# Patient Record
Sex: Male | Born: 1950 | Race: Black or African American | Hispanic: No | Smoking: Current some day smoker
Health system: Southern US, Community
[De-identification: ages and names within clinical notes are randomized; demographics above are authoritative.]

## PROBLEM LIST (undated history)

## (undated) DIAGNOSIS — I2510 Atherosclerotic heart disease of native coronary artery without angina pectoris: Secondary | ICD-10-CM

## (undated) DIAGNOSIS — I10 Essential (primary) hypertension: Secondary | ICD-10-CM

## (undated) DIAGNOSIS — F1010 Alcohol abuse, uncomplicated: Secondary | ICD-10-CM

## (undated) DIAGNOSIS — I5020 Unspecified systolic (congestive) heart failure: Secondary | ICD-10-CM

## (undated) DIAGNOSIS — I639 Cerebral infarction, unspecified: Secondary | ICD-10-CM

## (undated) DIAGNOSIS — C61 Malignant neoplasm of prostate: Secondary | ICD-10-CM

## (undated) DIAGNOSIS — E119 Type 2 diabetes mellitus without complications: Secondary | ICD-10-CM

## (undated) DIAGNOSIS — E785 Hyperlipidemia, unspecified: Secondary | ICD-10-CM

## (undated) DIAGNOSIS — F1410 Cocaine abuse, uncomplicated: Secondary | ICD-10-CM

## (undated) DIAGNOSIS — Z720 Tobacco use: Secondary | ICD-10-CM

## (undated) DIAGNOSIS — C801 Malignant (primary) neoplasm, unspecified: Secondary | ICD-10-CM

## (undated) HISTORY — DX: Type 2 diabetes mellitus without complications: E11.9

## (undated) HISTORY — DX: Hyperlipidemia, unspecified: E78.5

## (undated) HISTORY — PX: NO PAST SURGERIES: SHX2092

## (undated) HISTORY — DX: Essential (primary) hypertension: I10

---

## 2007-04-22 ENCOUNTER — Emergency Department: Payer: Self-pay | Admitting: Emergency Medicine

## 2009-03-20 ENCOUNTER — Emergency Department: Payer: Self-pay | Admitting: Emergency Medicine

## 2010-08-21 ENCOUNTER — Emergency Department (HOSPITAL_COMMUNITY)
Admission: EM | Admit: 2010-08-21 | Discharge: 2010-08-22 | Payer: Self-pay | Source: Home / Self Care | Admitting: Emergency Medicine

## 2011-01-31 ENCOUNTER — Emergency Department: Payer: Self-pay | Admitting: Internal Medicine

## 2012-07-16 ENCOUNTER — Emergency Department: Payer: Self-pay | Admitting: Emergency Medicine

## 2012-09-29 ENCOUNTER — Emergency Department: Payer: Self-pay | Admitting: Emergency Medicine

## 2012-09-29 LAB — BASIC METABOLIC PANEL
Anion Gap: 8 (ref 7–16)
BUN: 16 mg/dL (ref 7–18)
Calcium, Total: 9.1 mg/dL (ref 8.5–10.1)
Chloride: 107 mmol/L (ref 98–107)
Co2: 25 mmol/L (ref 21–32)
Osmolality: 283 (ref 275–301)
Potassium: 4.4 mmol/L (ref 3.5–5.1)

## 2012-09-29 LAB — CK TOTAL AND CKMB (NOT AT ARMC): CK, Total: 301 U/L — ABNORMAL HIGH (ref 35–232)

## 2012-09-29 LAB — CBC
HCT: 47.3 % (ref 40.0–52.0)
MCHC: 33.9 g/dL (ref 32.0–36.0)
RDW: 12.6 % (ref 11.5–14.5)

## 2012-12-26 ENCOUNTER — Emergency Department: Payer: Self-pay | Admitting: Emergency Medicine

## 2012-12-26 LAB — BASIC METABOLIC PANEL
BUN: 16 mg/dL (ref 7–18)
Calcium, Total: 8.7 mg/dL (ref 8.5–10.1)
Chloride: 100 mmol/L (ref 98–107)
Co2: 31 mmol/L (ref 21–32)
EGFR (African American): >60
EGFR (Non-African Amer.): >60
Glucose: 316 mg/dL — ABNORMAL HIGH (ref 65–99)
Potassium: 4.3 mmol/L (ref 3.5–5.1)

## 2012-12-26 LAB — CBC
HCT: 42.1 % (ref 40.0–52.0)
HGB: 14.3 g/dL (ref 13.0–18.0)
MCHC: 33.9 g/dL (ref 32.0–36.0)
MCV: 91 fL (ref 80–100)
Platelet: 220 10*3/uL (ref 150–440)
RBC: 4.65 10*6/uL (ref 4.40–5.90)
RDW: 12.6 % (ref 11.5–14.5)

## 2012-12-26 LAB — TROPONIN I: Troponin-I: <0.02 ng/mL

## 2012-12-26 LAB — CK TOTAL AND CKMB (NOT AT ARMC)
CK, Total: 215 U/L (ref 35–232)
CK-MB: 2 ng/mL (ref 0.5–3.6)

## 2013-01-24 ENCOUNTER — Emergency Department: Payer: Self-pay | Admitting: Emergency Medicine

## 2014-02-04 ENCOUNTER — Emergency Department: Payer: Self-pay | Admitting: Internal Medicine

## 2014-05-10 ENCOUNTER — Inpatient Hospital Stay: Payer: Self-pay | Admitting: Internal Medicine

## 2014-05-10 DIAGNOSIS — I6789 Other cerebrovascular disease: Secondary | ICD-10-CM

## 2014-05-10 LAB — COMPREHENSIVE METABOLIC PANEL
ALBUMIN: 3.2 g/dL — AB (ref 3.4–5.0)
ALK PHOS: 61 U/L
ALT: 19 U/L
Anion Gap: 6 — ABNORMAL LOW (ref 7–16)
BILIRUBIN TOTAL: 1.1 mg/dL — AB (ref 0.2–1.0)
BUN: 9 mg/dL (ref 7–18)
CALCIUM: 8.6 mg/dL (ref 8.5–10.1)
CO2: 24 mmol/L (ref 21–32)
Chloride: 110 mmol/L — ABNORMAL HIGH (ref 98–107)
Creatinine: 1.08 mg/dL (ref 0.60–1.30)
EGFR (African American): >60
EGFR (Non-African Amer.): >60
GLUCOSE: 157 mg/dL — AB (ref 65–99)
Osmolality: 281 (ref 275–301)
Potassium: 3.9 mmol/L (ref 3.5–5.1)
SGOT(AST): 26 U/L (ref 15–37)
SODIUM: 140 mmol/L (ref 136–145)
TOTAL PROTEIN: 7.1 g/dL (ref 6.4–8.2)

## 2014-05-10 LAB — CBC
HCT: 53.3 % — ABNORMAL HIGH (ref 40.0–52.0)
HGB: 17.6 g/dL (ref 13.0–18.0)
MCH: 31.9 pg (ref 26.0–34.0)
MCHC: 32.9 g/dL (ref 32.0–36.0)
MCV: 97 fL (ref 80–100)
Platelet: 209 10*3/uL (ref 150–440)
RBC: 5.51 10*6/uL (ref 4.40–5.90)
RDW: 13.6 % (ref 11.5–14.5)
WBC: 6.8 10*3/uL (ref 3.8–10.6)

## 2014-05-10 LAB — TROPONIN I: Troponin-I: <0.02 ng/mL

## 2014-05-11 LAB — CBC WITH DIFFERENTIAL/PLATELET
BASOS ABS: 0 10*3/uL (ref 0.0–0.1)
Basophil %: 0.6 %
Eosinophil #: 0.1 10*3/uL (ref 0.0–0.7)
Eosinophil %: 2.2 %
HCT: 52.9 % — ABNORMAL HIGH (ref 40.0–52.0)
HGB: 17.7 g/dL (ref 13.0–18.0)
LYMPHS ABS: 2.1 10*3/uL (ref 1.0–3.6)
Lymphocyte %: 31.2 %
MCH: 32.2 pg (ref 26.0–34.0)
MCHC: 33.5 g/dL (ref 32.0–36.0)
MCV: 96 fL (ref 80–100)
MONOS PCT: 7.4 %
Monocyte #: 0.5 x10 3/mm (ref 0.2–1.0)
NEUTROS ABS: 4 10*3/uL (ref 1.4–6.5)
Neutrophil %: 58.6 %
Platelet: 225 10*3/uL (ref 150–440)
RBC: 5.5 10*6/uL (ref 4.40–5.90)
RDW: 13.8 % (ref 11.5–14.5)
WBC: 6.8 10*3/uL (ref 3.8–10.6)

## 2014-05-11 LAB — HEMOGLOBIN A1C: Hemoglobin A1C: 8.1 % — ABNORMAL HIGH (ref 4.2–6.3)

## 2014-05-11 LAB — BASIC METABOLIC PANEL
Anion Gap: 7 (ref 7–16)
BUN: 10 mg/dL (ref 7–18)
CALCIUM: 8.6 mg/dL (ref 8.5–10.1)
CHLORIDE: 106 mmol/L (ref 98–107)
Co2: 26 mmol/L (ref 21–32)
Creatinine: 1.07 mg/dL (ref 0.60–1.30)
EGFR (African American): >60
Glucose: 137 mg/dL — ABNORMAL HIGH (ref 65–99)
OSMOLALITY: 279 (ref 275–301)
POTASSIUM: 3.5 mmol/L (ref 3.5–5.1)
SODIUM: 139 mmol/L (ref 136–145)

## 2014-05-11 LAB — URINALYSIS, COMPLETE
BILIRUBIN, UR: NEGATIVE
Bacteria: NONE SEEN
Glucose,UR: NEGATIVE mg/dL (ref 0–75)
Ketone: NEGATIVE
Leukocyte Esterase: NEGATIVE
Nitrite: NEGATIVE
PH: 6 (ref 4.5–8.0)
PROTEIN: NEGATIVE
RBC,UR: <1 /HPF (ref 0–5)
Specific Gravity: 1.013 (ref 1.003–1.030)
Squamous Epithelial: NONE SEEN

## 2014-05-11 LAB — LIPID PANEL
Cholesterol: 162 mg/dL (ref 0–200)
HDL Cholesterol: 37 mg/dL — ABNORMAL LOW (ref 40–60)
LDL CHOLESTEROL, CALC: 105 mg/dL — AB (ref 0–100)
Triglycerides: 98 mg/dL (ref 0–200)
VLDL CHOLESTEROL, CALC: 20 mg/dL (ref 5–40)

## 2014-05-11 IMAGING — CT CT ANGIOGRAPHY NECK
2 of 7 series · 9 of 33 positions shown · IV contrast (APPLIED)
Comparison: MRI brain [DATE].  Carotid ultrasound [DATE].

CLINICAL DATA: Left MCA infarct.  Left carotid artery occlusion.

EXAM:
CT ANGIOGRAPHY NECK
TECHNIQUE: Multidetector CT imaging of the neck was performed using the
standard protocol during bolus administration of intravenous
contrast. Multiplanar CT image reconstructions and MIPs were
obtained to evaluate the vascular anatomy. Carotid stenosis
measurements (when applicable) are obtained utilizing NASCET
criteria, using the distal internal carotid diameter as the
denominator.
CONTRAST:  80 mL Isovue 370

[Series 4: cta neck · axial · 0.43mm/px · z∈[+260,+423]mm · 5 of 245 slices shown]
[im 41/245  soft-tissue]
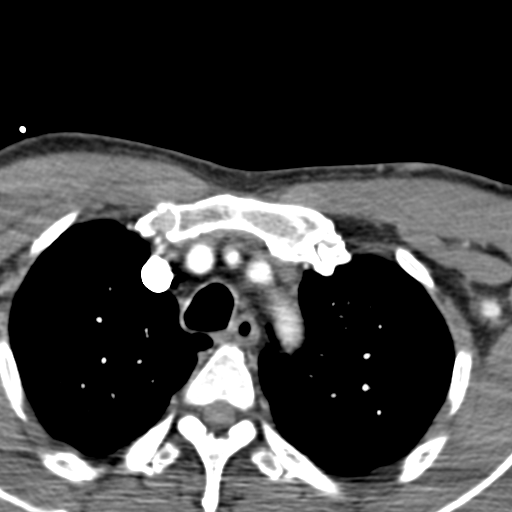
[im 82/245  bone]
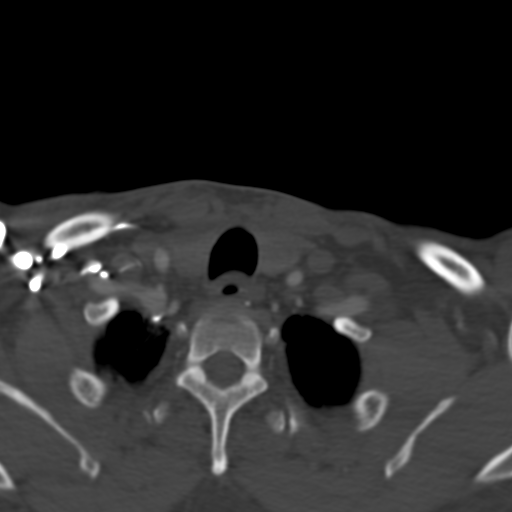
[im 123/245  soft-tissue]
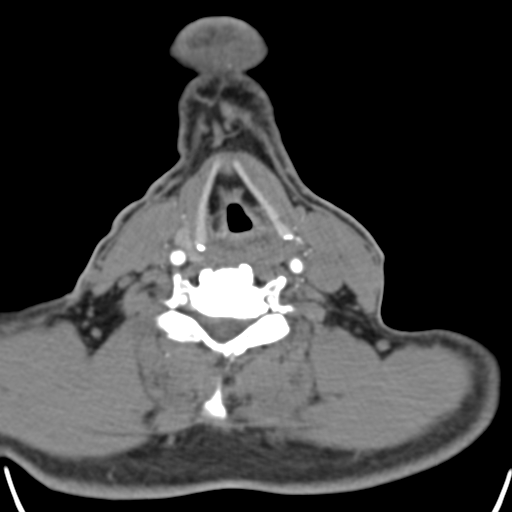
[im 163/245  bone]
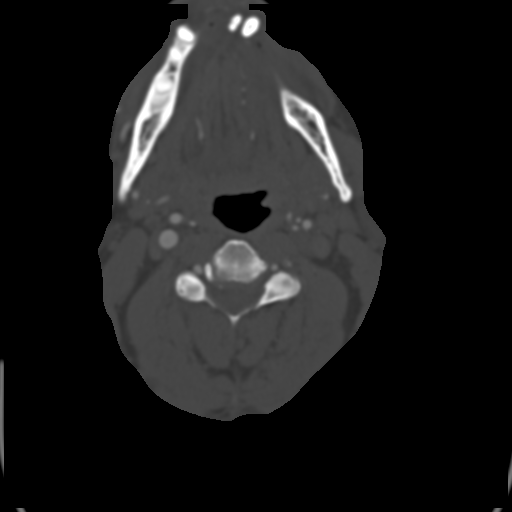
[im 204/245  soft-tissue]
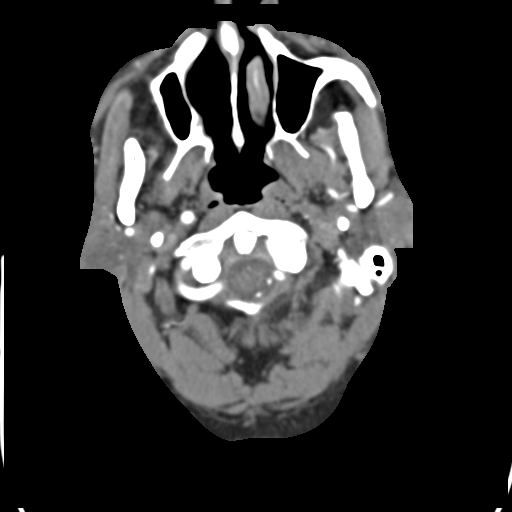

[Series 6: ax thin · axial · 0.40mm/px · z∈[+269,+414]mm · 4 of 243 slices shown]
[im 49/243  soft-tissue]
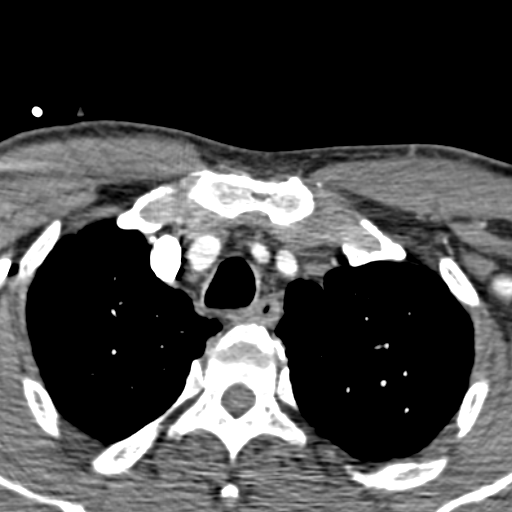
[im 97/243  soft-tissue]
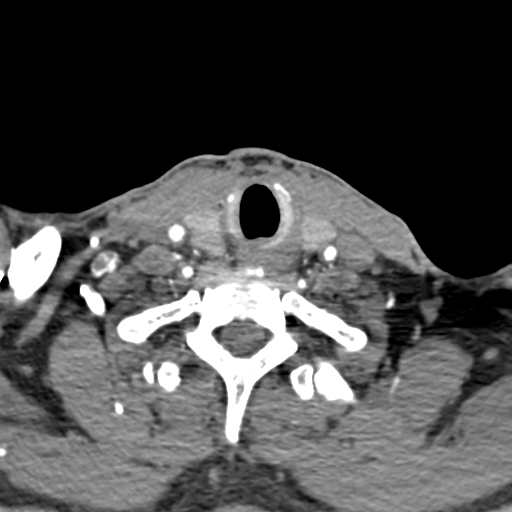
[im 146/243  soft-tissue]
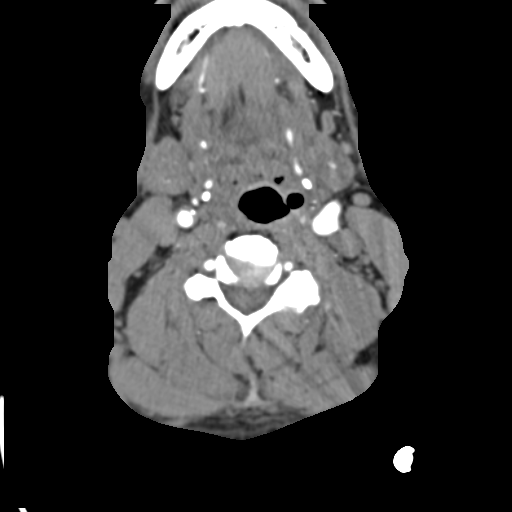
[im 194/243  soft-tissue]
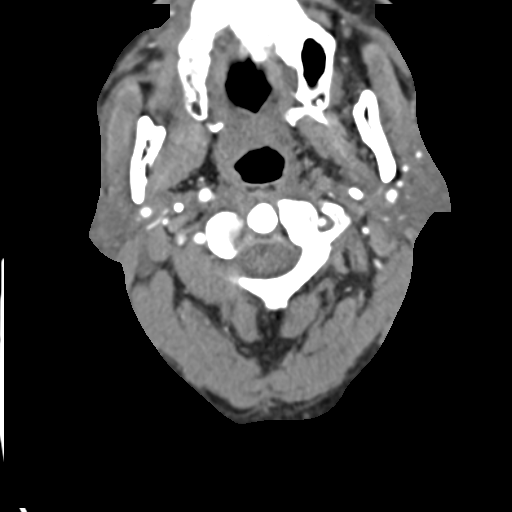

[9 of 33 positions shown; findings below may reference images not displayed]

FINDINGS: A 3 vessel arch configuration is present. There is no significant
stenosis of the great vessel origins.

The vertebral arteries originate from the subclavian arteries
bilaterally. The right vertebral artery is dominant. There is a
focal moderate stenosis at the origin of the non dominant left
vertebral artery. There is also moderate stenosis at the dural
margin of the left vertebral artery. The vertebral arteries of more
normal caliber through the neck with mild narrowing at the C6-7
level. The vertebrobasilar junction is normal.

The right common carotid artery is within normal limits.
Atherosclerotic changes are noted in the proximal right internal
carotid artery without a significant stenosis. There is mild
tortuosity of the cervical right ICA without significant stenosis.
Atherosclerotic changes are present within the cavernous carotid
arteries with moderate luminal stenosis.

The left common carotid artery is within normal limits. The left
internal carotid artery is occluded 8 mm above the bifurcation.
There is no reconstitution of the vessel within the neck or proximal
skullbase.

The soft tissues of the neck are otherwise unremarkable. No focal
mucosal or submucosal lesions are present. The visualized
intracranial contents are normal.

The lung apices demonstrate mild centrilobular emphysema.

The bone windows demonstrate mild multilevel uncovertebral spurring.
Osseous foraminal narrowing is greatest on the left at C6-7 and on
the right at C7-T1. No focal lytic or blastic lesions are present.

Review of the MIP images confirms the above findings.
IMPRESSION: 1. From the left internal carotid artery is occluded without
evidence for reconstitution in the neck or proximal skullbase.
2. Moderate stenosis of the distal right internal carotid artery
secondary to calcified atherosclerotic disease within the cavernous
segments.
3. Mild atherosclerotic changes in the proximal right internal
carotid artery without significant stenosis.
4. Moderate stenoses at the origin of the left vertebral artery in
at the dural margin of the left vertebral artery.
5. The right vertebral artery is the dominant vessel. The
vertebrobasilar junction is unremarkable.
6. Mild centrilobular emphysema.
7. Spondylosis of the cervical spine as described.

## 2015-01-26 NOTE — Consult Note (Signed)
Referring Physician:  Epifanio Lesches :   Primary Care Physician:  Epifanio Lesches : [facility] [place], [facility], [address], [place], [place] [address]  Reason for Consult: Admit Date: 10-May-2014  Chief Complaint: R sided weakness  Reason for Consult: CVA   History of Present Illness: History of Present Illness:   64 yo RHD M presents to [facility] secondary to onset of R sided weakness.  Pt denies numbness or speech changes.  Pt denies vision changes.  Per pt, this has happened several times before but gets better.  This time was a little different because it persisted longer and pt is not used to this.  Pt has not seen a doctor in over 10 years.  ROS:  General denies complaints   HEENT no complaints   Lungs no complaints   Cardiac no complaints   GI no complaints   GU no complaints   Musculoskeletal no complaints   Extremities no complaints   Skin no complaints   Neuro no complaints   Endocrine no complaints   Psych no complaints   Past Medical/Surgical Hx:  Denies medical history:   Diabetes:   Past Medical/ Surgical Hx:  Past Medical History DM, tob   Past Surgical History none   Home Medications: Medication Instructions Last Modified Date/Time  metFORMIN 500 mg oral tablet 1 tab(s) orally 2 times a day 07-Aug-15 14:05  aspirin 81 mg oral delayed release tablet 1 tab(s) orally once a day 07-Aug-15 14:05  clopidogrel 75 mg oral tablet 1 tab(s) orally once a day 07-Aug-15 14:05  atorvastatin 20 mg oral tablet 1 tab(s) orally once a day 07-Aug-15 14:05   Allergies:  No Known Allergies:   Allergies:  Allergies NKDA   Social/Family History: Employment Status: currently employed  Lives With: children  Living Arrangements: apartment  Social History: + tob, no EtOH, no illicits  Family History: no stroke, no CAD   Vital Signs: **Vital Signs.:   07-Aug-15 11:30  Vital Signs Type Routine  Temperature  Temperature (F) 97.5  Celsius 36.3  Temperature Source oral  Pulse Pulse 62  Respirations Respirations 18  Systolic BP Systolic BP 132  Diastolic BP (mmHg) Diastolic BP (mmHg) 78  Mean BP 92  Pulse Ox % Pulse Ox % 98  Pulse Ox Activity Level  At rest  Oxygen Delivery Room Air/ 21 %   Physical Exam: General: thin, NAD  HEENT: normocephalic, sclera nonicteric, oropharynx clear  Neck: supple, no JVD, no bruits  Chest: CTA B, no wheezing, good movement  Cardiac: RRR, no murmurs, no edema, 2+ pulses  Extremities: no C/C/E, FROM   Neurologic Exam: Mental Status: alert and oriented x 3, normal language, follows complex commands, trace dysarthria  Cranial Nerves: PERRLA, EOMI, nl VF, face symmetric, tongue midline, shoulder shrug equal  Motor Exam: mild drift in R UE otherwise 5 /5, nl tone  Deep Tendon Reflexes: 2+/4 B, plantars downgoing B, no Hoffman  Sensory Exam: pinprick, temperature, and vibration intact B  Coordination: nl FTN and HTS WNL, nl RAM, nl gait   Lab Results: LabObservation:  06-Aug-15 14:41   OBSERVATION Reason for Test  Hepatic:  06-Aug-15 09:37   Bilirubin, Total  1.1  Alkaline Phosphatase 61 (46-116 NOTE: New Reference Range 04/24/14)  SGPT (ALT) 19 (14-63 NOTE: New Reference Range 04/24/14)  SGOT (AST) 26  Total Protein, Serum 7.1  Albumin, Serum  3.2  Routine Chem:  07-Aug-15 05:07   Glucose, Serum  137  BUN  10  Creatinine (comp) 1.07  Sodium, Serum 139  Potassium, Serum 3.5  Chloride, Serum 106  CO2, Serum 26  Calcium (Total), Serum 8.6  Anion Gap 7  Osmolality (calc) 279  eGFR (African American) >60  eGFR (Non-African American) >60 (eGFR values <87m/min/1.73 m2 may be an indication of chronic kidney disease (CKD). Calculated eGFR is useful in patients with stable renal function. The eGFR calculation will not be reliable in acutely ill patients when serum creatinine is changing rapidly. It is not useful in  patients on dialysis. The  eGFR calculation may not be applicable to patients at the low and high extremes of body sizes, pregnant women, and vegetarians.)  Hemoglobin A1c ([facility])  8.1 (The American Diabetes Association recommends that a primary goal of therapy should be <7% and that physicians should reevaluate the treatment regimen in patients with HbA1c values consistently >8%.)  Cholesterol, Serum 162  Triglycerides, Serum 98  HDL (INHOUSE)  37  VLDL Cholesterol Calculated 20  LDL Cholesterol Calculated  105 (Result(s) reported on 11 May 2014 at 05:47AM.)  Cardiac:  06-Aug-15 09:37   Troponin I < 0.02 (0.00-0.05 0.05 ng/mL or less: NEGATIVE  Repeat testing in 3-6 hrs  if clinically indicated. >0.05 ng/mL: POTENTIAL  MYOCARDIAL INJURY. Repeat  testing in 3-6 hrs if  clinically indicated. NOTE: An increase or decrease  of 30% or more on serial  testing suggests a  clinically important change)  Routine UA:  06-Aug-15 23:47   Color (UA) Yellow  Clarity (UA) Clear  Glucose (UA) Negative  Bilirubin (UA) Negative  Ketones (UA) Negative  Specific Gravity (UA) 1.013  Blood (UA) 1+  pH (UA) 6.0  Protein (UA) Negative  Nitrite (UA) Negative  Leukocyte Esterase (UA) Negative (Result(s) reported on 11 May 2014 at 12:21AM.)  RBC (UA) <1 /HPF  WBC (UA) 1 /HPF  Bacteria (UA) NONE SEEN  Epithelial Cells (UA) NONE SEEN  Mucous (UA) PRESENT (Result(s) reported on 11 May 2014 at 12:21AM.)  Routine Hem:  07-Aug-15 05:07   WBC (CBC) 6.8  RBC (CBC) 5.50  Hemoglobin (CBC) 17.7  Hematocrit (CBC)  52.9  Platelet Count (CBC) 225  MCV 96  MCH 32.2  MCHC 33.5  RDW 13.8  Neutrophil % 58.6  Lymphocyte % 31.2  Monocyte % 7.4  Eosinophil % 2.2  Basophil % 0.6  Neutrophil # 4.0  Lymphocyte # 2.1  Monocyte # 0.5  Eosinophil # 0.1  Basophil # 0.0 (Result(s) reported on 11 May 2014 at 05:40AM.)   Radiology Results: [place]    06-Aug-15 14:31, [place] Doppler Bilateral  [place] Doppler Bilateral   REASON  FOR EXAM:    acute stroke  COMMENTS:       PROCEDURE: [place] - [place]OTID DOPPLER BILATERAL  - May 10 2014  2:31PM     CLINICAL DATA:  Acute stroke    EXAM:  BILATERAL CAROTID DUPLEX ULTRASOUND    TECHNIQUE:  GPearline Cablesscale imaging, color Doppler andduplex ultrasound were  performed of bilateral carotid and vertebral arteries in the neck.    COMPARISON:  Head CT 05/10/2014  FINDINGS:  Criteria: Quantification of carotid stenosis is based on velocity  parameters that correlate the residual internal carotid diameter  with NASCET-based stenosis levels, using the diameter of the distal  internal carotid lumen as the denominator for stenosis measurement.    The following velocity measurements were obtained:    RIGHT    ICA:  54/25 cm/sec    CCA:62/18 cm/sec  SYSTOLIC ICA/CCA RATIO:   0.9  DIASTOLIC ICA/CCA RATIO:  1.4    ECA:  170 cm/sec    LEFT    ICA:  Occluded cm/sec    CCA:  91/7.9 cm/sec    SYSTOLIC ICA/CCA RATIO:  Not applicable    DIASTOLIC ICA/CCA RATIO:  Not applicable    ECA:  53 cm/sec  RIGHT CAROTID ARTERY: Mild heterogeneous but predominantly  hypoechoic atherosclerotic plaque in the carotid bulb and proximal  internal carotid artery. By peak systolic velocity criteria, the  estimated stenosis remains less than 50%.    RIGHT VERTEBRAL ARTERY:  Patent with normal antegrade flow.    LEFT CAROTID ARTERY: Diffuse intimal medial thickening throughout  the common carotid artery. Abnormal waveform with externalization of  the common carotid signal. Diastolic flow is a decreased. There is a  flush occlusion at the origin of the internal carotid artery. No  flow or string sign appreciated.    LEFT VERTEBRAL ARTERY:  Patent with normal antegrade flow.   IMPRESSION:  1. 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 No evidence of internal flow or string sign.  2. Mild heterogeneous but predominantly hypoechoic plaque on  the  right results in less than 50% diameter right ICA stenosis.  3. Vertebral arteries are patent with normal antegrade flow.  Signed,    Criselda Peaches, MD    [facility]    [facility]    Electronically Signed    By: Jacqulynn Cadet M.D.    On: 05/10/2014 14:41         Verified By: Criselda Peaches, M.D.,  MRI:    06-Aug-15 16:22, MRI Brain Without Contrast  MRI Brain Without Contrast   REASON FOR EXAM:    acute stroke  COMMENTS:       PROCEDURE: MR  - MR BRAIN WO CONTRAST  - May 10 2014  4:22PM     CLINICAL DATA:  Right-sided weakness.  Blackout spells.    EXAM:  MRI HEAD WITHOUT CONTRAST    TECHNIQUE:  Multiplanar, multiecho pulsesequences of the brain and surrounding  structures were obtained without intravenous contrast.    COMPARISON:  CT head and carotid sonography, performed earlier  today.    FINDINGS:  There is a moderately large area of watershed infarction affecting  the LEFT anterior/superior, frontal and posterior frontal lobe  involving the cortex and subcortical white matter between the  anterior and middle cerebral artery territories. A small focus of  acute infarction also involves the LEFT caudate, slightly less  typical but still consistent with additional border zone type  infarct.    No RIGHT hemisphere or posterior circulation restricted diffusion.  No hemorrhage, mass lesion, hydrocephalus, or extra-axial fluid.  Normal for age cerebral volume. Nodefinite white matter disease.  Partial empty sella.  There is absent flow related enhancement in the petrous, cavernous,  and supraclinoid LEFT ICA. This was predicted from carotid  ultrasound. LEFT MCA appears patent. The RIGHT ICA, basilar, both  vertebrals appear patent. Negative orbits and mastoids. Complete  fluid accumulation in the RIGHT frontal sinus does not appear  grossly infected based on diffusion signal.      IMPRESSION:  Acute LEFT hemisphere watershed infarct affecting the border zones  in the frontal lobe between the anterior middle cerebral arteries.  The LEFT caudate is also affected. There is no hemorrhage.    There is no flow related enhancement in the skull  base and  supraclinoid LEFT ICA consistent with occlusion.  Electronically Signed    By: Rolla Flatten M.D.    On: 05/10/2014 16:49         Verified By: Staci Righter, M.D.,  CT:    06-Aug-15 09:50, CT Head Without Contrast  CT Head Without Contrast   REASON FOR EXAM:    CVA  COMMENTS:   May transport without cardiac monitor    PROCEDURE: CT  - CT HEAD WITHOUT CONTRAST  - May 10 2014  9:50AM     CLINICAL DATA:  Blackout spells. Patient reports that his in right  side is not working well.    EXAM:  CT HEAD WITHOUT CONTRAST    TECHNIQUE:  Contiguous axial images were obtained from the base of the skull  through the vertex without intravenous contrast.  COMPARISON:  None.    FINDINGS:  There is relatively well-defined hypoattenuation that extendsfrom  the deep white matter of the left antral lateral frontal lobe to the  cortical medullary margin of the anterior superior left frontal  lobe. It measures approximately 2.9 cm x 2.8 cm in greatest  transverse dimension. There is loss the corticalmedullary junction.  This is most suggestive of a late subacute infarct. An area of near  fluid attenuation lies within this consistent with a smaller area of  chronic infarction. Other areas of chronic infarction are noted  along the posterior superior left frontal lobe, which extends to the  precentral gyrus.    No other evidence of recent infarction.  Ventricles are normal in size and configuration. There are no  parenchymal masses or mass effect.    No extra-axial masses or abnormal fluid collections.    There is no intracranial hemorrhage.    Opacified right frontal sinus. Remaining visualized sinuses are  clear  as are the mastoid air cells.     IMPRESSION:  1. Probable late subacute infarct involving the anterolateral left  frontal lobe. There are superimposed and adjacent areas of chronic  infarction as detailed above.  2. No other evidence of a recent abnormality. No intracranial  hemorrhage.      Electronically Signed    By: Lajean Manes M.D.    On: 05/10/2014 09:58         Verified By: Lasandra Beech, M.D.,   Radiology Impression: Radiology Impression: MRI of brain personally reviewed by me and shows acute L MCA watershed and old infarcts too   Impression/Recommendations: Recommendations:   prior notes reviewed by me reviewed by me   L MCA acute and chronic watershed infarcts-  mildly symptomatic, etiology is likely low blood flow from L carotid occlusion L carotid occlusion-  symptomatic and chronic R carotid stenosis-  may be symptomatic due to the fact that pt gets entire anterior circulation from this vessel plus collaterals agree with ASA 77m and plavix agree with low dose statin pt needs to stop smoking immediately would allow BP to ride on the higher end SBP 130-150 ideally needs to follow up with vascular surgery for the R carotid stenosis can be discharge, should f/u with Neurology in 6-8 weeks  Electronic Signatures: SJamison Neighbor(MD)  (Signed 07-Aug-15 14:24)  Authored: REFERRING PHYSICIAN, Primary Care Physician, Consult, History of Present Illness, Review of Systems, PAST MEDICAL/SURGICAL HISTORY, HOME MEDICATIONS, ALLERGIES, Social/Family History, NURSING VITAL SIGNS, Physical Exam-, LAB RESULTS, RADIOLOGY RESULTS, Recommendations   Last Updated: 07-Aug-15 14:24 by SJamison Neighbor(MD)

## 2015-01-26 NOTE — H&P (Signed)
PATIENT NAME:  Allen Massey, VI MR#:  [id number] DATE OF BIRTH:  11-30-1950  DATE OF ADMISSION:  05/10/2014  PRIMARY CARE PHYSICIAN: None.   EMERGENCY ROOM PHYSICIAN: Dr. Corky Downs  CHIEF COMPLAINT: Right-sided weakness.  HISTORY OF PRESENT ILLNESS: The patient is a 64 year old male with no past medical problems, comes in because of right-sided weakness. The patient started to have right-sided weakness yesterday afternoon going to right arm and right leg and dragging of right leg. The patient also noticed some slurred speech. Denies any dizziness. No headache. No loss of consciousness. No numbness. The patient's weakness in the right side still persists. Because of that he came. The patient's CT of head showed acute left-sided infarct so we are admitting him for acute stroke. The patient is able to talk and denies any trouble swallowing.   PAST MEDICAL HISTORY: No hypertension. No diabetes.  ALLERGIES: No known drug allergies.   SOCIAL HISTORY: Smokes half pack per day. No drugs. Occasional alcohol. The patient lives with a friend. Works for [employer].   PAST SURGICAL HISTORY: None.   FAMILY HISTORY: No hypertension or diabetes.   MEDICATIONS: None.   REVIEW OF SYSTEMS: CONSTITUTIONAL: No fever. No fatigue.  EYES: Denies any blurred vision or glaucoma.  ENT: Denies any trouble swallowing or hearing. Denies any epistaxis. Denies any trouble swallowing.  NECK: Denies any neck pain. No thyroid enlargement.  CARDIOVASCULAR: Denies any chest pain, palpitations, or syncope.  PULMONARY: Denies any trouble breathing. No history of COPD. GASTROINTESTINAL: Denies any nausea or vomiting.  GENITOURINARY: Denies any dysuria.  ENDOCRINE: No polyuria or polydipsia.  NEUROLOGIC: No history of stroke.  PSYCHIATRIC: No anxiety or insomnia.   PHYSICAL EXAMINATION: VITAL SIGNS: Temperature 98.5, heart rate 100, blood pressure 156/81 and sats 97% on room air. GENERAL: The patient is alert, awake and  oriented, a 64 year old male not in distress, answering questions appropriately. HEAD: Atraumatic, normocephalic.  EYES: Pupils equally reacting to light. Extraocular movements are intact.  ENT: No tympanic membrane congestion. No turbinate hypertrophy. No oropharyngeal erythema. NECK: Supple. No JVD. No carotid bruit.  HEART: S1, S2 regular. No murmurs.  LUNGS: Clear to auscultation. No wheeze. No rales.  VASCULAR: Good pedal pulses present.  ABDOMEN: Soft, nontender, nondistended. Bowel sounds present.  EXTREMITIES: No cyanosis. Able to move all extremities.  SKIN: Warm and dry.  LYMPHATIC: No lymphadenopathy in cervical or axillary region.  NEUROLOGIC: Alert, awake, and oriented. Cranial nerves II through XII are intact. Power slightly decreased on the right upper extremity but 5/5 in the rest of the extremities, left upper extremity and both lower legs. Sensation is intact. DTRs 2+ bilaterally. Finger nose test is intact. PSYCHIATRIC: Mood and affect are within normal limits.   DIAGNOSTIC DATA: Chest x-ray shows minimal bilateral infrahilar atelectasis without acute cardiopulmonary disease.   CT head shows late subacute infarct of anterolateral left frontal lobe. The patient has no evidence of hemorrhage.  WBC 6.8,   hematocrit 53.3, platelets 209,000.  Electrolytes: Sodium 140, potassium 3.9, chloride 110, bicarbonate 25, BUN 9, creatinine 1.08, glucose 157. LFTs within normal limits. Troponin less than 0.02.   EKG: Normal sinus rhythm with no ST-T changes.   ASSESSMENT AND PLAN:  1.  The patient is a 64 year old male patient with acute stroke with right-sided weakness. Admit him to hospitalist service, on telemetry. Continue him on aspirin, get physical therapy evaluation, obtain carotid ultrasound, echocardiogram, MRI of the brain and fasting lipids to finish the stroke work-up. Most likely he can be discharged home  because he does not have much neurological deficit.  2.  Smoking  abuse, tobacco abuse. I counseled him against smoking for 3 minutes. The patient says he wants to quit.  3.  Elevated sugar, rule out diabetes. Check hemoglobin A1c.  CODE STATUS: FULL.  We will need to set up a primary doctor for him for followups.  TIME SPENT ON HISTORY AND PHYSICAL: 60 minutes.   ____________________________ Epifanio Lesches, MD sk:sb D: 05/10/2014 11:30:20 ET T: 05/10/2014 12:10:25 ET JOB#: [id number]  cc: Epifanio Lesches, MD, <Dictator> Epifanio Lesches MD ELECTRONICALLY SIGNED 05/28/2014 14:46

## 2015-01-26 NOTE — Discharge Summary (Signed)
PATIENT NAME:  Allen Massey, Allen Massey MR#:  [id number] DATE OF BIRTH:  10-Feb-1951  DATE OF ADMISSION:  05/10/2014 DATE OF DISCHARGE:  05/11/2014  ADMITTING PHYSICIAN:  Epifanio Lesches, MD   DISCHARGING PHYSICIAN:  Gladstone Lighter, MD   PRIMARY CARE PHYSICIAN:  None, an appointment with the PCP will be set up prior to discharge:   [place]:  Neurology consultation by Jamison Neighbor, MD   DISCHARGE DIAGNOSES: 1.  Acute left cerebrovascular accident.  2.  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  3.  Tobacco use disorder.  4.  Diabetes mellitus.   DISCHARGE HOME MEDICATIONS:  1.  Aspirin 81 mg p.o. daily.  2.  Plavix 75 mg p.o. daily.  3.  Atorvastatin 20 mg p.o. daily.   DISCHARGE DIET:  Low sodium.    DISCHARGE ACTIVITY:  As tolerated.   FOLLOWUP INSTRUCTIONS: 1.  Neurology follow-up in 2 to 3 weeks.  2.  PCP follow-up in 2 to 3 weeks.  3.  Outpatient physical and occupational therapy for right hand weakness.   LABORATORY AND IMAGING STUDIES PRIOR TO DISCHARGE:  1.  WBC is 6.8, hemoglobin 17.7, hematocrit 52.9, platelet count 225,000.  2.  Sodium 139, potassium 3.5, chloride 106, bicarbonate 26, BUN 10, creatinine 1.07, glucose 137, and calcium of 8.6, HbA1c is 8.1.  3.  LDL cholesterol 105, HDL 37, total cholesterol 162, triglycerides of 98; urinalysis negative for any infection.  4.  MRI of the brain showing acute left hemisphere watershed infarct affecting the border zones in the left frontal lobe between anterior, middle cerebral arteries; left caudate also affected; no hemorrhage, there is left ICA flow enhancement and consistent with occlusion in the brain.  5.  Echocardiogram Doppler showing LV ejection fraction to be 50% to 55%, low normal, global LV systolic function, impaired relaxation, concentric LVH noted.  6.  Ultrasound Dopplers of the carotid showing complete, likely chronic, total occlusion of left  internal carotid artery, no evidence of internal flow, mild heterogeneous but predominantly hypoechoic plaque on the right side less than 50% occlusion, vertebral arteries are patent.  7.  ALT 19, AST 26, alkaline phosphatase 61, total bilirubin 1.1, albumin of 3.2.  8.  CT angiography of the carotids showing left internal carotid artery occlusion without evidence of reconstitution in the neck or proximal skull base; moderate stenosis of distal right internal carotid artery secondary to calcified atherosclerotic disease within the cavernous segments; moderate stenosis at the origin of left vertebral artery at the dural margin of the left vertebral artery; right vertebral artery is the dominant vessel; spondylosis of the spine is also noted.   BRIEF HOSPITAL COURSE:  Allen Massey is a 64 year old African American male with no significant past medical history other than smoking, presents to the hospital secondary to right arm weakness and also right leg weakness of 1 day duration.  His CT showed acute left-sided infarct.  1.  Acute cerebrovascular accident. MRI also confirming acute left infarct in the left frontal, in the anterior cerebral and middle cerebral artery territory and watershed infarct. The patient has significant atherosclerotic stenosis, a complete occlusion of the left internal carotid confirmed by CTA, which could have been cause for the acute stroke. He was started on aspirin and Plavix and statin and strongly recommended to stop smoking. He was seen by neurologist in the hospital.  2.  Left carotid artery occlusion which could have been the cause for his stroke. He also has  chronic infarct seen on the MRI; however, his right side also shows almost a significant calcified  atherosclerotic plaque disease that needs to be intervened; vascular was consulted; Dr. Lucky Cowboy will  follow up with the patient in 2 weeks. The right side needs to be intervened sooner so he could avoid strokes on the right side  as well.  3.  Diabetes mellitus. The patient will be started on metformin to be started after 48 hours with his CT angiogram being done; his HbA1c is elevated at 8.3.  4.  The patient does not have any hypertension. He actually needs systolic to be in the range of 150s to maintain perfusion to the brain with his left carotid occlusion.  5.  His course has been uneventful in the hospital. He worked well with physical therapy and they recommended outpatient services.   DISCHARGE CONDITION:  Stable.   DISCHARGE DISPOSITION:  Home with outpatient physical therapy services.   TIME SPENT ON DISCHARGE:  Was 45 minutes.    ____________________________ Gladstone Lighter, MD rk:nt D: 05/11/2014 13:55:04 ET T: 05/11/2014 20:15:16 ET JOB#: [id number]  cc: Gladstone Lighter, MD, <Dictator> Gladstone Lighter MD ELECTRONICALLY SIGNED 05/22/2014 14:14

## 2015-03-24 ENCOUNTER — Encounter: Payer: Self-pay | Admitting: Emergency Medicine

## 2015-03-24 ENCOUNTER — Emergency Department
Admission: EM | Admit: 2015-03-24 | Discharge: 2015-03-24 | Disposition: A | Discharge status: Home / Self Care | Payer: Self-pay | Attending: Emergency Medicine | Admitting: Emergency Medicine

## 2015-03-24 DIAGNOSIS — S39012A Strain of muscle, fascia and tendon of lower back, initial encounter: Secondary | ICD-10-CM | POA: Insufficient documentation

## 2015-03-24 DIAGNOSIS — Y998 Other external cause status: Secondary | ICD-10-CM | POA: Insufficient documentation

## 2015-03-24 DIAGNOSIS — X58XXXA Exposure to other specified factors, initial encounter: Secondary | ICD-10-CM | POA: Insufficient documentation

## 2015-03-24 DIAGNOSIS — Z720 Tobacco use: Secondary | ICD-10-CM | POA: Insufficient documentation

## 2015-03-24 DIAGNOSIS — Y9389 Activity, other specified: Secondary | ICD-10-CM | POA: Insufficient documentation

## 2015-03-24 DIAGNOSIS — Y9289 Other specified places as the place of occurrence of the external cause: Secondary | ICD-10-CM | POA: Insufficient documentation

## 2015-03-24 LAB — URINALYSIS COMPLETE WITH MICROSCOPIC (ARMC ONLY)
BACTERIA UA: NONE SEEN
BILIRUBIN URINE: NEGATIVE
Glucose, UA: 50 mg/dL — AB
KETONES UR: NEGATIVE mg/dL
LEUKOCYTES UA: NEGATIVE
Nitrite: NEGATIVE
Protein, ur: NEGATIVE mg/dL
SQUAMOUS EPITHELIAL / LPF: NONE SEEN
Specific Gravity, Urine: 1.019 (ref 1.005–1.030)
pH: 5 (ref 5.0–8.0)

## 2015-03-24 MED ORDER — NAPROXEN 500 MG PO TABS
500.0000 mg | ORAL_TABLET | Freq: Two times a day (BID) | ORAL | Status: DC
  Administered 2015-03-24: 500 mg via ORAL

## 2015-03-24 MED ORDER — NAPROXEN 500 MG PO TABS
ORAL_TABLET | ORAL | Status: AC
  Filled 2015-03-24: qty 1

## 2015-03-24 MED ORDER — NAPROXEN 500 MG PO TBEC: 500.0000 mg | DELAYED_RELEASE_TABLET | Freq: Two times a day (BID) | ORAL | Status: DC

## 2015-03-24 NOTE — ED Provider Notes (Signed)
[facility] Emergency Department Provider Note ____________________________________________  Time seen: 1520  I have reviewed the triage vital signs and the nursing notes.  HISTORY  Chief Complaint  Hip Pain  HPI Allen Massey is a 64 y.o. male visits to the ED with right hip pain for the last 3 weeks. 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 He describes the pain is worsened with movement. The pain does not radiate from the right lower back, and he denies incontinence, leg weakness, or foot drop.  He has not dosed any OTC pain medicines in the interim for pain relief. He describes the pain as constant and dull, with an occasional sharp flare. He rates the pain at an 8/10 on exam.  History reviewed. No pertinent past medical history.  There are no active problems to display for this patient.  History reviewed. No pertinent past surgical history.  Current Outpatient Rx  Name  Route  Sig  Dispense  Refill  . naproxen (EC NAPROSYN) 500 MG EC tablet   Oral   Take 1 tablet (500 mg total) by mouth 2 (two) times daily with a meal.   30 tablet   0     Allergies Review of patient's allergies indicates no known allergies.  No family history on file.  Social History History  Substance Use Topics  . Smoking status: Current Every Day Smoker  . Smokeless tobacco: Never Used  . Alcohol Use: Yes   Review of Systems  Constitutional: Negative for fever. Eyes: Negative for visual changes. ENT: Negative for sore throat. Cardiovascular: Negative for chest pain. Respiratory: Negative for shortness of breath. Gastrointestinal: Negative for abdominal pain, vomiting and diarrhea. Genitourinary: Negative for dysuria. Musculoskeletal: Positive for back pain. Skin: Negative for rash. Neurological: Negative for headaches, focal weakness or numbness. ____________________________________________  PHYSICAL  EXAM:  VITAL SIGNS: ED Triage Vitals  Enc Vitals Group     BP 03/24/15 1426 127/79 mmHg     Pulse Rate 03/24/15 1426 97     Resp 03/24/15 1426 18     Temp 03/24/15 1426 98.4 F (36.9 C)     Temp Source 03/24/15 1426 Oral     SpO2 03/24/15 1426 98 %     Weight 03/24/15 1426 180 lb (81.647 kg)     Height 03/24/15 1426 5\' 3"  (1.6 m)     Head Cir --      Peak Flow --      Pain Score --      Pain Loc --      Pain Edu? --      Excl. in [place]? --    Constitutional: Alert and oriented. Well appearing and in no distress. HEENT: Normocephalic and atraumatic. Conjunctivae are normal. PERRL. Normal extraocular movements. Cardiovascular: Normal rate, regular rhythm.  Respiratory: Normal respiratory effort. No wheezes/rales/rhonchi. Gastrointestinal: Soft and nontender. No distention. Musculoskeletal: Nontender with normal range of motion in all extremities.  Neurologic:  Normal gait without ataxia. Normal speech and language. No gross focal neurologic deficits are appreciated. Skin:  Skin is warm, dry and intact. No rash noted. Psychiatric: Mood and affect are normal. Patient exhibits appropriate insight and judgment. ____________________________________________    LABS (pertinent positives/negatives) Labs Reviewed  URINALYSIS COMPLETEWITH MICROSCOPIC ([facility] ONLY) - Abnormal; Notable for the following:    Color, Urine YELLOW (*)    APPearance CLEAR (*)    Glucose, UA 50 (*)  Hgb urine dipstick 1+ (*)    All other components within normal limits  ___________________________   RADIOLOGY deferred ____________________________________________  PROCEDURES  Naproxen 500 mg PO ____________________________________________  INITIAL IMPRESSION / ASSESSMENT AND PLAN / ED COURSE  Mechanical low back pain after strain related to work. Discussed the reproducible nature of his musculoskeletal pain without evidence of neuromuscular deficit or vascular etiology. Will defer imaging at this time  and treat with naproxen.  Follow-up with [facility] or return as needed.   ____________________________________________  FINAL CLINICAL IMPRESSION(S) / ED DIAGNOSES  Final diagnoses:  Lumbar strain, initial encounter     Melvenia Needles, PA-C 03/24/15 1730  Earleen Newport, MD 03/24/15 [phone]

## 2015-03-24 NOTE — ED Notes (Signed)
Pt in right hip that started 3 weeks ago. Pt states he was lifting heavy wood. Movement makes the pain worse.

## 2015-03-24 NOTE — Discharge Instructions (Signed)
Lumbosacral Strain Lumbosacral strain is a strain of any of the parts that make up your lumbosacral vertebrae. Your lumbosacral vertebrae are the bones that make up the lower third of your backbone. Your lumbosacral vertebrae are held together by muscles and tough, fibrous tissue (ligaments).  CAUSES  A sudden blow to your back can cause lumbosacral strain. Also, anything that causes an excessive stretch of the muscles in the low back can cause this strain. This is typically seen when people exert themselves strenuously, fall, lift heavy objects, bend, or crouch repeatedly. RISK FACTORS  Physically demanding work.  Participation in pushing or pulling sports or sports that require a sudden twist of the back (tennis, golf, baseball).  Weight lifting.  Excessive lower back curvature.  Forward-tilted pelvis.  Weak back or abdominal muscles or both.  Tight hamstrings. SIGNS AND SYMPTOMS  Lumbosacral strain may cause pain in the area of your injury or pain that moves (radiates) down your leg.  DIAGNOSIS Your health care provider can often diagnose lumbosacral strain through a physical exam. In some cases, you may need tests such as X-ray exams.  TREATMENT  Treatment for your lower back injury depends on many factors that your clinician will have to evaluate. However, most treatment will include the use of anti-inflammatory medicines. HOME CARE INSTRUCTIONS   Avoid hard physical activities (tennis, racquetball, waterskiing) if you are not in proper physical condition for it. This may aggravate or create problems.  If you have a back problem, avoid sports requiring sudden body movements. Swimming and walking are generally safer activities.  Maintain good posture.  Maintain a healthy weight.  For acute conditions, you may put ice on the injured area.  Put ice in a plastic bag.  Place a towel between your skin and the bag.  Leave the ice on for 20 minutes, 2-3 times a day.  When the  low back starts healing, stretching and strengthening exercises may be recommended. SEEK MEDICAL CARE IF:  Your back pain is getting worse.  You experience severe back pain not relieved with medicines. SEEK IMMEDIATE MEDICAL CARE IF:   You have numbness, tingling, weakness, or problems with the use of your arms or legs.  There is a change in bowel or bladder control.  You have increasing pain in any area of the body, including your belly (abdomen).  You notice shortness of breath, dizziness, or feel faint.  You feel sick to your stomach (nauseous), are throwing up (vomiting), or become sweaty.  You notice discoloration of your toes or legs, or your feet get very cold. MAKE SURE YOU:   Understand these instructions.  Will watch your condition.  Will get help right away if you are not doing well or get worse. Document Released: 07/01/2005 Document Revised: 09/26/2013 Document Reviewed: 05/10/2013 [facility] Patient Information 2015 [place], [place]. This information is not intended to replace advice given to you by your health care provider. Make sure you discuss any questions you have with your health care provider.  Take the pain medicine as directed for back pain. Follow-up with your provider or [facility] for continued symptoms. Modify work activities as needed.

## 2015-03-24 NOTE — ED Notes (Signed)
Pt with right hip pain x1week , no injury

## 2017-01-25 ENCOUNTER — Emergency Department
Admission: EM | Admit: 2017-01-25 | Discharge: 2017-01-25 | Disposition: A | Discharge status: Home / Self Care | Payer: Medicare Other | Attending: Emergency Medicine | Admitting: Emergency Medicine

## 2017-01-25 ENCOUNTER — Emergency Department: Payer: Medicare Other

## 2017-01-25 ENCOUNTER — Encounter: Payer: Self-pay | Admitting: Emergency Medicine

## 2017-01-25 DIAGNOSIS — F17200 Nicotine dependence, unspecified, uncomplicated: Secondary | ICD-10-CM | POA: Insufficient documentation

## 2017-01-25 DIAGNOSIS — K2970 Gastritis, unspecified, without bleeding: Secondary | ICD-10-CM | POA: Insufficient documentation

## 2017-01-25 DIAGNOSIS — R079 Chest pain, unspecified: Secondary | ICD-10-CM | POA: Diagnosis present

## 2017-01-25 LAB — CBC
HCT: 51.9 % (ref 40.0–52.0)
Hemoglobin: 17.4 g/dL (ref 13.0–18.0)
MCH: 31.1 pg (ref 26.0–34.0)
MCHC: 33.6 g/dL (ref 32.0–36.0)
MCV: 92.7 fL (ref 80.0–100.0)
Platelets: 197 10*3/uL (ref 150–440)
RBC: 5.61 MIL/uL (ref 4.40–5.90)
RDW: 13.1 % (ref 11.5–14.5)
WBC: 9.9 10*3/uL (ref 3.8–10.6)

## 2017-01-25 LAB — BASIC METABOLIC PANEL
Anion gap: 9 (ref 5–15)
BUN: 13 mg/dL (ref 6–20)
CALCIUM: 9.1 mg/dL (ref 8.9–10.3)
CO2: 25 mmol/L (ref 22–32)
Chloride: 100 mmol/L — ABNORMAL LOW (ref 101–111)
Creatinine, Ser: 1.26 mg/dL — ABNORMAL HIGH (ref 0.61–1.24)
GFR calc Af Amer: >60 mL/min (ref >60)
GFR, EST NON AFRICAN AMERICAN: 58 mL/min — AB (ref >60)
GLUCOSE: 336 mg/dL — AB (ref 65–99)
Potassium: 4.1 mmol/L (ref 3.5–5.1)
Sodium: 134 mmol/L — ABNORMAL LOW (ref 135–145)

## 2017-01-25 LAB — TROPONIN I

## 2017-01-25 MED ORDER — FAMOTIDINE 40 MG PO TABS: 40.0000 mg | ORAL_TABLET | Freq: Every evening | ORAL | 1 refills | Status: DC

## 2017-01-25 MED ORDER — GI COCKTAIL ~~LOC~~
30.0000 mL | Freq: Once | ORAL | Status: AC
  Administered 2017-01-25: 30 mL via ORAL
  Filled 2017-01-25: qty 30

## 2017-01-25 MED ORDER — SUCRALFATE 1 G PO TABS: 1.0000 g | ORAL_TABLET | Freq: Four times a day (QID) | ORAL | 0 refills | Status: DC

## 2017-01-25 NOTE — ED Triage Notes (Signed)
Pt reports centralized intermittent chest pain since Friday with burning sensation and a lot of belching. Pt reports increased pain after eating. Pt reports having all teeth removed about 2 months ago and hasn't been able to chew food very well.

## 2017-01-25 NOTE — Discharge Instructions (Signed)
Please seek medical attention for any high fevers, chest pain, shortness of breath, change in behavior, persistent vomiting, bloody stool or any other new or concerning symptoms.  

## 2017-01-25 NOTE — ED Provider Notes (Signed)
[facility] Emergency Department Provider Note   ____________________________________________   I have reviewed the triage vital signs and the nursing notes.   HISTORY  Chief Complaint Chest Pain   History limited by: Not Limited   HPI Allen Massey is a 66 y.o. male who presents to the emergency department today because of concerns for chest pain. He states it is located in the center chest. It does radiate from his epigastric region up into his chest. He describes it as burning. It has been present for the past 3 days. Somewhat worse after eating. Patient has not had any associated shortness of breath. No fevers. Patient denies similar symptoms in the past.   History reviewed. No pertinent past medical history.  There are no active problems to display for this patient.   No past surgical history on file.  Prior to Admission medications   Medication Sig Start Date End Date Taking? Authorizing Provider  naproxen (EC NAPROSYN) 500 MG EC tablet Take 1 tablet (500 mg total) by mouth 2 (two) times daily with a meal. 03/24/15   Jenise V Bacon Menshew, PA-C    Allergies Patient has no known allergies.  No family history on file.  Social History Social History  Substance Use Topics  . Smoking status: Current Every Day Smoker  . Smokeless tobacco: Never Used  . Alcohol use Yes    Review of Systems Constitutional: No fever/chills Eyes: No visual changes. ENT: No sore throat. Cardiovascular: Positive chest pain. Respiratory: Denies shortness of breath. Gastrointestinal: No abdominal pain.  No nausea, no vomiting.  No diarrhea.   Genitourinary: Negative for dysuria. Musculoskeletal: Negative for back pain. Skin: Negative for rash. Neurological: Negative for headaches, focal weakness or numbness.  ____________________________________________   PHYSICAL EXAM:  VITAL SIGNS: ED Triage Vitals [01/25/17 1030]  Enc Vitals Group     BP 140/65      Pulse Rate 99     Resp 20     Temp 98.5 F (36.9 C)     Temp Source Oral     SpO2 99 %     Weight 160 lb (72.6 kg)     Height 5\' 6"  (1.676 m)     Head Circumference      Peak Flow      Pain Score 8    Constitutional: Alert and oriented. Well appearing and in no distress. Eyes: Conjunctivae are normal. Normal extraocular movements. ENT   Head: Normocephalic and atraumatic.   Nose: No congestion/rhinnorhea.   Mouth/Throat: Mucous membranes are moist.   Neck: No stridor. Hematological/Lymphatic/Immunilogical: No cervical lymphadenopathy. Cardiovascular: Normal rate, regular rhythm.  No murmurs, rubs, or gallops. Respiratory: Normal respiratory effort without tachypnea nor retractions. Breath sounds are clear and equal bilaterally. No wheezes/rales/rhonchi. Gastrointestinal: Soft and non tender. No rebound. No guarding.  Genitourinary: Deferred Musculoskeletal: Normal range of motion in all extremities. No lower extremity edema. Neurologic:  Normal speech and language. No gross focal neurologic deficits are appreciated.  Skin:  Skin is warm, dry and intact. No rash noted. Psychiatric: Mood and affect are normal. Speech and behavior are normal. Patient exhibits appropriate insight and judgment.  ____________________________________________    LABS (pertinent positives/negatives)  Labs Reviewed  BASIC METABOLIC PANEL - Abnormal; Notable for the following:       Result Value   Sodium 134 (*)    Chloride 100 (*)    Glucose, Bld 336 (*)    Creatinine, Ser 1.26 (*)    GFR calc non Af  Amer 58 (*)    All other components within normal limits  CBC  TROPONIN I     ____________________________________________   EKG I, Nance Pear, attending physician, personally viewed and interpreted this EKG  EKG Time: 1028 Rate: 93 Rhythm: normal sinus rhythm Axis: normal Intervals: qtc 452 QRS: narrow, LVH ST changes: no st elevation Impression: abnormal  ekg  ____________________________________________    RADIOLOGY  CXR IMPRESSION:  No acute abnormality noted.       ___________________________________________   PROCEDURES  Procedures  ____________________________________________   INITIAL IMPRESSION / ASSESSMENT AND PLAN / ED COURSE  Pertinent labs & imaging results that were available during my care of the patient were reviewed by me and considered in my medical decision making (see chart for details).  Patient presented to the emergency department today because of concerns for central chest pain which she describes as burning. Patient's EKG without any acute findings. Troponin was negative. Given the length of symptoms I would expect some elevation if the patient was truly suffering from ACS. The patient did feel better after gi cocktail. At this point think likely gastritis related. Will plan on discharging with antacid and sucralfate. Will give primary care follow up information.  ____________________________________________   FINAL CLINICAL IMPRESSION(S) / ED DIAGNOSES  Final diagnoses:  Gastritis without bleeding, unspecified chronicity, unspecified gastritis type     Note: This dictation was prepared with Dragon dictation. Any transcriptional errors that result from this process are unintentional     Nance Pear, MD 01/25/17 1524

## 2018-07-25 ENCOUNTER — Telehealth: Payer: Self-pay | Admitting: *Deleted

## 2018-07-25 ENCOUNTER — Encounter: Payer: Self-pay | Admitting: *Deleted

## 2018-07-25 DIAGNOSIS — Z122 Encounter for screening for malignant neoplasm of respiratory organs: Secondary | ICD-10-CM

## 2018-07-25 NOTE — Telephone Encounter (Signed)
Received a referral for initial lung cancer screening scan.  Contacted the patient and obtained their smoking history,current smoker 1-2 cigarettes a day with 48 pkyr history  as well as answering questions related to screening process.  Patient denies signs of lung cancer such as weight loss or hemoptysis at this time.  Patient denies comorbidity that would prevent curative treatment if lung cancer were found.  Patient is scheduled for the Shared Decision Making Visit and CT scan on 08-18-18@1345 .

## 2018-08-15 ENCOUNTER — Other Ambulatory Visit: Payer: Self-pay | Admitting: Ophthalmology

## 2018-08-15 DIAGNOSIS — H3582 Retinal ischemia: Secondary | ICD-10-CM

## 2018-08-18 ENCOUNTER — Ambulatory Visit
Admission: RE | Admit: 2018-08-18 | Discharge: 2018-08-18 | Disposition: A | Discharge status: Home / Self Care | Payer: Medicare Other | Source: Ambulatory Visit | Attending: Nurse Practitioner | Admitting: Nurse Practitioner

## 2018-08-18 ENCOUNTER — Inpatient Hospital Stay: Payer: Medicare Other | Attending: Nurse Practitioner | Admitting: Oncology

## 2018-08-18 DIAGNOSIS — I700 Atherosclerosis of aorta: Secondary | ICD-10-CM | POA: Insufficient documentation

## 2018-08-18 DIAGNOSIS — J438 Other emphysema: Secondary | ICD-10-CM | POA: Diagnosis not present

## 2018-08-18 DIAGNOSIS — Z87891 Personal history of nicotine dependence: Secondary | ICD-10-CM | POA: Diagnosis not present

## 2018-08-18 DIAGNOSIS — I2510 Atherosclerotic heart disease of native coronary artery without angina pectoris: Secondary | ICD-10-CM | POA: Diagnosis not present

## 2018-08-18 DIAGNOSIS — Z122 Encounter for screening for malignant neoplasm of respiratory organs: Secondary | ICD-10-CM

## 2018-08-18 DIAGNOSIS — F17210 Nicotine dependence, cigarettes, uncomplicated: Secondary | ICD-10-CM | POA: Insufficient documentation

## 2018-08-18 DIAGNOSIS — J432 Centrilobular emphysema: Secondary | ICD-10-CM | POA: Diagnosis not present

## 2018-08-18 NOTE — Progress Notes (Signed)
In accordance with CMS guidelines, patient has met eligibility criteria including age, absence of signs or symptoms of lung cancer.  Social History   Tobacco Use  . Smoking status: Current Every Day Smoker    Packs/day: 1.00    Years: 48.00    Pack years: 48.00    Types: Cigarettes  . Smokeless tobacco: Never Used  Substance Use Topics  . Alcohol use: Yes  . Drug use: No     A shared decision-making session was conducted prior to the performance of CT scan. This includes one or more decision aids, includes benefits and harms of screening, follow-up diagnostic testing, over-diagnosis, false positive rate, and total radiation exposure.  Counseling on the importance of adherence to annual lung cancer LDCT screening, impact of co-morbidities, and ability or willingness to undergo diagnosis and treatment is imperative for compliance of the program.  Counseling on the importance of continued smoking cessation for former smokers; the importance of smoking cessation for current smokers, and information about tobacco cessation interventions have been given to patient including [employer] and [employer] quit [place] programs.  Written order for lung cancer screening with LDCT has been given to the patient and any and all questions have been answered to the best of my abilities.   Yearly follow up will be coordinated by Shawn Perkins, Thoracic Navigator.  Jennifer Burns, NP 08/18/2018 2:51 PM  

## 2018-08-19 ENCOUNTER — Encounter: Payer: Self-pay | Admitting: *Deleted

## 2018-08-19 ENCOUNTER — Ambulatory Visit: Payer: Medicare Other

## 2018-08-22 ENCOUNTER — Ambulatory Visit
Admission: RE | Admit: 2018-08-22 | Discharge: 2018-08-22 | Disposition: A | Discharge status: Home / Self Care | Payer: Medicare Other | Source: Ambulatory Visit | Attending: Ophthalmology | Admitting: Ophthalmology

## 2018-08-22 DIAGNOSIS — I6523 Occlusion and stenosis of bilateral carotid arteries: Secondary | ICD-10-CM | POA: Diagnosis not present

## 2018-08-22 DIAGNOSIS — H3582 Retinal ischemia: Secondary | ICD-10-CM

## 2019-05-15 ENCOUNTER — Ambulatory Visit (INDEPENDENT_AMBULATORY_CARE_PROVIDER_SITE_OTHER): Payer: Medicare Other | Admitting: Vascular Surgery

## 2019-05-15 ENCOUNTER — Other Ambulatory Visit: Payer: Self-pay

## 2019-05-15 ENCOUNTER — Encounter (INDEPENDENT_AMBULATORY_CARE_PROVIDER_SITE_OTHER): Payer: Self-pay | Admitting: Vascular Surgery

## 2019-05-15 ENCOUNTER — Encounter (INDEPENDENT_AMBULATORY_CARE_PROVIDER_SITE_OTHER): Payer: Self-pay

## 2019-05-15 VITALS — BP 136/83 | HR 84 | Resp 16 | Ht 66.0 in | Wt 169.8 lb

## 2019-05-15 DIAGNOSIS — E782 Mixed hyperlipidemia: Secondary | ICD-10-CM | POA: Diagnosis not present

## 2019-05-15 DIAGNOSIS — I6529 Occlusion and stenosis of unspecified carotid artery: Secondary | ICD-10-CM | POA: Insufficient documentation

## 2019-05-15 DIAGNOSIS — I70219 Atherosclerosis of native arteries of extremities with intermittent claudication, unspecified extremity: Secondary | ICD-10-CM | POA: Diagnosis not present

## 2019-05-15 DIAGNOSIS — E1151 Type 2 diabetes mellitus with diabetic peripheral angiopathy without gangrene: Secondary | ICD-10-CM

## 2019-05-15 DIAGNOSIS — I10 Essential (primary) hypertension: Secondary | ICD-10-CM | POA: Insufficient documentation

## 2019-05-15 DIAGNOSIS — E119 Type 2 diabetes mellitus without complications: Secondary | ICD-10-CM | POA: Insufficient documentation

## 2019-05-15 DIAGNOSIS — I6523 Occlusion and stenosis of bilateral carotid arteries: Secondary | ICD-10-CM

## 2019-05-15 NOTE — Progress Notes (Addendum)
MRN : [id number]  Allen Massey is a 68 y.o. (Oct 25, 1950) male who presents with chief complaint of No chief complaint on file. Marland Kitchen  History of Present Illness:   The patient is seen for evaluation of carotid stenosis. The carotid stenosis was identified after a duplex ultrasound dated 08/22/2018.  <50% RICA stenosis was noted with occlusion of the LICA (notation made this was a known finding)  The patient denies amaurosis fugax. There is no recent history of TIA symptoms or focal motor deficits. There is no prior documented CVA.  There is no history of migraine headaches. There is no history of seizures.   The patient is seen for evaluation of painful lower extremities and diminished pulses. Patient notes the pain is always associated with activity and is very consistent day today. Typically, the pain occurs at less than one block, progress is as activity continues to the point that the patient must stop walking. Resting including standing still for several minutes allowed resumption of the activity and the ability to walk a similar distance before stopping again. Uneven terrain and inclined shorten the distance. The patient states the difficulty walking is not having a profound negative impact on quality of life and daily activities.  The patient denies rest pain or dangling of an extremity off the side of the bed during the night for relief. No open wounds or sores at this time. No prior interventions or surgeries.  There is a history of back problems and DJD of the lumbar sacral spine.   The patient is taking enteric-coated aspirin 81 mg daily.  The patient has a history of coronary artery disease, no recent episodes of angina or shortness of breath.  There is a history of hyperlipidemia which is being treated with a statin.    No outpatient medications have been marked as taking for the 05/15/19 encounter (Appointment) with Delana Meyer, Dolores Lory, MD.    No past medical history on  file.  No past surgical history on file.  Social History Social History   Tobacco Use   Smoking status: Current Every Day Smoker    Packs/day: 1.00    Years: 48.00    Pack years: 48.00    Types: Cigarettes   Smokeless tobacco: Never Used  Substance Use Topics   Alcohol use: Yes   Drug use: No    Family History No family history on file. No family history of bleeding/clotting disorders, porphyria or autoimmune disease   No Known Allergies   REVIEW OF SYSTEMS (Negative unless checked)  Constitutional: [] Weight loss  [] Fever  [] Chills Cardiac: [] Chest pain   [] Chest pressure   [] Palpitations   [] Shortness of breath when laying flat   [] Shortness of breath with exertion. Vascular:  [x] Pain in legs with walking   [] Pain in legs at rest  [] History of DVT   [] Phlebitis   [] Swelling in legs   [] Varicose veins   [] Non-healing ulcers Pulmonary:   [] Uses home oxygen   [] Productive cough   [] Hemoptysis   [] Wheeze  [] COPD   [] Asthma Neurologic:  [] Dizziness   [] Seizures   [] History of stroke   [] History of TIA  [] Aphasia   [] Vissual changes   [] Weakness or numbness in arm   [] Weakness or numbness in leg Musculoskeletal:   [] Joint swelling   [x] Joint pain   [x] Low back pain Hematologic:  [] Easy bruising  [] Easy bleeding   [] Hypercoagulable state   [] Anemic Gastrointestinal:  [] Diarrhea   [] Vomiting  [] Gastroesophageal reflux/heartburn   [] Difficulty swallowing.  Genitourinary:  [] Chronic kidney disease   [] Difficult urination  [] Frequent urination   [] Blood in urine Skin:  [] Rashes   [] Ulcers  Psychological:  [] History of anxiety   []  History of major depression.  Physical Examination  There were no vitals filed for this visit. There is no height or weight on file to calculate BMI. Gen: WD/WN, NAD Head: [place]/AT, No temporalis wasting.  Ear/Nose/Throat: Hearing grossly intact, nares w/o erythema or drainage, poor dentition Eyes: PER, EOMI, sclera nonicteric.  Neck: Supple, no  masses.  No bruit or JVD.  Pulmonary:  Good air movement, clear to auscultation bilaterally, no use of accessory muscles.  Cardiac: RRR, normal S1, S2, no Murmurs. Vascular: no carotid bruits Vessel Right Left  Radial Palpable Palpable  Carotid Palpable Palpable  PT Not Palpable Not Palpable  DP Not Palpable Not Palpable  Gastrointestinal: soft, non-distended. No guarding/no peritoneal signs.  Musculoskeletal: M/S 5/5 throughout.  No deformity or atrophy.  Neurologic: CN 2-12 intact. Pain and light touch intact in extremities.  Symmetrical.  Speech is fluent. Motor exam as listed above. Psychiatric: Judgment intact, Mood & affect appropriate for pt's clinical situation. Dermatologic: No rashes or ulcers noted.  No changes consistent with cellulitis. Lymph : No Cervical lymphadenopathy, no lichenification or skin changes of chronic lymphedema.  CBC Lab Results  Component Value Date   WBC 9.9 01/25/2017   HGB 17.4 01/25/2017   HCT 51.9 01/25/2017   MCV 92.7 01/25/2017   PLT 197 01/25/2017    BMET    Component Value Date/Time   NA 134 (L) 01/25/2017 1030   NA 139 05/11/2014 0507   K 4.1 01/25/2017 1030   K 3.5 05/11/2014 0507   CL 100 (L) 01/25/2017 1030   CL 106 05/11/2014 0507   CO2 25 01/25/2017 1030   CO2 26 05/11/2014 0507   GLUCOSE 336 (H) 01/25/2017 1030   GLUCOSE 137 (H) 05/11/2014 0507   BUN 13 01/25/2017 1030   BUN 10 05/11/2014 0507   CREATININE 1.26 (H) 01/25/2017 1030   CREATININE 1.07 05/11/2014 0507   CALCIUM 9.1 01/25/2017 1030   CALCIUM 8.6 05/11/2014 0507   GFRNONAA 58 (L) 01/25/2017 1030   GFRNONAA >60 05/11/2014 0507   GFRAA >60 01/25/2017 1030   GFRAA >60 05/11/2014 0507   CrCl cannot be calculated (Patient's most recent lab result is older than the maximum 21 days allowed.).  COAG No results found for: INR, PROTIME  Radiology No results found.   Assessment/Plan 1. Bilateral carotid artery stenosis Recommend:  Given the patient's  asymptomatic subcritical stenosis no further invasive testing or surgery at this time.  Prior duplex ultrasound shows RICA <30% and known occlusion of the LICA.  Continue antiplatelet therapy as prescribed Continue management of CAD, HTN and Hyperlipidemia Healthy heart diet,  encouraged exercise at least 4 times per week Follow up in 1 month with duplex ultrasound and physical exam   - VAS US CAROTID; Future  2. 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  The patient does not voice lifestyle limiting changes at this point in time.  Noninvasive studies do not suggest clinically significant change.  No invasive studies, angiography or surgery at this time The patient should continue walking and begin a more formal exercise program.  The patient should continue antiplatelet therapy and aggressive treatment of the lipid abnormalities  No changes in the patient's medications at this time  The patient should continue wearing graduated compression socks 10-15 mmHg strength to control the mild edema.   - VAS [place] ABI WITH/WO TBI; Future - Ambulatory referral to Podiatry  3. Essential hypertension Continue antihypertensive medications as already ordered, these medications have been reviewed and there are no changes at this time.   4. Type 2 diabetes mellitus with diabetic peripheral angiopathy without gangrene, without long-term current use of insulin ([place]) Continue hypoglycemic medications as already ordered, these medications have been reviewed and there are no changes at this time.  Hgb A1C to be monitored as already arranged by primary service  - Ambulatory referral to Podiatry  5. Mixed hyperlipidemia Continue statin as ordered and reviewed, no changes at this time    Hortencia Pilar, MD  05/15/2019 8:49 AM

## 2019-05-16 NOTE — Addendum Note (Signed)
Addended by: Katha Cabal on: 05/16/2019 02:06 PM   Modules accepted: Orders

## 2019-05-31 ENCOUNTER — Encounter (INDEPENDENT_AMBULATORY_CARE_PROVIDER_SITE_OTHER): Payer: Medicare Other

## 2019-05-31 ENCOUNTER — Ambulatory Visit (INDEPENDENT_AMBULATORY_CARE_PROVIDER_SITE_OTHER): Payer: Medicare Other | Admitting: Nurse Practitioner

## 2019-06-07 ENCOUNTER — Ambulatory Visit (INDEPENDENT_AMBULATORY_CARE_PROVIDER_SITE_OTHER): Payer: Medicare Other

## 2019-06-07 ENCOUNTER — Ambulatory Visit (INDEPENDENT_AMBULATORY_CARE_PROVIDER_SITE_OTHER): Payer: Medicare Other | Admitting: Nurse Practitioner

## 2019-06-07 ENCOUNTER — Other Ambulatory Visit: Payer: Self-pay

## 2019-06-07 ENCOUNTER — Encounter (INDEPENDENT_AMBULATORY_CARE_PROVIDER_SITE_OTHER): Payer: Self-pay | Admitting: Nurse Practitioner

## 2019-06-07 VITALS — BP 124/81 | HR 65 | Resp 16 | Wt 169.0 lb

## 2019-06-07 DIAGNOSIS — I6523 Occlusion and stenosis of bilateral carotid arteries: Secondary | ICD-10-CM

## 2019-06-07 DIAGNOSIS — I10 Essential (primary) hypertension: Secondary | ICD-10-CM

## 2019-06-07 DIAGNOSIS — E1151 Type 2 diabetes mellitus with diabetic peripheral angiopathy without gangrene: Secondary | ICD-10-CM | POA: Diagnosis not present

## 2019-06-07 DIAGNOSIS — I70219 Atherosclerosis of native arteries of extremities with intermittent claudication, unspecified extremity: Secondary | ICD-10-CM

## 2019-06-08 ENCOUNTER — Encounter (INDEPENDENT_AMBULATORY_CARE_PROVIDER_SITE_OTHER): Payer: Self-pay | Admitting: Nurse Practitioner

## 2019-06-08 NOTE — Progress Notes (Signed)
SUBJECTIVE:  Patient ID: Allen Massey, male    DOB: May 09, 1951, 68 y.o.   MRN: [id number] Chief Complaint  Patient presents with  . Follow-up    ultrasound follow up    HPI  Allen Massey is a 68 y.o. male The patient is seen for follow up evaluation of carotid stenosis. The carotid stenosis followed by ultrasound.   The patient denies amaurosis fugax. There is no recent history of TIA symptoms or focal motor deficits. There is no prior documented CVA.  The patient is taking enteric-coated aspirin 81 mg daily.  There is no history of migraine headaches. There is no history of seizures.  The patient has a history of coronary artery disease, no recent episodes of angina or shortness of breath. There is a history of hyperlipidemia which is being treated with a statin.    Patient is also following up for his peripheral artery disease.  There have been no interval changes in lower extremity symptoms. No interval shortening of the patient's claudication distance or development of rest pain symptoms. No new ulcers or wounds have occurred since the last visit.  There have been no significant changes to the patient's overall health care.  The patient denies amaurosis fugax or recent TIA symptoms. There are no recent neurological changes noted. The patient denies history of DVT, PE or superficial thrombophlebitis. The patient denies recent episodes of angina or shortness of breath.   ABI Rt=0.73 and Lt=0.65  (no previous ABIs) Duplex ultrasound of the tibial arteries shows monophasic waveforms bilaterally  Carotid Duplex done today shows a known occlusion of the left internal carotid artery with a 1 to 39% stenosis of the right.  No change compared to last study in 08/22/2018  Past Medical History:  Diagnosis Date  . Diabetes mellitus without complication ([place])   . Hyperlipidemia   . Hypertension     Past Surgical History:  Procedure Laterality Date  . NO PAST SURGERIES       Social History   Socioeconomic History  . Marital status: Married    Spouse name: Not on file  . Number of children: Not on file  . Years of education: Not on file  . Highest education level: Not on file  Occupational History  . Not on file  Social Needs  . Financial resource strain: Not on file  . Food insecurity    Worry: Not on file    Inability: Not on file  . Transportation needs    Medical: Not on file    Non-medical: Not on file  Tobacco Use  . Smoking status: Current Every Day Smoker    Packs/day: 1.00    Years: 48.00    Pack years: 48.00    Types: Cigarettes  . Smokeless tobacco: Never Used  Substance and Sexual Activity  . Alcohol use: Yes  . Drug use: No  . Sexual activity: Not on file  Lifestyle  . Physical activity    Days per week: Not on file    Minutes per session: Not on file  . Stress: Not on file  Relationships  . Social Herbalist on phone: Not on file    Gets together: Not on file    Attends religious service: Not on file    Active member of club or organization: Not on file    Attends meetings of clubs or organizations: Not on file    Relationship status: Not on file  . Intimate partner violence  Fear of current or ex partner: Not on file    Emotionally abused: Not on file    Physically abused: Not on file    Forced sexual activity: Not on file  Other Topics Concern  . Not on file  Social History Narrative  . Not on file    Family History  Problem Relation Age of Onset  . Diabetes Brother   . Kidney disease Brother     No Known Allergies   Review of Systems   Review of Systems: Negative Unless Checked Constitutional: [] Weight loss  [] Fever  [] Chills Cardiac: [] Chest pain   []  Atrial Fibrillation  [] Palpitations   [] Shortness of breath when laying flat   [] Shortness of breath with exertion. [] Shortness of breath at rest Vascular:  [] Pain in legs with walking   [] Pain in legs with standing [] Pain in legs when laying  flat   [] Claudication    [] Pain in feet when laying flat    [] History of DVT   [] Phlebitis   [] Swelling in legs   [] Varicose veins   [] Non-healing ulcers Pulmonary:   [] Uses home oxygen   [] Productive cough   [] Hemoptysis   [] Wheeze  [] COPD   [] Asthma Neurologic:  [] Dizziness   [] Seizures  [] Blackouts [] History of stroke   [] History of TIA  [] Aphasia   [] Temporary Blindness   [] Weakness or numbness in arm   [] Weakness or numbness in leg Musculoskeletal:   [] Joint swelling   [] Joint pain   [] Low back pain  []  History of Knee Replacement [] Arthritis [] back Surgeries  []  Spinal Stenosis    Hematologic:  [] Easy bruising  [] Easy bleeding   [] Hypercoagulable state   [] Anemic Gastrointestinal:  [] Diarrhea   [] Vomiting  [] Gastroesophageal reflux/heartburn   [] Difficulty swallowing. [] Abdominal pain Genitourinary:  [] Chronic kidney disease   [] Difficult urination  [] Anuric   [] Blood in urine [] Frequent urination  [] Burning with urination   [] Hematuria Skin:  [] Rashes   [] Ulcers [] Wounds Psychological:  [] History of anxiety   []  History of major depression  []  Memory Difficulties      OBJECTIVE:   Physical Exam  BP 124/81 (BP Location: Right Arm)   Pulse 65   Resp 16   Wt 169 lb (76.7 kg)   BMI 27.28 kg/m   Gen: WD/WN, NAD Head: [place]/AT, No temporalis wasting.  Ear/Nose/Throat: Hearing grossly intact, nares w/o erythema or drainage Eyes: PER, EOMI, sclera nonicteric.  Neck: Supple, no masses.  No JVD.  Pulmonary:  Good air movement, no use of accessory muscles.  Cardiac: RRR Vascular:  Vessel Right Left  Radial Palpable Palpable  Brachial Palpable Palpable  Femoral Palpable Palpable  Popliteal Palpable Palpable  Dorsalis Pedis Palpable Palpable  Posterior Tibial Palpable Palpable   Gastrointestinal: soft, non-distended. No guarding/no peritoneal signs.  Musculoskeletal: M/S 5/5 throughout.  No deformity or atrophy.  Neurologic: Pain and light touch intact in extremities.  Symmetrical.   Speech is fluent. Motor exam as listed above. Psychiatric: Judgment intact, Mood & affect appropriate for pt's clinical situation. Dermatologic: No Venous rashes. No Ulcers Noted.  No changes consistent with cellulitis. Lymph : No Cervical lymphadenopathy, no lichenification or skin changes of chronic lymphedema.       ASSESSMENT AND PLAN:  1. Bilateral carotid artery stenosis The patient is seen for follow up evaluation of carotid stenosis. The carotid stenosis followed by ultrasound.   The patient denies amaurosis fugax. There is no recent history of TIA symptoms or focal motor deficits. There is no prior documented CVA.  The  patient is taking enteric-coated aspirin 81 mg daily.  There is no history of migraine headaches. There is no history of seizures.  The patient has a history of coronary artery disease, no recent episodes of angina or shortness of breath. The patient denies PAD or claudication symptoms. There is a history of hyperlipidemia which is being treated with a statin.    Carotid Duplex done today shows a known occlusion of the left internal carotid artery with a 1 to 39% stenosis of the right.  No change compared to last study in 08/22/2018. - VAS US CAROTID; Future  2. 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  The patient does not voice lifestyle limiting changes at this point in time.  Noninvasive studies do not suggest clinically significant change.  No invasive studies, angiography or surgery at this time The patient should continue walking and begin a more formal exercise program.  The patient should continue antiplatelet therapy and aggressive treatment of the lipid abnormalities  No changes in the patient's medications at this time  The patient should continue wearing graduated compression socks 10-15 mmHg strength to  control the mild edema.   - VAS [place] ABI WITH/WO TBI; Future  3. Essential hypertension Continue antihypertensive medications as already ordered, these medications have been reviewed and there are no changes at this time.   4. Type 2 diabetes mellitus with diabetic peripheral angiopathy without gangrene, without long-term current use of insulin ([place]) Continue hypoglycemic medications as already ordered, these medications have been reviewed and there are no changes at this time.  Hgb A1C to be monitored as already arranged by primary service    Current Outpatient Medications on File Prior to Visit  Medication Sig Dispense Refill  . aspirin EC 81 MG tablet Take 81 mg by mouth daily.    Marland Kitchen atorvastatin (LIPITOR) 40 MG tablet Take 40 mg by mouth daily.     . dorzolamide-timolol (COSOPT) 22.3-6.8 MG/ML ophthalmic solution 2 (two) times daily.     Marland Kitchen latanoprost (XALATAN) 0.005 % ophthalmic solution Place into both eyes at bedtime.     Marland Kitchen losartan (COZAAR) 25 MG tablet daily.     . metFORMIN (GLUCOPHAGE) 1000 MG tablet 1,000 mg 2 (two) times daily with a meal.     . sucralfate (CARAFATE) 1 g tablet Take 1 tablet (1 g total) by mouth 4 (four) times daily. 60 tablet 0  . famotidine (PEPCID) 40 MG tablet Take 1 tablet (40 mg total) by mouth every evening. 30 tablet 1  . naproxen (EC NAPROSYN) 500 MG EC tablet Take 1 tablet (500 mg total) by mouth 2 (two) times daily with a meal. (Patient not taking: Reported on 05/15/2019) 30 tablet 0   No current facility-administered medications on file prior to visit.     There are no Patient Instructions on file for this visit. No follow-ups on file.   Kris Hartmann, NP  This note was completed with Sales executive.  Any errors are purely unintentional.

## 2019-07-05 ENCOUNTER — Ambulatory Visit (INDEPENDENT_AMBULATORY_CARE_PROVIDER_SITE_OTHER): Payer: Medicare Other | Admitting: Urology

## 2019-07-05 ENCOUNTER — Other Ambulatory Visit: Payer: Self-pay

## 2019-07-05 ENCOUNTER — Encounter: Payer: Self-pay | Admitting: Urology

## 2019-07-05 VITALS — BP 136/72 | HR 84 | Ht 66.0 in | Wt 167.0 lb

## 2019-07-05 DIAGNOSIS — I6523 Occlusion and stenosis of bilateral carotid arteries: Secondary | ICD-10-CM

## 2019-07-05 DIAGNOSIS — R972 Elevated prostate specific antigen [PSA]: Secondary | ICD-10-CM | POA: Diagnosis not present

## 2019-07-05 LAB — URINALYSIS, COMPLETE
Bilirubin, UA: NEGATIVE
Glucose, UA: NEGATIVE
Ketones, UA: NEGATIVE
Leukocytes,UA: NEGATIVE
Nitrite, UA: NEGATIVE
Specific Gravity, UA: 1.02 (ref 1.005–1.030)
Urobilinogen, Ur: 0.2 mg/dL (ref 0.2–1.0)
pH, UA: 5 (ref 5.0–7.5)

## 2019-07-05 LAB — MICROSCOPIC EXAMINATION: Bacteria, UA: NONE SEEN

## 2019-07-05 NOTE — Progress Notes (Signed)
07/05/2019 4:59 PM   Allen Massey 1951/07/29 [id number]  Referring provider: Marguerita Merles, [place] [facility] [place],   [id number]  Chief Complaint  Patient presents with  . Elevated PSA    HPI: 68 year old male referred for further evaluation of elevated PSA.  Patient underwent routine annual labs on 06/01/2019 at which time his PSA was noted to be 6.2.  It was 4.8 in 06/2018.  We have no additional PSA data for comparison.  He denies any urologic evaluation in the past.  He denies ever being told that his PSA was elevated in the past.  He denies any urinary symptoms today including no dysuria, gross hematuria, urgency frequency.  He gets up twice at night to void which is not bothersome to him.  He takes no medications for BPH.  No family history of prostate cancer.  No weight loss or bone pain.   PMH: Past Medical History:  Diagnosis Date  . Diabetes mellitus without complication ([place])   . Hyperlipidemia   . Hypertension     Surgical History: Past Surgical History:  Procedure Laterality Date  . NO PAST SURGERIES      Home Medications:  Allergies as of 07/05/2019   No Known Allergies     Medication List       Accurate as of July 05, 2019  4:59 PM. If you have any questions, ask your nurse or doctor.        STOP taking these medications   famotidine 40 MG tablet Commonly known as: PEPCID Stopped by: Hollice Espy, MD   sucralfate 1 g tablet Commonly known as: Carafate Stopped by: Hollice Espy, MD     TAKE these medications   aspirin EC 81 MG tablet Take 81 mg by mouth daily.   atorvastatin 40 MG tablet Commonly known as: LIPITOR Take 40 mg by mouth daily.   dorzolamide-timolol 22.3-6.8 MG/ML ophthalmic solution Commonly known as: COSOPT 2 (two) times daily.   latanoprost 0.005 % ophthalmic solution Commonly known as: XALATAN Place into both eyes at bedtime.   losartan 25 MG tablet Commonly known as: COZAAR daily.    metFORMIN 1000 MG tablet Commonly known as: GLUCOPHAGE 1,000 mg 2 (two) times daily with a meal.   naproxen 500 MG EC tablet Commonly known as: EC NAPROSYN Take 1 tablet (500 mg total) by mouth 2 (two) times daily with a meal.       Allergies: No Known Allergies  Family History: Family History  Problem Relation Age of Onset  . Diabetes Brother   . Kidney disease Brother     Social History:  reports that he has been smoking cigarettes. He has a 48.00 pack-year smoking history. He has never used smokeless tobacco. He reports current alcohol use. He reports that he does not use drugs.  ROS: UROLOGY Frequent Urination?: No Hard to postpone urination?: No Burning/pain with urination?: No Get up at night to urinate?: No Leakage of urine?: No Urine stream starts and stops?: No Trouble starting stream?: No Do you have to strain to urinate?: No Blood in urine?: No Urinary tract infection?: No Sexually transmitted disease?: No Injury to kidneys or bladder?: No Painful intercourse?: No Weak stream?: No Erection problems?: No Penile pain?: No  Gastrointestinal Nausea?: No Vomiting?: No Indigestion/heartburn?: No Diarrhea?: No Constipation?: No  Constitutional Fever: No Night sweats?: No Weight loss?: No Fatigue?: No  Skin Skin rash/lesions?: No Itching?: No  Eyes Blurred vision?: No Double vision?: No  Ears/Nose/Throat Sore throat?:  No Sinus problems?: No  Hematologic/Lymphatic Swollen glands?: No Easy bruising?: No  Cardiovascular Leg swelling?: No Chest pain?: No  Respiratory Cough?: No Shortness of breath?: No  Endocrine Excessive thirst?: No  Musculoskeletal Back pain?: No Joint pain?: No  Neurological Headaches?: No Dizziness?: No  Psychologic Depression?: No Anxiety?: No  Physical Exam: BP 136/72   Pulse 84   Ht 5\' 6"  (1.676 m)   Wt 167 lb (75.8 kg)   BMI 26.95 kg/m   Constitutional:  Alert and oriented, No acute distress.  HEENT: [place] AT, moist mucus membranes.  Trachea midline, no masses. Cardiovascular: No clubbing, cyanosis, or edema. Respiratory: Normal respiratory effort, no increased work of breathing. GI: Abdomen is soft, nontender, nondistended, no abdominal masses Rectal exam: Normal sphincter tone.  40 cc prostate, nontender, no nodules. Skin: No rashes, bruises or suspicious lesions. Neurologic: Grossly intact, no focal deficits, moving all 4 extremities. Psychiatric: Normal mood and affect.  Laboratory Data: Lab Results  Component Value Date   WBC 9.9 01/25/2017   HGB 17.4 01/25/2017   HCT 51.9 01/25/2017   MCV 92.7 01/25/2017   PLT 197 01/25/2017    Lab Results  Component Value Date   CREATININE 1.26 (H) 01/25/2017     Lab Results  Component Value Date   HGBA1C 8.1 (H) 05/11/2014    Urinalysis Results for orders placed or performed in visit on 07/05/19  Microscopic Examination   URINE  Result Value Ref Range   WBC, UA 0-5 0 - 5 /hpf   RBC 0-2 0 - 2 /hpf   Epithelial Cells (non renal) 0-10 0 - 10 /hpf   Bacteria, UA None seen None seen/Few  Urinalysis, Complete  Result Value Ref Range   Specific Gravity, UA 1.020 1.005 - 1.030   pH, UA 5.0 5.0 - 7.5   Color, UA Yellow Yellow   Appearance Ur Clear Clear   Leukocytes,UA Negative Negative   Protein,UA 2+ (A) Negative/Trace   Glucose, UA Negative Negative   Ketones, UA Negative Negative   RBC, UA 1+ (A) Negative   Bilirubin, UA Negative Negative   Urobilinogen, Ur 0.2 0.2 - 1.0 mg/dL   Nitrite, UA Negative Negative   Microscopic Examination See below:     Pertinent Imaging: N/A  Assessment & Plan:    1. Elevated PSA  We reviewed the implications of an elevated PSA and the uncertainty surrounding it. In general, a man's PSA increases with age and is produced by both normal and cancerous prostate tissue. Differential for elevated PSA is BPH, prostate cancer, infection, recent intercourse/ejaculation, prostate  infarction, recent urethroscopic manipulation (foley placement/cystoscopy) and prostatitis. Management of an elevated PSA can include observation or prostate biopsy and wediscussed this in detail. We discussed that indications for prostate biopsy are defined by age and race specific PSA cutoffs as well as a PSA velocity of 0.75/year.  UA checked today, no evidence of UTI as contributing factor.  Plan to recheck his PSA today.  If it remains elevated, will recommend prostate biopsy.  We discussed prostate biopsy in detail including the procedure itself, the risks of blood in the urine, stool, and ejaculate, serious infection, and discomfort.   We will call him tomorrow with his labs and recommendations. - PSA   Return for will call with PSA and recommendations.  Hollice Espy, MD  [facility] [address], [place], [place] [address] [phone]

## 2019-07-06 ENCOUNTER — Telehealth: Payer: Self-pay | Admitting: *Deleted

## 2019-07-06 LAB — PSA: Prostate Specific Ag, Serum: 8.1 ng/mL — ABNORMAL HIGH (ref 0.0–4.0)

## 2019-07-06 NOTE — Telephone Encounter (Addendum)
Informed patient-reviewed instructions with patient answered all questions scheduled appointment and mailed instructions.   ----- Message from Hollice Espy, MD sent at 07/06/2019  7:57 AM EDT ----- Please let this patient know that his PSA is in fact going up.  Is now 8.1.  As per discussion yesterday, I recommend prostate biopsy.  We reviewed the instructions briefly yesterday but please review again with him.  He will need to hold his aspirin 7 days prior to the procedure.  Hollice Espy, MD

## 2019-07-27 ENCOUNTER — Ambulatory Visit (INDEPENDENT_AMBULATORY_CARE_PROVIDER_SITE_OTHER): Payer: Medicare Other | Admitting: Urology

## 2019-07-27 ENCOUNTER — Other Ambulatory Visit: Payer: Self-pay | Admitting: Urology

## 2019-07-27 ENCOUNTER — Other Ambulatory Visit: Payer: Self-pay

## 2019-07-27 VITALS — BP 198/104 | HR 75 | Ht 66.0 in | Wt 170.0 lb

## 2019-07-27 DIAGNOSIS — R972 Elevated prostate specific antigen [PSA]: Secondary | ICD-10-CM

## 2019-07-27 MED ORDER — LEVOFLOXACIN 500 MG PO TABS
500.0000 mg | ORAL_TABLET | Freq: Once | ORAL | Status: AC
  Administered 2019-07-27: 500 mg via ORAL

## 2019-07-27 MED ORDER — GENTAMICIN SULFATE 40 MG/ML IJ SOLN
80.0000 mg | Freq: Once | INTRAMUSCULAR | Status: AC
  Administered 2019-07-27: 80 mg via INTRAMUSCULAR

## 2019-07-27 NOTE — Progress Notes (Signed)
   07/27/19  CC:  Chief Complaint  Patient presents with  . Prostate Biopsy    HPI: 68 year old male with elevated/rising PSA presents today for prostate biopsy.  Please see previous notes for details.  Vitals:   07/27/19 1125  BP: (!) 198/104  Pulse: 75   NED. A&Ox3.   No respiratory distress   Abd soft, NT, ND Normal sphincter tone  Prostate Biopsy Procedure   Informed consent was obtained after discussing risks/benefits of the procedure.  A time out was performed to ensure correct patient identity.  Pre-Procedure: - Gentamicin given prophylactically - Levaquin 500 mg administered PO -Transrectal Ultrasound performed revealing a 32 gm prostate -No significant hypoechoic or median lobe noted  Procedure: - Prostate block performed using 10 cc 1% lidocaine and biopsies taken from sextant areas, a total of 12 under ultrasound guidance.  Post-Procedure: - Patient tolerated the procedure well - He was counseled to seek immediate medical attention if experiences any severe pain, significant bleeding, or fevers - Return in two week to discuss biopsy results   Hollice Espy, MD

## 2019-08-04 LAB — ANATOMIC PATHOLOGY REPORT: PDF Image: 0

## 2019-08-14 ENCOUNTER — Other Ambulatory Visit: Payer: Self-pay | Admitting: Urology

## 2019-08-16 ENCOUNTER — Telehealth: Payer: Self-pay | Admitting: Urology

## 2019-08-16 ENCOUNTER — Telehealth: Payer: Self-pay

## 2019-08-16 ENCOUNTER — Ambulatory Visit: Payer: Medicare Other | Admitting: Urology

## 2019-08-16 DIAGNOSIS — R972 Elevated prostate specific antigen [PSA]: Secondary | ICD-10-CM

## 2019-08-16 DIAGNOSIS — C61 Malignant neoplasm of prostate: Secondary | ICD-10-CM

## 2019-08-16 NOTE — Telephone Encounter (Signed)
I am working on this and will keep you posted

## 2019-08-16 NOTE — Progress Notes (Deleted)
08/16/2019 10:07 AM   Allen Massey 03/07/1951 [id number]  Referring provider: No referring provider defined for this encounter.  No chief complaint on file.   HPI: 68 year old male who returns today to review prostate biopsy results.  Patient underwent routine annual labs on 06/01/2019 at which time his PSA was noted to be 6.2.   Rectal exam was benign.  Prostate biopsy on 07/27/2019 revealed a 32 g prostate without a significant median lobe.  This revealed 5 of 12 cores of prostate cancer, on the left.  This ranged from Gleason 3+3, 3+4 up to Gleason 4+4 involving up to 50% at the left lateral apex.  No baseline urinary symptoms.    Score:  1-7 Mild 8-19 Moderate 20-35 Severe       PMH: Past Medical History:  Diagnosis Date  . Diabetes mellitus without complication ([place])   . Hyperlipidemia   . Hypertension     Surgical History: Past Surgical History:  Procedure Laterality Date  . NO PAST SURGERIES      Home Medications:  Allergies as of 08/16/2019   No Known Allergies     Medication List       Accurate as of August 16, 2019 10:07 AM. If you have any questions, ask your nurse or doctor.        aspirin EC 81 MG tablet Take 81 mg by mouth daily.   atorvastatin 40 MG tablet Commonly known as: LIPITOR Take 40 mg by mouth daily.   dorzolamide-timolol 22.3-6.8 MG/ML ophthalmic solution Commonly known as: COSOPT 2 (two) times daily.   latanoprost 0.005 % ophthalmic solution Commonly known as: XALATAN Place into both eyes at bedtime.   losartan 25 MG tablet Commonly known as: COZAAR daily.   metFORMIN 1000 MG tablet Commonly known as: GLUCOPHAGE 1,000 mg 2 (two) times daily with a meal.   naproxen 500 MG EC tablet Commonly known as: EC NAPROSYN Take 1 tablet (500 mg total) by mouth 2 (two) times daily with a meal.       Allergies: No Known Allergies  Family History: Family History  Problem Relation Age of Onset  . Diabetes  Brother   . Kidney disease Brother     Social History:  reports that he has been smoking cigarettes. He has a 48.00 pack-year smoking history. He has never used smokeless tobacco. He reports current alcohol use. He reports that he does not use drugs.  ROS:                                        Physical Exam: There were no vitals taken for this visit.  Constitutional:  Alert and oriented, No acute distress. HEENT: [place] AT, moist mucus membranes.  Trachea midline, no masses. Cardiovascular: No clubbing, cyanosis, or edema. Respiratory: Normal respiratory effort, no increased work of breathing. GI: Abdomen is soft, nontender, nondistended, no abdominal masses GU: No CVA tenderness Lymph: No cervical or inguinal lymphadenopathy. Skin: No rashes, bruises or suspicious lesions. Neurologic: Grossly intact, no focal deficits, moving all 4 extremities. Psychiatric: Normal mood and affect.  Laboratory Data: Lab Results  Component Value Date   WBC 9.9 01/25/2017   HGB 17.4 01/25/2017   HCT 51.9 01/25/2017   MCV 92.7 01/25/2017   PLT 197 01/25/2017    Lab Results  Component Value Date   CREATININE 1.26 (H) 01/25/2017    No results found for: PSA  No results found for: TESTOSTERONE  Lab Results  Component Value Date   HGBA1C 8.1 (H) 05/11/2014    Urinalysis    Component Value Date/Time   COLORURINE YELLOW (A) 03/24/2015 1640   APPEARANCEUR Clear 07/05/2019 1155   LABSPEC 1.019 03/24/2015 1640   LABSPEC 1.013 05/10/2014 2347   PHURINE 5.0 03/24/2015 1640   GLUCOSEU Negative 07/05/2019 1155   GLUCOSEU Negative 05/10/2014 2347   HGBUR 1+ (A) 03/24/2015 1640   BILIRUBINUR Negative 07/05/2019 1155   BILIRUBINUR Negative 05/10/2014 2347   KETONESUR NEGATIVE 03/24/2015 1640   PROTEINUR 2+ (A) 07/05/2019 1155   PROTEINUR NEGATIVE 03/24/2015 1640   NITRITE Negative 07/05/2019 1155   NITRITE NEGATIVE 03/24/2015 1640   LEUKOCYTESUR Negative 07/05/2019  1155   LEUKOCYTESUR Negative 05/10/2014 2347    Lab Results  Component Value Date   LABMICR See below: 07/05/2019   WBCUA 0-5 07/05/2019   LABEPIT 0-10 07/05/2019   BACTERIA None seen 07/05/2019    Pertinent Imaging: *** No results found for this or any previous visit. No results found for this or any previous visit. No results found for this or any previous visit. No results found for this or any previous visit. No results found for this or any previous visit. No results found for this or any previous visit. No results found for this or any previous visit. No results found for this or any previous visit.  Assessment & Plan:    There are no diagnoses linked to this encounter.  No follow-ups on file.  Hollice Espy, MD  [facility] [address] [place], [place] [address] [phone]

## 2019-08-16 NOTE — Telephone Encounter (Signed)
Called patient discuss prostate cancer biopsy results.  He was scheduled to see [place] in clinic today but was unable to get a ride.  As such, I called him to relay discuss results.  Briefly shared with the patient that he has high risk prostate cancer will need staging.  We discussed CT scan and bone scan.  Ideally, we could obtain these 2 studies before his reschedule follow-up visit.  He is agreeable this plan.  Hollice Espy, MD

## 2019-08-16 NOTE — Progress Notes (Deleted)
08/22/2019 11:40 AM   [name] January 14, 1951 [id number]  Referring provider: No referring provider defined for this encounter.  No chief complaint on file.   HPI: 68 year old male who returns today to review prostate biopsy results.  Patient underwent routine annual labs on 06/01/2019 at which time his PSA was noted to be 6.2.   Rectal exam was benign.  Prostate biopsy on 07/27/2019 revealed a 32 g prostate without a significant median lobe.  This revealed 5 of 12 cores of prostate cancer, on the left.  This ranged from Gleason 3+3, 3+4 up to Gleason 4+4 involving up to 50% at the left lateral apex.  No baseline urinary symptoms.    Score:  1-7 Mild 8-19 Moderate 20-35 Severe       PMH: Past Medical History:  Diagnosis Date  . Diabetes mellitus without complication ([place])   . Hyperlipidemia   . Hypertension     Surgical History: Past Surgical History:  Procedure Laterality Date  . NO PAST SURGERIES      Home Medications:  Allergies as of 08/22/2019   No Known Allergies     Medication List       Accurate as of August 16, 2019 11:40 AM. If you have any questions, ask your nurse or doctor.        aspirin EC 81 MG tablet Take 81 mg by mouth daily.   atorvastatin 40 MG tablet Commonly known as: LIPITOR Take 40 mg by mouth daily.   dorzolamide-timolol 22.3-6.8 MG/ML ophthalmic solution Commonly known as: COSOPT 2 (two) times daily.   latanoprost 0.005 % ophthalmic solution Commonly known as: XALATAN Place into both eyes at bedtime.   losartan 25 MG tablet Commonly known as: COZAAR daily.   metFORMIN 1000 MG tablet Commonly known as: GLUCOPHAGE 1,000 mg 2 (two) times daily with a meal.   naproxen 500 MG EC tablet Commonly known as: EC NAPROSYN Take 1 tablet (500 mg total) by mouth 2 (two) times daily with a meal.       Allergies: No Known Allergies  Family History: Family History  Problem Relation Age of Onset  . Diabetes  Brother   . Kidney disease Brother     Social History:  reports that he has been smoking cigarettes. He has a 48.00 pack-year smoking history. He has never used smokeless tobacco. He reports current alcohol use. He reports that he does not use drugs.  ROS:                                        Physical Exam: There were no vitals taken for this visit.  Constitutional:  Alert and oriented, No acute distress. HEENT: [place] AT, moist mucus membranes.  Trachea midline, no masses. Cardiovascular: No clubbing, cyanosis, or edema. Respiratory: Normal respiratory effort, no increased work of breathing. GI: Abdomen is soft, nontender, nondistended, no abdominal masses GU: No CVA tenderness Lymph: No cervical or inguinal lymphadenopathy. Skin: No rashes, bruises or suspicious lesions. Neurologic: Grossly intact, no focal deficits, moving all 4 extremities. Psychiatric: Normal mood and affect.  Laboratory Data: Lab Results  Component Value Date   WBC 9.9 01/25/2017   HGB 17.4 01/25/2017   HCT 51.9 01/25/2017   MCV 92.7 01/25/2017   PLT 197 01/25/2017    Lab Results  Component Value Date   CREATININE 1.26 (H) 01/25/2017    No results found for: PSA  No results found for: TESTOSTERONE  Lab Results  Component Value Date   HGBA1C 8.1 (H) 05/11/2014    Urinalysis    Component Value Date/Time   COLORURINE YELLOW (A) 03/24/2015 1640   APPEARANCEUR Clear 07/05/2019 1155   LABSPEC 1.019 03/24/2015 1640   LABSPEC 1.013 05/10/2014 2347   PHURINE 5.0 03/24/2015 1640   GLUCOSEU Negative 07/05/2019 1155   GLUCOSEU Negative 05/10/2014 2347   HGBUR 1+ (A) 03/24/2015 1640   BILIRUBINUR Negative 07/05/2019 1155   BILIRUBINUR Negative 05/10/2014 2347   KETONESUR NEGATIVE 03/24/2015 1640   PROTEINUR 2+ (A) 07/05/2019 1155   PROTEINUR NEGATIVE 03/24/2015 1640   NITRITE Negative 07/05/2019 1155   NITRITE NEGATIVE 03/24/2015 1640   LEUKOCYTESUR Negative 07/05/2019  1155   LEUKOCYTESUR Negative 05/10/2014 2347    Lab Results  Component Value Date   LABMICR See below: 07/05/2019   WBCUA 0-5 07/05/2019   LABEPIT 0-10 07/05/2019   BACTERIA None seen 07/05/2019    Pertinent Imaging: *** No results found for this or any previous visit. No results found for this or any previous visit. No results found for this or any previous visit. No results found for this or any previous visit. No results found for this or any previous visit. No results found for this or any previous visit. No results found for this or any previous visit. No results found for this or any previous visit.  Assessment & Plan:    There are no diagnoses linked to this encounter.  No follow-ups on file.  Hollice Espy, MD  [facility] [address], [place], [place] [address] [phone]

## 2019-08-16 NOTE — Telephone Encounter (Signed)
Patient has been notified that lung cancer screening CT scan is due currently or will be in near future. Confirmed that patient is within the appropriate age range, and asymptomatic, (no signs or symptoms of lung cancer). Patient denies illness that would prevent curative treatment for lung cancer if found. Verified smoking history (Current Smoker- patient states that he is smoking 2 cig. per day ). Patient is agreeable for CT scan being scheduled any day except 08/22/19.

## 2019-08-17 NOTE — Telephone Encounter (Signed)
Given patient's recent workup regarding prostate cancer. We will postpone lung screening scan for now.

## 2019-08-18 ENCOUNTER — Ambulatory Visit: Payer: Medicare Other

## 2019-08-22 ENCOUNTER — Telehealth: Payer: Medicare Other | Admitting: Urology

## 2019-08-25 ENCOUNTER — Other Ambulatory Visit: Payer: Self-pay

## 2019-08-25 ENCOUNTER — Ambulatory Visit
Admission: RE | Admit: 2019-08-25 | Discharge: 2019-08-25 | Disposition: A | Discharge status: Home / Self Care | Payer: Medicare Other | Source: Ambulatory Visit | Attending: Urology | Admitting: Urology

## 2019-08-25 ENCOUNTER — Encounter
Admission: RE | Admit: 2019-08-25 | Discharge: 2019-08-25 | Disposition: A | Discharge status: Home / Self Care | Payer: Medicare Other | Source: Ambulatory Visit | Attending: Urology | Admitting: Urology

## 2019-08-25 DIAGNOSIS — C61 Malignant neoplasm of prostate: Secondary | ICD-10-CM

## 2019-08-25 LAB — POCT I-STAT CREATININE: Creatinine, Ser: 1.1 mg/dL (ref 0.61–1.24)

## 2019-08-25 MED ORDER — IOHEXOL 300 MG/ML  SOLN
100.0000 mL | Freq: Once | INTRAMUSCULAR | Status: AC | PRN
  Administered 2019-08-25: 100 mL via INTRAVENOUS

## 2019-08-25 MED ORDER — TECHNETIUM TC 99M MEDRONATE IV KIT
20.8700 | PACK | Freq: Once | INTRAVENOUS | Status: AC | PRN
  Administered 2019-08-25: 20.87 via INTRAVENOUS

## 2019-08-30 ENCOUNTER — Ambulatory Visit (INDEPENDENT_AMBULATORY_CARE_PROVIDER_SITE_OTHER): Payer: Medicare Other | Admitting: Urology

## 2019-08-30 ENCOUNTER — Telehealth: Payer: Medicare Other | Admitting: Urology

## 2019-08-30 ENCOUNTER — Other Ambulatory Visit: Payer: Self-pay

## 2019-08-30 VITALS — BP 198/107 | HR 91 | Ht 66.0 in | Wt 174.0 lb

## 2019-08-30 DIAGNOSIS — I6523 Occlusion and stenosis of bilateral carotid arteries: Secondary | ICD-10-CM | POA: Diagnosis not present

## 2019-08-30 DIAGNOSIS — K429 Umbilical hernia without obstruction or gangrene: Secondary | ICD-10-CM | POA: Diagnosis not present

## 2019-08-30 DIAGNOSIS — C61 Malignant neoplasm of prostate: Secondary | ICD-10-CM | POA: Diagnosis not present

## 2019-08-30 NOTE — Progress Notes (Signed)
08/30/2019 12:45 PM   Allen Massey 27-Apr-1951 [id number]  Referring provider: [facility] [place],  [place] [phone]  Chief Complaint  Patient presents with   Follow-up    HPI: 68 year old male who returns today to review prostate biopsy results.  He accompanied today by his daughter.    Patient underwent routine annual labs on 06/01/2019 at which time his PSA was noted to be 6.2.   Rectal exam was benign.  Prostate biopsy on 07/27/2019 revealed a 32 g prostate without a significant median lobe.  This revealed 5 of 12 cores of prostate cancer, on the left.  This ranged from Gleason 3+3, 3+4 up to Gleason 4+4 involving up to 50% at the left lateral apex.  No baseline urinary symptoms.  Staging with CT scan abd/ pelvis with contrast and bone scan negative for evidence of metastatic diease.  Incidental liver lesions likely hemangiomas vs. Focal nodular hyperplasia.   Small fat containing umbilical hernia, reducible.  IPSS    Row Name 08/30/19 0900         International Prostate Symptom Score   How often have you had the sensation of not emptying your bladder?  Not at All     How often have you had to urinate less than every two hours?  Not at All     How often have you found you stopped and started again several times when you urinated?  Not at All     How often have you found it difficult to postpone urination?  Not at All     How often have you had a weak urinary stream?  Not at All     How often have you had to strain to start urination?  Not at All     How many times did you typically get up at night to urinate?  2 Times     Total IPSS Score  2        Score:  1-7 Mild 8-19 Moderate 20-35 Severe  SHIM    Row Name 08/30/19 0950         SHIM: Over the last 6 months:   How do you rate your confidence that you could get and keep an erection?  High     When you had erections with sexual stimulation, how often were your  erections hard enough for penetration (entering your partner)?  A Few Times (much less than half the time)     During sexual intercourse, how often were you able to maintain your erection after you had penetrated (entered) your partner?  A Few Times (much less than half the time)     During sexual intercourse, how difficult was it to maintain your erection to completion of intercourse?  Very Difficult     When you attempted sexual intercourse, how often was it satisfactory for you?  Sometimes (about half the time)       SHIM Total Score   SHIM  13          PMH: Past Medical History:  Diagnosis Date   Diabetes mellitus without complication ([place])    Hyperlipidemia    Hypertension     Surgical History: Past Surgical History:  Procedure Laterality Date   NO PAST SURGERIES      Home Medications:  Allergies as of 08/30/2019   No Known Allergies     Medication List       Accurate as of August 30, 2019 12:45  PM. If you have any questions, ask your nurse or doctor.        aspirin EC 81 MG tablet Take 81 mg by mouth daily.   atorvastatin 40 MG tablet Commonly known as: LIPITOR Take 40 mg by mouth daily.   dorzolamide-timolol 22.3-6.8 MG/ML ophthalmic solution Commonly known as: COSOPT 2 (two) times daily.   latanoprost 0.005 % ophthalmic solution Commonly known as: XALATAN Place into both eyes at bedtime.   losartan 25 MG tablet Commonly known as: COZAAR daily.   metFORMIN 1000 MG tablet Commonly known as: GLUCOPHAGE 1,000 mg 2 (two) times daily with a meal.   naproxen 500 MG EC tablet Commonly known as: EC NAPROSYN Take 1 tablet (500 mg total) by mouth 2 (two) times daily with a meal.       Allergies: No Known Allergies  Family History: Family History  Problem Relation Age of Onset   Diabetes Brother    Kidney disease Brother     Social History:  reports that he has been smoking cigarettes. He has a 48.00 pack-year smoking history. He has  never used smokeless tobacco. He reports current alcohol use. He reports that he does not use drugs.  ROS: UROLOGY Frequent Urination?: No Hard to postpone urination?: No Burning/pain with urination?: No Get up at night to urinate?: No Leakage of urine?: No Urine stream starts and stops?: No Trouble starting stream?: No Do you have to strain to urinate?: No Blood in urine?: No Urinary tract infection?: No Sexually transmitted disease?: No Injury to kidneys or bladder?: No Painful intercourse?: No Weak stream?: No Erection problems?: No Penile pain?: No  Gastrointestinal Nausea?: No Vomiting?: No Indigestion/heartburn?: No Diarrhea?: No Constipation?: No  Constitutional Fever: No Night sweats?: No Weight loss?: No Fatigue?: No  Skin Skin rash/lesions?: No Itching?: No  Eyes Blurred vision?: No Double vision?: No  Ears/Nose/Throat Sore throat?: No Sinus problems?: No  Hematologic/Lymphatic Swollen glands?: No Easy bruising?: No  Cardiovascular Leg swelling?: No Chest pain?: No  Respiratory Cough?: No Shortness of breath?: No  Endocrine Excessive thirst?: No  Musculoskeletal Back pain?: No Joint pain?: No  Neurological Headaches?: No Dizziness?: No  Psychologic Depression?: No Anxiety?: No  Physical Exam: BP (!) 198/107    Pulse 91    Ht 5\' 6"  (1.676 m)    Wt 174 lb (78.9 kg)    BMI 28.08 kg/m   Constitutional:  Alert and oriented, No acute distress. HEENT: [place] AT, moist mucus membranes.  Trachea midline, no masses. Cardiovascular: No clubbing, cyanosis, or edema. Respiratory: Normal respiratory effort, no increased work of breathing. GI: Abdomen is soft, nontender, nondistended, no abdominal masses, small fat containing hernia Skin: No rashes, bruises or suspicious lesions. Neurologic: Grossly intact, no focal deficits, moving all 4 extremities. Psychiatric: Normal mood and affect.  Laboratory Data: Lab Results  Component Value Date     WBC 9.9 01/25/2017   HGB 17.4 01/25/2017   HCT 51.9 01/25/2017   MCV 92.7 01/25/2017   PLT 197 01/25/2017    Lab Results  Component Value Date   CREATININE 1.10 08/25/2019    Lab Results  Component Value Date   HGBA1C 8.1 (H) 05/11/2014    Pertinent Imaging: CT scan and bone scan from 08/25/2019 were both personally reviewed.  Agree with radiologic interpretation.  No evidence of metastatic disease.  Assessment & Plan:    1. Prostate cancer [facility])  68 year old male with newly diagnosed Gleason 4+4 prostate cancer (left) without evidence of metastatic disease on  staging in the form of CT abdomen pelvis and bone scan.  The patient was counseled about the natural history of prostate cancer and the standard treatment options that are available for prostate cancer. It was explained to him how his age and life expectancy, clinical stage, Gleason score, and PSA affect his prognosis, the decision to proceed with additional staging studies, as well as how that information influences recommended treatment strategies. We discussed the roles for active surveillance, radiation therapy, surgical therapy, androgen deprivation, as well as ablative therapy options for the treatment of prostate cancer as appropriate to his individual cancer situation. We discussed the risks and benefits of these options with regard to their impact on cancer control and also in terms of potential adverse events, complications, and impact on quality of life particularly related to urinary, bowel, and sexual function. The patient was encouraged to ask questions throughout the discussion today and all questions were answered to his stated satisfaction. In addition, the patient was provided with and/or directed to appropriate resources and literature for further education about prostate cancer treatment options.  We discussed surgical therapy for prostate cancer including the different available surgical approaches.   Specifically, we discussed robotic prostatectomy with pelvic lymph node dissection based on his restratification.  We discussed, in detail, the risks and expectations of surgery with regard to cancer control, urinary control, and erectile dysfunction as well as expected post operative recovery processed. Additional risks of surgery including but not limited to bleeding, infection, hernia formation, nerve damage, fistula formation, bowel/rectal injury, potentially necessitating colostomy, damage to the urinary tract resulting in urinary leakage, urethral stricture, and cardiopulmonary risk such as myocardial infarction, stroke, death, thromboembolism etc. were explained.   He was also offered referral to radiation oncology to discuss radiation with ADT which would be recommended for 2 to 3 years given his high rate stratification.  He declined this and is leaning toward surgery.  We will follow-up with him next week after he has had more time to discuss this with his daughter and read through the literature.  All additional questions were answered today.  If he does decide to proceed, we can plan to place a stitch in his umbilical hernia to close the small asymptomatic fat-containing fascial defect.  He is agreeable this plan.  In addition to the above, we plan for nonnerve sparing on the left and possible full versus partial nerve sparing on the right.  Bilateral pelvic lymph node dissection would also be recommended at the time of robotic prostatectomy.  Recommend preoperative physical therapy to strengthen pelvic floor muscles.   - Ambulatory referral to Physical Therapy  2. Umbilical hernia without obstruction and without gangrene As above   Hollice Espy, MD  [place] [address] [place], [place] [address] [phone]  I spent 40 min with this patient of which greater than 50% was spent in counseling and coordination of care with the patient.

## 2019-09-04 ENCOUNTER — Other Ambulatory Visit: Payer: Self-pay | Admitting: Radiology

## 2019-09-04 DIAGNOSIS — C61 Malignant neoplasm of prostate: Secondary | ICD-10-CM

## 2019-09-04 DIAGNOSIS — K429 Umbilical hernia without obstruction or gangrene: Secondary | ICD-10-CM

## 2019-09-05 ENCOUNTER — Other Ambulatory Visit: Payer: Self-pay | Admitting: Radiology

## 2019-09-07 ENCOUNTER — Other Ambulatory Visit: Payer: Self-pay

## 2019-09-07 ENCOUNTER — Encounter: Payer: Self-pay | Admitting: Physical Therapy

## 2019-09-07 ENCOUNTER — Ambulatory Visit: Payer: Medicare Other | Attending: Urology | Admitting: Physical Therapy

## 2019-09-07 DIAGNOSIS — M62838 Other muscle spasm: Secondary | ICD-10-CM | POA: Diagnosis not present

## 2019-09-07 DIAGNOSIS — R2689 Other abnormalities of gait and mobility: Secondary | ICD-10-CM | POA: Diagnosis present

## 2019-09-07 NOTE — Therapy (Signed)
[place] MAIN [facility] SERVICES [address] [place], [place], [address] Phone: [phone]   Fax:  [phone]  Physical Therapy Evaluation  Patient Details  Name: Allen Massey MRN: [id number] Date of Birth: 09/19/1951 Referring Provider (PT): Joanette Gula Date: 09/07/2019    Past Medical History:  Diagnosis Date  . Diabetes mellitus without complication ([place])   . Hyperlipidemia   . Hypertension     Past Surgical History:  Procedure Laterality Date  . NO PAST SURGERIES      There were no vitals filed for this visit.   Subjective Assessment - 09/07/19 0958    Subjective  Pt is scheduled for prostate surgery 10/30/19. Pt reports LBP that started 1 month ago that came on without injury. 2/10 pain level.  When he turns after standing to pee, it hurst in the back of the L thigh.  Pain with walking but eases.  His calf muscles tighten up on him after walking.  No issues with urinary. Daily bowel movements with straining sometimes. Daily fluid intake: 1 gal of water, no soda, no coffee, no juice, sometimes 1 cup of alcohol ( beer) .  Walk a mile a day. No stretching after walking. Yard work.    Pertinent History  Denied injuries to hips, knees, feet         [facility] PT Assessment - 09/07/19 1001      Assessment   Medical Diagnosis  Prostate Cancer     Referring Provider (PT)  Erlene Quan      Precautions   Precautions  None      Restrictions   Weight Bearing Restrictions  No      Balance Screen   Has the patient fallen in the past 6 months  Yes    How many times?  1    Has the patient had a decrease in activity level because of a fear of falling?   No    Is the patient reluctant to leave their home because of a fear of falling?   No      Observation/Other Assessments   Observations  L shoulder signficantly lowered than R      Coordination   Gross Motor Movements are Fluid and Coordinated  --   excessive overuse of upper  trap,      AROM   Overall AROM Comments  sideflexion, no pain. Rotation R ~45%, L 25% with minimal pain B. Post Tx: B rotation ~45%, no pain       Strength   Overall Strength Comments  L knee/hip flex/ ext 4-/5, R 5/5       Palpation   SI assessment   FADDIR on R restricted ( post Tx improved).  Increased tightness at throacic region, medial scap B , paraspinal L ( post Tx: improved)     Palpation comment  L iliac crest higher than R, supine:R  ASIS higher, L leg longer.  B ASIS to malleoli 91 cm       Ambulation/Gait   Gait velocity  1.03 m/s ( pre Tx), 1.13 m/s ( post Tx)     Gait Comments  decreased L stance phase, R trunk lean ( pre Tx).  increased L stance phase , less R trunk lean ( postTx)                 Objective measurements completed on examination: See above findings.    Pelvic Floor Special Questions - 09/07/19 1049  Diastasis Recti  3 fingers width along linea alba        [facility] Adult PT Treatment/Exercise - 09/07/19 1001      Exercises   Exercises  --   see pt instructions, horacic mobility, body mechanics     Modalities   Modalities  Moist Heat      Moist Heat Therapy   Number Minutes Moist Heat  5 Minutes    Moist Heat Location  --   back and neck      Manual Therapy   Manual therapy comments  STM/MWM at throacic region/ medial scap, paraspinals L                   PT Long Term Goals - 09/07/19 1119      PT LONG TERM GOAL #1   Title  Pt will demo increased B trunk rotation without pain and improved gait mechanics ( more weight bearing on L stance) in order to walk with less pain.    Time  2    Period  Weeks    Status  New    Target Date  09/21/19      PT LONG TERM GOAL #2   Title  Pt will demo proper diaphragm excursion to progress to deep core coordination / strength exercises to minimize daistasis recti to more optimal intraabdominal pressure system    Time  4    Period  Weeks    Status  New    Target Date  10/05/19       PT LONG TERM GOAL #3   Title  Pt will demo decreased abdominal separation from 3 fingers to < 2 fingers width to progress to pelvic floor strengthening exercises    Time  6    Period  Weeks    Status  New    Target Date  10/19/19      PT LONG TERM GOAL #4   Title  Pt will report LBP 2/10 to 0/10 with walking and turning body after urinating across 2 weeks    Time  8    Period  Weeks    Status  New    Target Date  10/19/19             Plan - 09/07/19 1118    Clinical Impression Statement   Pt is a 68 yo male who is scheduled for prostatectomy on 10/30/19. Pt was referred to [facility] PT to train his pelvic floor mm to yield better outcomes with continence post-surgery. Pt also reported acute LBP.   Pt 's clinical presentations included signs of poor intraabdominal pressure system which is associated increased risk for urinary incontinence: _diastasis recti ( 3 fingers width)  _increased paraspinal mm L, thoracic regional hypomobility _pelvic girdle misalignment, shoulder height difference _gait deviations _dyscoordination of deep core mm  Pt was provided education on etiology of Sx with anatomy, physiology explanation with images along with the benefits of customized pelvic PT Tx based on pt's medical conditions and musculoskeletal deficits.   Following Tx, pt demo'd increased spinal rotation B without pain, equal pelvic alignment, increased gait speed, and improved diaphragmatic excursion. Plan to reassess DRA and Tx and  progress pt to deep core exercises next session. Pt benefit from skilled PT.        Personal Factors and Comorbidities  Age;Comorbidity 2    Comorbidities  HTN, Diabetes, acture LBP    Examination-Activity Limitations  Bed Mobility;Locomotion Level    Stability/Clinical Decision Making  Evolving/Moderate complexity    Clinical Decision Making  Moderate    Rehab Potential  Good    PT Frequency  1x / week    PT Duration  8 weeks    PT  Treatment/Interventions  Neuromuscular re-education;Manual techniques;Stair training;Moist Heat;Patient/family education;Therapeutic activities;Therapeutic exercise;Functional mobility training;Energy conservation;Gait training    Consulted and Agree with Plan of Care  Patient       Patient will benefit from skilled therapeutic intervention in order to improve the following deficits and impairments:  Abnormal gait, Postural dysfunction, Impaired tone, Decreased range of motion, Decreased endurance, Decreased safety awareness, Decreased balance, Decreased mobility, Decreased coordination, Improper body mechanics, Pain  Visit Diagnosis: Other muscle spasm  Other abnormalities of gait and mobility     Problem List Patient Active Problem List   Diagnosis Date Noted  . Carotid stenosis 05/15/2019  . 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 Essential hypertension 05/15/2019  . Diabetes ([place]) 05/15/2019    Jerl Mina ,PT, DPT, E-RYT  09/07/2019, 11:34 AM  [place] MAIN [facility] SERVICES [address] [place], [place], [address] Phone: [phone]   Fax:  [phone]  Name: Masaji Valerio MRN: [id number] Date of Birth: 1951-07-28

## 2019-09-07 NOTE — Patient Instructions (Addendum)
Open book ( handout)  15reps   Angel wings - 15 reps    Breathing "smell good soup" for softer breathing Feel ribcage expand on inhale  Inhale 1-2- pause Exhale 2-1-pause.  15 reps   ___   Avoid straining pelvic floor, abdominal muscles , spine  Use log rolling technique instead of getting out of bed with your neck or the sit-up     Log rolling into and out of bed   Log rolling into and out of bed If getting out of bed on R side, Bent knees, scoot hips/ shoulder to L  Raise R arm completely overhead, rolling onto armpit  Then lower bent knees to bed to get into complete side lying position  Then drop legs off bed, and push up onto R elbow/forearm, and use L hand to push onto the bed

## 2019-09-21 ENCOUNTER — Other Ambulatory Visit: Payer: Self-pay

## 2019-09-21 ENCOUNTER — Ambulatory Visit: Payer: Medicare Other | Admitting: Physical Therapy

## 2019-09-21 DIAGNOSIS — R2689 Other abnormalities of gait and mobility: Secondary | ICD-10-CM

## 2019-09-21 DIAGNOSIS — M62838 Other muscle spasm: Secondary | ICD-10-CM | POA: Diagnosis not present

## 2019-09-21 NOTE — Patient Instructions (Addendum)
Without pillow:  6 directions of neck  5 reps  Angel wings  5 reps   Head/ shoulder/elbow press 5 sec holds  10 reps    ____  move feet first when turning body to minimize injuries

## 2019-09-21 NOTE — Therapy (Signed)
[place] MAIN [facility] SERVICES [address] [place], [place], [address] Phone: [phone]   Fax:  [phone]  Physical Therapy Treatment  Patient Details  Name: Allen Massey MRN: [id number] Date of Birth: 1951/02/18 Referring Provider (PT): Erlene Quan   Encounter Date: 09/21/2019  PT End of Session - 09/21/19 1509    Visit Number  2    Number of Visits  8    PT Start Time  1000    PT Stop Time  1105    PT Time Calculation (min)  65 min       Past Medical History:  Diagnosis Date  . Diabetes mellitus without complication ([place])   . Hyperlipidemia   . Hypertension     Past Surgical History:  Procedure Laterality Date  . NO PAST SURGERIES      There were no vitals filed for this visit.  Subjective Assessment - 09/21/19 1008    Subjective  Pt notices pain in his L hip 2/10.  Sometimes radiating down anteriolateral thigh ending at above the knee. Hurts with twisting hip to side         [facility] PT Assessment - 09/21/19 1010      Observation/Other Assessments   Observations  Turning body with stationary feet.      AROM   Overall AROM Comments  B rotation 45%, L hip pain with hip/ trunk rotation L ( post Tx: no pain)       Palpation   SI assessment   L iliac crest/ L knee  higher than R. L FADDIR limited > R.  hip 90-90 hip IR B WFL . ,  R shoulder higher than L     Palpation comment    increased tightness at neck and shoulders/ pects B/  occiput, L QL                     [facility] Adult PT Treatment/Exercise - 09/21/19 1511      Exercises   Exercises  --   see pt instructions, to promote cervical ROM/ spinal mobilit     Modalities   Modalities  Moist Heat      Moist Heat Therapy   Number Minutes Moist Heat  5 Minutes    Moist Heat Location  --   neck/ pects not billed     Manual Therapy   Manual therapy comments  STM/MWM at neck/ shoulders, inferior mob Grade II at clavicle/ 1st rib, pectoralis B , to promote  depression of shoulders and shoulder abd, distraction at occiput, MWM at L QL                    PT Long Term Goals - 09/07/19 1119      PT LONG TERM GOAL #1   Title  Pt will demo increased B trunk rotation without pain and improved gait mechanics ( more weight bearing on L stance) in order to walk with less pain.    Time  2    Period  Weeks    Status  New    Target Date  09/21/19      PT LONG TERM GOAL #2   Title  Pt will demo proper diaphragm excursion to progress to deep core coordination / strength exercises to minimize daistasis recti to more optimal intraabdominal pressure system    Time  4    Period  Weeks    Status  New    Target Date  10/05/19      PT LONG TERM GOAL #3   Title  Pt will demo decreased abdominal separation from 3 fingers to < 2 fingers width to progress to pelvic floor strengthening exercises    Time  6    Period  Weeks    Status  New    Target Date  10/19/19      PT LONG TERM GOAL #4   Title  Pt will report LBP 2/10 to 0/10 with walking and turning body after urinating across 2 weeks    Time  8    Period  Weeks    Status  New    Target Date  10/19/19            Plan - 09/21/19 1509    Clinical Impression Statement  Pt demo'd good carry over with decreased thoracic spine but required more neck/ shoulder manual Tx to decrease mm tensions R > L. After manual Tx which pt tolerated, pt reported no L hip pain with L rotation. Pt was educated to move feet first when turning body to minimize injuries. Pt reported he had repeated trunk rotation at his job for years prior to retirement and this can be a factor in his unequal shoulder/ iliac crest height. Anticipate pt will be ready for deep core coordination and strengthening after pt demo more equal alignment of hips and shoulders with no one-sided tightness. Pt continues to benefit from skilled PT.    Personal Factors and Comorbidities  Age;Comorbidity 2    Comorbidities  HTN, Diabetes, acture  LBP    Examination-Activity Limitations  Bed Mobility;Locomotion Level    Stability/Clinical Decision Making  Evolving/Moderate complexity    Rehab Potential  Good    PT Frequency  1x / week    PT Duration  8 weeks    PT Treatment/Interventions  Neuromuscular re-education;Manual techniques;Stair training;Moist Heat;Patient/family education;Therapeutic activities;Therapeutic exercise;Functional mobility training;Energy conservation;Gait training    Consulted and Agree with Plan of Care  Patient       Patient will benefit from skilled therapeutic intervention in order to improve the following deficits and impairments:  Abnormal gait, Postural dysfunction, Impaired tone, Decreased range of motion, Decreased endurance, Decreased safety awareness, Decreased balance, Decreased mobility, Decreased coordination, Improper body mechanics, Pain  Visit Diagnosis: Other muscle spasm  Other abnormalities of gait and mobility     Problem List Patient Active Problem List   Diagnosis Date Noted  . Carotid stenosis 05/15/2019  . 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 Essential hypertension 05/15/2019  . Diabetes ([place]) 05/15/2019    Jerl Mina 09/21/2019, 5:33 PM  [place] MAIN [facility] SERVICES [address] [place], [place], [address] Phone: [phone]   Fax:  [phone]  Name: Allen Massey MRN: [id number] Date of Birth: Jun 04, 1951

## 2019-10-02 ENCOUNTER — Ambulatory Visit: Payer: Medicare Other | Admitting: Physical Therapy

## 2019-10-02 ENCOUNTER — Other Ambulatory Visit: Payer: Self-pay

## 2019-10-02 DIAGNOSIS — M62838 Other muscle spasm: Secondary | ICD-10-CM

## 2019-10-02 DIAGNOSIS — R2689 Other abnormalities of gait and mobility: Secondary | ICD-10-CM

## 2019-10-02 NOTE — Therapy (Signed)
[place] MAIN [facility] SERVICES [address] [place], [place], [address] Phone: [phone]   Fax:  [phone]  Physical Therapy Treatment  Patient Details  Name: Allen Massey MRN: [id number] Date of Birth: 17-Aug-1951 Referring Provider (PT): Erlene Quan   Encounter Date: 10/02/2019  PT End of Session - 10/02/19 1401    Visit Number  3    Number of Visits  8    PT Start Time  1018    PT Stop Time  1100    PT Time Calculation (min)  42 min    Activity Tolerance  No increased pain;Patient tolerated treatment well    Behavior During Therapy  [facility] for tasks assessed/performed       Past Medical History:  Diagnosis Date  . Diabetes mellitus without complication ([place])   . Hyperlipidemia   . Hypertension     Past Surgical History:  Procedure Laterality Date  . NO PAST SURGERIES      There were no vitals filed for this visit.  Subjective Assessment - 10/02/19 1018    Subjective  Pt reports L radiating thigh pain stops at midthigh and not the knee anymore.         [facility] PT Assessment - 10/02/19 1029      Observation/Other Assessments   Observations  L shoulder signficantly lowered than R      AROM   Overall AROM Comments  D      Palpation   SI assessment   R iliac crest highter in stance ( with shoe lift in L shoe, R iliac crest levelled)       Ambulation/Gait   Gait velocity  1.11 m/s with shoe lift in L shoe     Gait Comments  L pelvic shift, no R trunk lean , decreased stance on R, R foot drag  ( post Tx with shoe lift , less deviations)                 Pelvic Floor Special Questions - 10/02/19 1402    Diastasis Recti  2 fingers width along linea alba     External Perineal Exam  through clothing, demo'd proper lengthening and quick contraction        [facility] Adult PT Treatment/Exercise - 10/02/19 1029      Therapeutic Activites    Therapeutic Activities  --   gait assessment, fitted pt for L shoe lift for  whole sole     Neuro Re-ed    Neuro Re-ed Details   deep core strengthening 1 and 2                   PT Long Term Goals - 09/07/19 1119      PT LONG TERM GOAL #1   Title  Pt will demo increased B trunk rotation without pain and improved gait mechanics ( more weight bearing on L stance) in order to walk with less pain.    Time  2    Period  Weeks    Status  New    Target Date  09/21/19      PT LONG TERM GOAL #2   Title  Pt will demo proper diaphragm excursion to progress to deep core coordination / strength exercises to minimize daistasis recti to more optimal intraabdominal pressure system    Time  4    Period  Weeks    Status  New    Target Date  10/05/19  PT LONG TERM GOAL #3   Title  Pt will demo decreased abdominal separation from 3 fingers to < 2 fingers width to progress to pelvic floor strengthening exercises    Time  6    Period  Weeks    Status  New    Target Date  10/19/19      PT LONG TERM GOAL #4   Title  Pt will report LBP 2/10 to 0/10 with walking and turning body after urinating across 2 weeks    Time  8    Period  Weeks    Status  New    Target Date  10/19/19            Plan - 10/02/19 1402    Clinical Impression Statement  Pt is progressing well with more spinal mobility. Pt's L radiating thigh pain is centralizing from above knee to midthigh since last session.  Today,  addressed gait deviations and higher R iliac crest with shoe lift in L shoe. Pt demo'd less deviations, faster gait, and no complaints of hip pain. Anticipate equal alignment of R iliac crest will help further centralization L hip pain. Progressed to deep core strengthening as pt demo'd more equal pelvic alignment which will yield better outcomes for pelvic floor function.  Anticipate pt's DRA will continue to improve with more equal alignment of pelvic girdle which will render pt more ready for pelvic floor contraction training. Pt continues to benefit from skilled PT to  optimize intraabdominal pressure for less pain and for decreased risk for incontinence post surgery  Interdisciplinary team information: Pt requested to be put in a rest home after surgery. DPT messaged RN coordinator at referring provider ( urologist) regarding his request and RN plans to help coordinate this request.      Personal Factors and Comorbidities  Age;Comorbidity 2    Comorbidities  HTN, Diabetes, acture LBP    Examination-Activity Limitations  Bed Mobility;Locomotion Level    Stability/Clinical Decision Making  Evolving/Moderate complexity    Rehab Potential  Good    PT Frequency  1x / week    PT Duration  8 weeks    PT Treatment/Interventions  Neuromuscular re-education;Manual techniques;Stair training;Moist Heat;Patient/family education;Therapeutic activities;Therapeutic exercise;Functional mobility training;Energy conservation;Gait training    Consulted and Agree with Plan of Care  Patient       Patient will benefit from skilled therapeutic intervention in order to improve the following deficits and impairments:  Abnormal gait, Postural dysfunction, Impaired tone, Decreased range of motion, Decreased endurance, Decreased safety awareness, Decreased balance, Decreased mobility, Decreased coordination, Improper body mechanics, Pain  Visit Diagnosis: Other muscle spasm  Other abnormalities of gait and mobility     Problem List Patient Active Problem List   Diagnosis Date Noted  . Carotid stenosis 05/15/2019  . 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 Essential hypertension 05/15/2019  . Diabetes ([place]) 05/15/2019    Jerl Mina ,PT, DPT, E-RYT  10/02/2019, 2:15 PM  [place] MAIN [facility] SERVICES [address] [place], [place], [address] Phone: [phone]   Fax:  [phone]  Name: Allen Massey MRN: [id number] Date of Birth: 1950/11/23

## 2019-10-02 NOTE — Patient Instructions (Addendum)
Deep core level 1  and 2   ( handout)    Seated stretch with twist  Seated stretch cat/ cow  ( elbows move back, chest lifts)    Wear shoe lift pieces in L shoe  This week

## 2019-10-12 ENCOUNTER — Other Ambulatory Visit: Payer: Self-pay

## 2019-10-12 ENCOUNTER — Ambulatory Visit: Payer: Medicare Other | Attending: Urology | Admitting: Physical Therapy

## 2019-10-12 DIAGNOSIS — M62838 Other muscle spasm: Secondary | ICD-10-CM | POA: Diagnosis present

## 2019-10-12 DIAGNOSIS — R2689 Other abnormalities of gait and mobility: Secondary | ICD-10-CM | POA: Diagnosis present

## 2019-10-12 NOTE — Therapy (Signed)
[place] MAIN [facility] SERVICES [address] [place], [place], [address] Phone: [phone]   Fax:  [phone]  Physical Therapy Treatment  Patient Details  Name: Allen Massey MRN: [id number] Date of Birth: [birth year] Referring Provider (PT): Erlene Quan   Encounter Date: 10/12/2019  PT End of Session - 10/12/19 1530    Visit Number  4    Number of Visits  8    PT Start Time  1300    PT Stop Time  [id number]    PT Time Calculation (min)  53 min    Activity Tolerance  No increased pain;Patient tolerated treatment well    Behavior During Therapy  [facility] for tasks assessed/performed       Past Medical History:  Diagnosis Date  . Diabetes mellitus without complication ([place])   . Hyperlipidemia   . Hypertension     Past Surgical History:  Procedure Laterality Date  . NO PAST SURGERIES      There were no vitals filed for this visit.  Subjective Assessment - 10/12/19 1304    Subjective  L radiating pain down the L thigh is doing good and it only hurts when walking in the store for long distances         [facility] PT Assessment - 10/12/19 1338      Observation/Other Assessments   Observations  shoulders more levelled       Coordination   Gross Motor Movements are Fluid and Coordinated  --   limited diaphragmatic excursion, ( post Tximproved)      AROM   Overall AROM Comments  restored B rotation ~60%       Strength   Overall Strength Comments  Hip flex R 3+/5, L 5/5, knee ext/flex 5/5        Palpation   Spinal mobility  increased posterior shoulder mm tightness, interspinals ( post Tx improved)     SI assessment   iliac crest levelled                 Pelvic Floor Special Questions - 10/12/19 1328    Diastasis Recti  1 fingers width post Tx     External Perineal Exam  through clothing: overuse of adductor with pelvic floor contraction.  Improved with verbal cues         [facility] Adult PT Treatment/Exercise - 10/12/19 1338       Ambulation/Gait   Gait velocity  1.19 m/s     Gait Comments  no trrunk lean, no pelvic shift , decreased R stance ( reported Hx of stroke affecting R side)       Neuro Re-ed    Neuro Re-ed Details   cued for proper pelvic floor contraction -quick in supine without adductor overuse      Exercises   Exercises  --   reviewed stretches     Manual Therapy   Manual therapy comments  interspinal mm STM/MWM, intercostals lateral, fascial pulling over abdominal wall  in quadriep with shoulder flexion/ trunk twist 5 reps each       Prosthetics   Prosthetic Care Comments                      PT Long Term Goals - 10/12/19 1357      PT LONG TERM GOAL #1   Title  Pt will demo increased B trunk rotation without pain and improved gait mechanics ( more weight bearing on L stance) in order to walk  with less pain.    Time  2    Period  Weeks    Status  Achieved      PT LONG TERM GOAL #2   Title  Pt will demo proper diaphragm excursion to progress to deep core coordination / strength exercises to minimize daistasis recti to more optimal intraabdominal pressure system    Time  4    Period  Weeks    Status  Achieved      PT LONG TERM GOAL #3   Title  Pt will demo decreased abdominal separation from 3 fingers to < 2 fingers width to progress to pelvic floor strengthening exercises    Time  6    Period  Weeks    Status  Achieved      PT LONG TERM GOAL #4   Title  Pt will report LBP 2/10 to 0/10 with walking and turning body after urinating across 2 weeks    Time  8    Period  Weeks    Status  On-going            Plan - 10/12/19 1531    Clinical Impression Statement Pt demo'd improved abdominal closure from 3 fingers to 1 finger width along with increased lateral diaphragmatic excursion post Tx. Pt had no complaints with manual Tx. He showed good carry over with less thoracic kyphosis . These improvements render her ready for progression to pelvic floor quick contraction  training which pt required cues to not activate adductors. Pt continues to benefit from skilled PT.    Personal Factors and Comorbidities  Age;Comorbidity 2    Comorbidities  HTN, Diabetes, acture LBP    Examination-Activity Limitations  Bed Mobility;Locomotion Level    Stability/Clinical Decision Making  Evolving/Moderate complexity    Rehab Potential  Good    PT Frequency  1x / week    PT Duration  8 weeks    PT Treatment/Interventions  Neuromuscular re-education;Manual techniques;Stair training;Moist Heat;Patient/family education;Therapeutic activities;Therapeutic exercise;Functional mobility training;Energy conservation;Gait training    Consulted and Agree with Plan of Care  Patient       Patient will benefit from skilled therapeutic intervention in order to improve the following deficits and impairments:  Abnormal gait, Postural dysfunction, Impaired tone, Decreased range of motion, Decreased endurance, Decreased safety awareness, Decreased balance, Decreased mobility, Decreased coordination, Improper body mechanics, Pain  Visit Diagnosis: Other muscle spasm  Other abnormalities of gait and mobility     Problem List Patient Active Problem List   Diagnosis Date Noted  . Carotid stenosis 05/15/2019  . 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 Essential hypertension 05/15/2019  . Diabetes ([place]) 05/15/2019    Jerl Mina ,PT, DPT, E-RYT  10/12/2019, 3:31 PM  [place] MAIN [facility] SERVICES [address] [place], [place], [address] Phone: [phone]   Fax:  [phone]  Name: Nikos Tun MRN: [id number] Date of Birth: 08/02/1951

## 2019-10-12 NOTE — Patient Instructions (Signed)
PELVIC FLOOR / KEGEL EXERCISES   Pelvic floor/ Kegel exercises are used to strengthen the muscles in the base of your pelvis that are responsible for supporting your pelvic organs and preventing urine/feces leakage. Based on your therapist's recommendations, they can be performed while standing, sitting, or lying down.  Make yourself aware of this muscle group by using these cues:  Imagine you are in a crowded room and you feel the need to pass gas. Your response is to pull up and in at the rectum.  Close the rectum. Pull the muscles up inside your body,feeling your scrotum lifting as well . Feel the pelvic floor muscles lift as if you were walking into a cold lake.  Place your hand on top of your pubic bone. Tighten and draw in the muscles around the anal muscles without squeezing the buttock muscles.  Common Errors:  Breath holding: If you are holding your breath, you may be bearing down against your bladder instead of pulling it up. If you belly bulges up while you are squeezing, you are holding your breath. Be sure to breathe gently in and out while exercising. Counting out loud may help you avoid holding your breath.  Accessory muscle use: You should not see or feel other muscle movement when performing pelvic floor exercises. When done properly, no one can tell that you are performing the exercises. Keep the buttocks, belly and inner thighs relaxed.  Overdoing it: Your muscles can fatigue and stop working for you if you over-exercise. You may actually leak more or feel soreness at the lower abdomen or rectum.   SHORT HOLDS: Position: on back   Inhale and then exhale. Then squeeze the muscle.  (Be sure to let belly sink in with exhales and not push outward)  Perform 10 repetitions, 3  Times/day   ( after breakfast, lunch , and dinner)    **ALSO SQUEEZE BEFORE YOUR SNEEZE, COUGH, LAUGH to decrease downward pressure   **ALSO EXHALE BEFORE YOU RISE AGAINST GRAVITY (lifting, sit to  stand, from squat to stand)    ___________________     [place] now until your surgery. DO NOT DO THESE PELVIC SQUEEZES WHEN CATHETER IS IN.  You can still do the DEEP CORE LEVEL 1 and 2 exercises.    AFTER CATHETER IS REMOVED, you can return to doing these pelvic squeezes

## 2019-10-19 ENCOUNTER — Other Ambulatory Visit: Payer: Self-pay

## 2019-10-19 ENCOUNTER — Ambulatory Visit: Payer: Medicare Other | Admitting: Physical Therapy

## 2019-10-19 DIAGNOSIS — C61 Malignant neoplasm of prostate: Secondary | ICD-10-CM

## 2019-10-19 DIAGNOSIS — M62838 Other muscle spasm: Secondary | ICD-10-CM | POA: Diagnosis not present

## 2019-10-19 DIAGNOSIS — R2689 Other abnormalities of gait and mobility: Secondary | ICD-10-CM

## 2019-10-19 NOTE — Therapy (Addendum)
[place] MAIN [facility] SERVICES [address] [place], [place], [address] Phone: [phone]   Fax:  [phone]  Physical Therapy Treatment / Discharge Summary from Sep 07, 2019- Oct 19, 2019  Patient Details  Name: Allen Massey MRN: [id number] Date of Birth: 1951-05-09 Referring Provider (PT): Erlene Quan   Encounter Date: 10/19/2019  PT End of Session - 10/19/19 1401    Visit Number  5    Number of Visits  8    PT Start Time  1300    PT Stop Time  [id number]    PT Time Calculation (min)  56 min    Activity Tolerance  No increased pain;Patient tolerated treatment well    Behavior During Therapy  [facility] for tasks assessed/performed       Past Medical History:  Diagnosis Date  . Diabetes mellitus without complication ([place])   . Hyperlipidemia   . Hypertension     Past Surgical History:  Procedure Laterality Date  . NO PAST SURGERIES      There were no vitals filed for this visit.  Subjective Assessment - 10/19/19 1309    Subjective  Pt has no more L thigh pain, even when he walks in the store. but pt is experiencing calf cramps         [facility] PT Assessment - 10/19/19 1350      Strength   Overall Strength Comments  DF/EV L 3+/5, L 4+/5       Palpation   Palpation comment  tightness at anterior/ posterior compartment of L leg, hypomobile DF/EV on L limited toe abduction                    [facility] Adult PT Treatment/Exercise - 10/19/19 1351      Therapeutic Activites    Therapeutic Activities  --   provided additional L shoe lift     Neuro Re-ed    Neuro Re-ed Details   cued for proper DF/EV strengtehning with band . Reviewed pelvic floor exercises       Manual Therapy   Manual therapy comments  STM/MWM at anterior/posterior compartments of L leg, AP mob at talocrucal joint to increased DF/EV                   PT Long Term Goals - 10/19/19 1354      PT LONG TERM GOAL #1   Title  Pt will demo increased B  trunk rotation without pain and improved gait mechanics ( more weight bearing on L stance) in order to walk with less pain.    Time  2    Period  Weeks    Status  Achieved      PT LONG TERM GOAL #2   Title  Pt will demo proper diaphragm excursion to progress to deep core coordination / strength exercises to minimize daistasis recti to more optimal intraabdominal pressure system    Time  4    Period  Weeks    Status  Achieved      PT LONG TERM GOAL #3   Title  Pt will demo decreased abdominal separation from 3 fingers to < 2 fingers width to progress to pelvic floor strengthening exercises    Time  6    Period  Weeks    Status  Achieved      PT LONG TERM GOAL #4   Title  Pt will report LBP 2/10 to 0/10 with walking and turning body after  urinating across 2 weeks    Time  8    Period  Weeks    Status  Achieved            Plan - 10/19/19 1402    Clinical Impression Statement Pt has achieved all 4 of his goals.   Pt's L LBP / thigh pain has resolved and he is able to walk in stores and shop without pain. This function was restored because the following deficits were addressed:  pt's leg length difference, pelvic alignment, diastasis recti, spinal hypomobility, gait deviations. Pt demo'd increased spinal mobility, more upright posture, no more abdominal separation, increased deep core coordination and strength, improved leg/ ankle mobility, and gait mechanics ( less trunk lean, slight decreased RLE stance remains 2/2 Hx of  CVA). Applied manual Tx today to minimize his c/o L calf tightness. Pt demo'd decreased mm tensions at calf and increased ankle mobility post Tx.   Pt demo'd correct technique for deep core coordination and strengthening to pelvic floor strengthening. Pt was explained to not perform pelvic floor contractions when catheter is inserted but can resume the exercises after it is removed. Pt voiced understanding. Anticipate pt will do well with decreased incontinence  post-op as long as pt remains compliant with his HEP.   Pt is ready for d/c today.          Personal Factors and Comorbidities  Age;Comorbidity 2    Comorbidities  HTN, Diabetes, acture LBP    Examination-Activity Limitations  Bed Mobility;Locomotion Level    Stability/Clinical Decision Making  Evolving/Moderate complexity    Rehab Potential  Good    PT Frequency  1x / week    PT Duration  8 weeks    PT Treatment/Interventions  Neuromuscular re-education;Manual techniques;Stair training;Moist Heat;Patient/family education;Therapeutic activities;Therapeutic exercise;Functional mobility training;Energy conservation;Gait training    Consulted and Agree with Plan of Care  Patient       Patient will benefit from skilled therapeutic intervention in order to improve the following deficits and impairments:  Abnormal gait, Postural dysfunction, Impaired tone, Decreased range of motion, Decreased endurance, Decreased safety awareness, Decreased balance, Decreased mobility, Decreased coordination, Improper body mechanics, Pain  Visit Diagnosis: Other muscle spasm  Other abnormalities of gait and mobility     Problem List Patient Active Problem List   Diagnosis Date Noted  . Carotid stenosis 05/15/2019  . 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 Essential hypertension 05/15/2019  . Diabetes ([place]) 05/15/2019    Jerl Mina ,PT, DPT, E-RYT  10/19/2019, 2:03 PM  [place] MAIN [facility] SERVICES [address] [place], [place], [address] Phone: [phone]   Fax:  [phone]  Name: Cuinn Bechel MRN: [id number] Date of Birth: [birth year]

## 2019-10-19 NOTE — Patient Instructions (Addendum)
To minimize L calf cramp  Wear shoe lift in L shoe   Stretch with towel , foot on stool   Ankle strengthening on L with band band wrapped around outer L side of foot ballmound of L foot pressing onto band , R foot is placed on top of band hip width apart, with the ballmound ,  R hand holds the band 30 reps swinging L pinky toe out to the L  X 2x day  __  PELVIC FLOOR / KEGEL EXERCISES   Pelvic floor/ Kegel exercises are used to strengthen the muscles in the base of your pelvis that are responsible for supporting your pelvic organs and preventing urine/feces leakage. Based on your therapist's recommendations, they can be performed while standing, sitting, or lying down.  Make yourself aware of this muscle group by using these cues:  Imagine you are in a crowded room and you feel the need to pass gas. Your response is to pull up and in at the rectum.  Close the rectum. Pull the muscles up inside your body,feeling your scrotum lifting as well . Feel the pelvic floor muscles lift as if you were walking into a cold lake.  Place your hand on top of your pubic bone. Tighten and draw in the muscles around the anal muscles without squeezing the buttock muscles.  Common Errors:  Breath holding: If you are holding your breath, you may be bearing down against your bladder instead of pulling it up. If you belly bulges up while you are squeezing, you are holding your breath. Be sure to breathe gently in and out while exercising. Counting out loud may help you avoid holding your breath.  Accessory muscle use: You should not see or feel other muscle movement when performing pelvic floor exercises. When done properly, no one can tell that you are performing the exercises. Keep the buttocks, belly and inner thighs relaxed.  Overdoing it: Your muscles can fatigue and stop working for you if you over-exercise. You may actually leak more or feel soreness at the lower abdomen or rectum.   SHORT  HOLDS: Position: on back , seated  Inhale and then exhale. Then squeeze the muscle.  (Be sure to let belly sink in with exhales and not push outward)  Perform 10 repetitions, 5 x  Times/day   ( after breakfast, lunch , and dinner)    **ALSO SQUEEZE BEFORE YOUR SNEEZE, COUGH, LAUGH to decrease downward pressure   **ALSO EXHALE BEFORE YOU RISE AGAINST GRAVITY (lifting, sit to stand, from squat to stand)    ___________________     [place] now until your surgery. DO NOT DO THESE PELVIC SQUEEZES WHEN CATHETER IS IN.  You can still do the DEEP CORE LEVEL 1 and 2 exercises.    AFTER CATHETER IS REMOVED, you can return to doing these pelvic squeezes

## 2019-10-20 ENCOUNTER — Other Ambulatory Visit: Payer: Self-pay

## 2019-10-20 ENCOUNTER — Other Ambulatory Visit: Payer: Medicare Other

## 2019-10-20 ENCOUNTER — Inpatient Hospital Stay: Admission: RE | Admit: 2019-10-20 | Payer: Medicare Other | Source: Ambulatory Visit

## 2019-10-20 DIAGNOSIS — C61 Malignant neoplasm of prostate: Secondary | ICD-10-CM

## 2019-10-20 LAB — URINALYSIS, COMPLETE
Bilirubin, UA: NEGATIVE
Glucose, UA: NEGATIVE
Ketones, UA: NEGATIVE
Leukocytes,UA: NEGATIVE
Nitrite, UA: NEGATIVE
Specific Gravity, UA: >1.03 — ABNORMAL HIGH (ref 1.005–1.030)
Urobilinogen, Ur: 0.2 mg/dL (ref 0.2–1.0)
pH, UA: 5.5 (ref 5.0–7.5)

## 2019-10-20 LAB — MICROSCOPIC EXAMINATION: Bacteria, UA: NONE SEEN

## 2019-10-23 LAB — CULTURE, URINE COMPREHENSIVE

## 2019-10-24 ENCOUNTER — Encounter: Admission: RE | Admit: 2019-10-24 | Payer: Medicare Other | Source: Ambulatory Visit

## 2019-10-24 ENCOUNTER — Encounter: Payer: Self-pay | Admitting: Urology

## 2019-10-24 DIAGNOSIS — Z20822 Contact with and (suspected) exposure to covid-19: Secondary | ICD-10-CM | POA: Diagnosis not present

## 2019-10-24 DIAGNOSIS — Z01818 Encounter for other preprocedural examination: Secondary | ICD-10-CM | POA: Diagnosis present

## 2019-10-24 DIAGNOSIS — Z79899 Other long term (current) drug therapy: Secondary | ICD-10-CM | POA: Diagnosis not present

## 2019-10-24 DIAGNOSIS — Z7984 Long term (current) use of oral hypoglycemic drugs: Secondary | ICD-10-CM | POA: Diagnosis not present

## 2019-10-24 DIAGNOSIS — E785 Hyperlipidemia, unspecified: Secondary | ICD-10-CM | POA: Diagnosis not present

## 2019-10-24 DIAGNOSIS — C61 Malignant neoplasm of prostate: Secondary | ICD-10-CM | POA: Diagnosis not present

## 2019-10-24 DIAGNOSIS — I10 Essential (primary) hypertension: Secondary | ICD-10-CM | POA: Diagnosis not present

## 2019-10-24 DIAGNOSIS — E119 Type 2 diabetes mellitus without complications: Secondary | ICD-10-CM | POA: Diagnosis not present

## 2019-10-24 DIAGNOSIS — Z7982 Long term (current) use of aspirin: Secondary | ICD-10-CM | POA: Diagnosis not present

## 2019-10-24 NOTE — Patient Instructions (Signed)
Your procedure is scheduled on: 10-30-19 MONDAY Report to Same Day Surgery 2nd floor medical mall [facility] Entrance-take elevator on left to 2nd floor.  Check in with surgery information desk.) To find out your arrival time please call [phone] between 1PM - 3PM on 10-27-19 FRIDAY  Remember: Instructions that are not followed completely may result in serious medical risk, up to and including death, or upon the discretion of your surgeon and anesthesiologist your surgery may need to be rescheduled.    _x___ 1. Do not eat food after midnight the night before your procedure. NO GUM OR CANDY AFTER MIDNIGHT. You may drink WATER up to 2 hours before you are scheduled to arrive at the hospital for your procedure.  Do not drink WATER  within 2 hours of your scheduled arrival to the hospital.  Type 1 and type 2 diabetics should only drink water.   ____Ensure clear carbohydrate drink on the way to the hospital for bariatric patients  ____Ensure clear carbohydrate drink 3 hours before surgery.     __x__ 2. No Alcohol for 24 hours before or after surgery.   __x__3. No Smoking or e-cigarettes for 24 prior to surgery.  Do not use any chewable tobacco products for at least 6 hour prior to surgery   ____  4. Bring all medications with you on the day of surgery if instructed.    __x__ 5. Notify your doctor if there is any change in your medical condition     (cold, fever, infections).    x___6. On the morning of surgery brush your teeth with toothpaste and water.  You may rinse your mouth with mouth wash if you wish.  Do not swallow any toothpaste or mouthwash.   Do not wear jewelry, make-up, hairpins, clips or nail polish.  Do not wear lotions, powders, or perfumes. You may wear deodorant.  Do not shave 48 hours prior to surgery. Men may shave face and neck.  Do not bring valuables to the hospital.    [facility] is not responsible for any belongings or valuables.               Contacts,  dentures or bridgework may not be worn into surgery.  Leave your suitcase in the car. After surgery it may be brought to your room.  For patients admitted to the hospital, discharge time is determined by your treatment team.  _  Patients discharged the day of surgery will not be allowed to drive home.  You will need someone to drive you home and stay with you the night of your procedure.    Please read over the following fact sheets that you were given:   [facility] Preparing for Surgery   ____ Take anti-hypertensive listed below, cardiac, seizure, asthma, anti-reflux and psychiatric medicines. These include:  1. NONE  2.  3.  4.  5.  6.  ____Fleets enema or Magnesium Citrate as directed.   _x___ Use CHG Soap or sage wipes as directed on instruction sheet   ____ Use inhalers on the day of surgery and bring to hospital day of surgery  _X___ Stop Metformin 2 days prior to surgery-LAST DOSE ON Friday 10-27-19   ____ Take 1/2 of usual insulin dose the night before surgery and none on the morning surgery.   _x___ Follow recommendations from Cardiologist, Pulmonologist or PCP regarding stopping Aspirin, Coumadin, Plavix ,Eliquis, Effient, or Pradaxa, and Pletal-WAS INSTRUCTED TO STOP ASPIRIN ON 10-22-19  X____Stop Anti-inflammatories such as  Advil, Aleve, Ibuprofen, Motrin, Naproxen, Naprosyn, Goodies powders or aspirin products NOW-OK to take Tylenol    ____ Stop supplements until after surgery.     ____ Bring C-Pap to the hospital.

## 2019-10-25 ENCOUNTER — Encounter: Payer: Medicare Other | Admitting: Physical Therapy

## 2019-10-26 ENCOUNTER — Other Ambulatory Visit: Admission: RE | Admit: 2019-10-26 | Payer: Medicare Other | Source: Ambulatory Visit

## 2019-10-26 ENCOUNTER — Encounter
Admission: RE | Admit: 2019-10-26 | Discharge: 2019-10-26 | Disposition: A | Discharge status: Home / Self Care | Payer: Medicare Other | Source: Ambulatory Visit | Attending: Urology | Admitting: Urology

## 2019-10-26 ENCOUNTER — Other Ambulatory Visit: Payer: Self-pay

## 2019-10-26 DIAGNOSIS — C61 Malignant neoplasm of prostate: Secondary | ICD-10-CM | POA: Insufficient documentation

## 2019-10-26 DIAGNOSIS — Z7982 Long term (current) use of aspirin: Secondary | ICD-10-CM | POA: Insufficient documentation

## 2019-10-26 DIAGNOSIS — E119 Type 2 diabetes mellitus without complications: Secondary | ICD-10-CM | POA: Insufficient documentation

## 2019-10-26 DIAGNOSIS — Z01818 Encounter for other preprocedural examination: Secondary | ICD-10-CM | POA: Insufficient documentation

## 2019-10-26 DIAGNOSIS — Z20822 Contact with and (suspected) exposure to covid-19: Secondary | ICD-10-CM | POA: Insufficient documentation

## 2019-10-26 DIAGNOSIS — I10 Essential (primary) hypertension: Secondary | ICD-10-CM

## 2019-10-26 DIAGNOSIS — Z79899 Other long term (current) drug therapy: Secondary | ICD-10-CM | POA: Insufficient documentation

## 2019-10-26 DIAGNOSIS — E785 Hyperlipidemia, unspecified: Secondary | ICD-10-CM | POA: Insufficient documentation

## 2019-10-26 DIAGNOSIS — Z7984 Long term (current) use of oral hypoglycemic drugs: Secondary | ICD-10-CM | POA: Insufficient documentation

## 2019-10-26 LAB — BASIC METABOLIC PANEL
Anion gap: 10 (ref 5–15)
BUN: 14 mg/dL (ref 8–23)
CO2: 26 mmol/L (ref 22–32)
Calcium: 8.7 mg/dL — ABNORMAL LOW (ref 8.9–10.3)
Chloride: 105 mmol/L (ref 98–111)
Creatinine, Ser: 1.23 mg/dL (ref 0.61–1.24)
GFR calc Af Amer: >60 mL/min (ref >60)
GFR calc non Af Amer: 60 mL/min — ABNORMAL LOW (ref >60)
Glucose, Bld: 168 mg/dL — ABNORMAL HIGH (ref 70–99)
Potassium: 3.9 mmol/L (ref 3.5–5.1)
Sodium: 141 mmol/L (ref 135–145)

## 2019-10-26 LAB — CBC
HCT: 48.3 % (ref 39.0–52.0)
Hemoglobin: 16.2 g/dL (ref 13.0–17.0)
MCH: 31.2 pg (ref 26.0–34.0)
MCHC: 33.5 g/dL (ref 30.0–36.0)
MCV: 93.1 fL (ref 80.0–100.0)
Platelets: 208 10*3/uL (ref 150–400)
RBC: 5.19 MIL/uL (ref 4.22–5.81)
RDW: 12.3 % (ref 11.5–15.5)
WBC: 6.7 10*3/uL (ref 4.0–10.5)
nRBC: 0 % (ref 0.0–0.2)

## 2019-10-26 LAB — TYPE AND SCREEN
ABO/RH(D): O POS
Antibody Screen: NEGATIVE

## 2019-10-26 LAB — SARS CORONAVIRUS 2 (TAT 6-24 HRS): SARS Coronavirus 2: NEGATIVE

## 2019-10-26 LAB — PROTIME-INR
INR: 0.9 (ref 0.8–1.2)
Prothrombin Time: 12.4 seconds (ref 11.4–15.2)

## 2019-10-30 ENCOUNTER — Other Ambulatory Visit: Payer: Self-pay

## 2019-10-30 ENCOUNTER — Observation Stay
Admission: RE | Admit: 2019-10-30 | Discharge: 2019-10-31 | Disposition: A | Discharge status: Home / Self Care | Payer: Medicare Other | Attending: Urology | Admitting: Urology

## 2019-10-30 ENCOUNTER — Encounter: Payer: Self-pay | Admitting: Urology

## 2019-10-30 ENCOUNTER — Ambulatory Visit: Payer: Medicare Other | Admitting: Anesthesiology

## 2019-10-30 ENCOUNTER — Encounter
Admission: RE | Disposition: A | Discharge status: Home / Self Care | Payer: Self-pay | Source: Home / Self Care | Attending: Urology

## 2019-10-30 DIAGNOSIS — I10 Essential (primary) hypertension: Secondary | ICD-10-CM | POA: Diagnosis not present

## 2019-10-30 DIAGNOSIS — Z79899 Other long term (current) drug therapy: Secondary | ICD-10-CM | POA: Diagnosis not present

## 2019-10-30 DIAGNOSIS — Z7982 Long term (current) use of aspirin: Secondary | ICD-10-CM | POA: Diagnosis not present

## 2019-10-30 DIAGNOSIS — Z7984 Long term (current) use of oral hypoglycemic drugs: Secondary | ICD-10-CM | POA: Insufficient documentation

## 2019-10-30 DIAGNOSIS — C61 Malignant neoplasm of prostate: Principal | ICD-10-CM

## 2019-10-30 DIAGNOSIS — E785 Hyperlipidemia, unspecified: Secondary | ICD-10-CM | POA: Diagnosis not present

## 2019-10-30 DIAGNOSIS — E1151 Type 2 diabetes mellitus with diabetic peripheral angiopathy without gangrene: Secondary | ICD-10-CM | POA: Diagnosis not present

## 2019-10-30 DIAGNOSIS — F17210 Nicotine dependence, cigarettes, uncomplicated: Secondary | ICD-10-CM | POA: Insufficient documentation

## 2019-10-30 DIAGNOSIS — I69941 Monoplegia of lower limb following unspecified cerebrovascular disease affecting right dominant side: Secondary | ICD-10-CM | POA: Insufficient documentation

## 2019-10-30 DIAGNOSIS — K429 Umbilical hernia without obstruction or gangrene: Secondary | ICD-10-CM

## 2019-10-30 HISTORY — PX: UMBILICAL HERNIA REPAIR: SHX196

## 2019-10-30 HISTORY — DX: Malignant (primary) neoplasm, unspecified: C80.1

## 2019-10-30 HISTORY — DX: Cerebral infarction, unspecified: I63.9

## 2019-10-30 HISTORY — PX: ROBOT ASSISTED LAPAROSCOPIC RADICAL PROSTATECTOMY: SHX5141

## 2019-10-30 HISTORY — PX: PELVIC LYMPH NODE DISSECTION: SHX6543

## 2019-10-30 HISTORY — PX: OTHER SURGICAL HISTORY: SHX169

## 2019-10-30 LAB — HEMOGLOBIN A1C
Hgb A1c MFr Bld: 7.8 % — ABNORMAL HIGH (ref 4.8–5.6)
Mean Plasma Glucose: 177.16 mg/dL

## 2019-10-30 LAB — GLUCOSE, CAPILLARY
Glucose-Capillary: 166 mg/dL — ABNORMAL HIGH (ref 70–99)
Glucose-Capillary: 226 mg/dL — ABNORMAL HIGH (ref 70–99)
Glucose-Capillary: 161 mg/dL — ABNORMAL HIGH (ref 70–99)
Glucose-Capillary: 148 mg/dL — ABNORMAL HIGH (ref 70–99)

## 2019-10-30 LAB — ABO/RH: ABO/RH(D): O POS

## 2019-10-30 SURGERY — PROSTATECTOMY, RADICAL, ROBOT-ASSISTED, LAPAROSCOPIC
Anesthesia: General

## 2019-10-30 MED ORDER — PROPOFOL 10 MG/ML IV BOLUS
INTRAVENOUS | Status: DC | PRN
  Administered 2019-10-30: 120 mg via INTRAVENOUS

## 2019-10-30 MED ORDER — ROCURONIUM BROMIDE 100 MG/10ML IV SOLN
INTRAVENOUS | Status: DC | PRN
  Administered 2019-10-30: 20 mg via INTRAVENOUS
  Administered 2019-10-30: 50 mg via INTRAVENOUS
  Administered 2019-10-30: 20 mg via INTRAVENOUS

## 2019-10-30 MED ORDER — ACETAMINOPHEN 325 MG PO TABS: 650.0000 mg | ORAL_TABLET | ORAL | Status: DC | PRN

## 2019-10-30 MED ORDER — ONDANSETRON HCL 4 MG/2ML IJ SOLN: 4.0000 mg | INTRAMUSCULAR | Status: DC | PRN

## 2019-10-30 MED ORDER — OXYCODONE HCL 5 MG PO TABS: 5.0000 mg | ORAL_TABLET | Freq: Once | ORAL | Status: DC | PRN

## 2019-10-30 MED ORDER — DIPHENHYDRAMINE HCL 12.5 MG/5ML PO ELIX
12.5000 mg | ORAL_SOLUTION | Freq: Four times a day (QID) | ORAL | Status: DC | PRN
  Filled 2019-10-30: qty 5

## 2019-10-30 MED ORDER — FAMOTIDINE 20 MG PO TABS: 20.0000 mg | ORAL_TABLET | Freq: Once | ORAL | Status: AC

## 2019-10-30 MED ORDER — HYDROMORPHONE HCL 1 MG/ML IJ SOLN
INTRAMUSCULAR | Status: AC
  Filled 2019-10-30: qty 1

## 2019-10-30 MED ORDER — ROCURONIUM BROMIDE 50 MG/5ML IV SOLN
INTRAVENOUS | Status: AC
  Filled 2019-10-30: qty 1

## 2019-10-30 MED ORDER — THROMBIN 5000 UNITS EX SOLR
CUTANEOUS | Status: DC | PRN
  Administered 2019-10-30: 5000 [IU] via TOPICAL

## 2019-10-30 MED ORDER — DIPHENHYDRAMINE HCL 50 MG/ML IJ SOLN: 12.5000 mg | Freq: Four times a day (QID) | INTRAMUSCULAR | Status: DC | PRN

## 2019-10-30 MED ORDER — EPHEDRINE SULFATE 50 MG/ML IJ SOLN
INTRAMUSCULAR | Status: AC
  Filled 2019-10-30: qty 1

## 2019-10-30 MED ORDER — ZOLPIDEM TARTRATE 5 MG PO TABS
5.0000 mg | ORAL_TABLET | Freq: Every evening | ORAL | Status: DC | PRN
  Filled 2019-10-30: qty 1

## 2019-10-30 MED ORDER — CEFAZOLIN SODIUM-DEXTROSE 2-4 GM/100ML-% IV SOLN
2.0000 g | INTRAVENOUS | Status: AC
  Administered 2019-10-30: 2 g via INTRAVENOUS

## 2019-10-30 MED ORDER — OXYBUTYNIN CHLORIDE 5 MG PO TABS
ORAL_TABLET | ORAL | Status: AC
  Filled 2019-10-30: qty 1

## 2019-10-30 MED ORDER — PHENYLEPHRINE HCL (PRESSORS) 10 MG/ML IV SOLN
INTRAVENOUS | Status: DC | PRN
  Administered 2019-10-30: 80 ug via INTRAVENOUS
  Administered 2019-10-30 (×2): 100 ug via INTRAVENOUS

## 2019-10-30 MED ORDER — CEFAZOLIN SODIUM-DEXTROSE 1-4 GM/50ML-% IV SOLN
1.0000 g | Freq: Three times a day (TID) | INTRAVENOUS | Status: AC
  Administered 2019-10-30: 1 g via INTRAVENOUS
  Filled 2019-10-30: qty 50

## 2019-10-30 MED ORDER — FENTANYL CITRATE (PF) 100 MCG/2ML IJ SOLN
25.0000 ug | INTRAMUSCULAR | Status: DC | PRN
  Administered 2019-10-30 (×2): 50 ug via INTRAVENOUS

## 2019-10-30 MED ORDER — LATANOPROST 0.005 % OP SOLN
1.0000 [drp] | Freq: Every day | OPHTHALMIC | Status: DC
  Administered 2019-10-30: 1 [drp] via OPHTHALMIC
  Filled 2019-10-30: qty 2.5

## 2019-10-30 MED ORDER — LIDOCAINE HCL (PF) 2 % IJ SOLN
INTRAMUSCULAR | Status: AC
  Filled 2019-10-30: qty 10

## 2019-10-30 MED ORDER — SUCCINYLCHOLINE CHLORIDE 20 MG/ML IJ SOLN
INTRAMUSCULAR | Status: AC
  Filled 2019-10-30: qty 1

## 2019-10-30 MED ORDER — INSULIN ASPART 100 UNIT/ML ~~LOC~~ SOLN
0.0000 [IU] | Freq: Three times a day (TID) | SUBCUTANEOUS | Status: DC
  Administered 2019-10-31 (×2): 2 [IU] via SUBCUTANEOUS
  Filled 2019-10-30 (×3): qty 1

## 2019-10-30 MED ORDER — SODIUM CHLORIDE 0.9 % IV BOLUS
1000.0000 mL | Freq: Once | INTRAVENOUS | Status: AC
  Administered 2019-10-30: 1000 mL via INTRAVENOUS

## 2019-10-30 MED ORDER — OXYBUTYNIN CHLORIDE 5 MG PO TABS
5.0000 mg | ORAL_TABLET | Freq: Three times a day (TID) | ORAL | Status: DC | PRN
  Administered 2019-10-30: 5 mg via ORAL
  Filled 2019-10-30: qty 1

## 2019-10-30 MED ORDER — CEFAZOLIN SODIUM-DEXTROSE 2-4 GM/100ML-% IV SOLN
INTRAVENOUS | Status: AC
  Filled 2019-10-30: qty 100

## 2019-10-30 MED ORDER — FAMOTIDINE 20 MG PO TABS
ORAL_TABLET | ORAL | Status: AC
  Administered 2019-10-30: 20 mg via ORAL
  Filled 2019-10-30: qty 1

## 2019-10-30 MED ORDER — SODIUM CHLORIDE 0.9 % IV SOLN: INTRAVENOUS | Status: DC

## 2019-10-30 MED ORDER — FENTANYL CITRATE (PF) 100 MCG/2ML IJ SOLN
INTRAMUSCULAR | Status: AC
  Filled 2019-10-30: qty 2

## 2019-10-30 MED ORDER — SEVOFLURANE IN SOLN
RESPIRATORY_TRACT | Status: AC
  Filled 2019-10-30: qty 250

## 2019-10-30 MED ORDER — INSULIN ASPART 100 UNIT/ML ~~LOC~~ SOLN
4.0000 [IU] | Freq: Three times a day (TID) | SUBCUTANEOUS | Status: DC
  Administered 2019-10-31 (×2): 4 [IU] via SUBCUTANEOUS
  Filled 2019-10-30 (×3): qty 1

## 2019-10-30 MED ORDER — BRIMONIDINE TARTRATE 0.2 % OP SOLN
1.0000 [drp] | Freq: Three times a day (TID) | OPHTHALMIC | Status: DC
  Administered 2019-10-30 – 2019-10-31 (×3): 1 [drp] via OPHTHALMIC
  Filled 2019-10-30: qty 5

## 2019-10-30 MED ORDER — LOSARTAN POTASSIUM 25 MG PO TABS
25.0000 mg | ORAL_TABLET | ORAL | Status: DC
  Administered 2019-10-31: 25 mg via ORAL
  Filled 2019-10-30: qty 1

## 2019-10-30 MED ORDER — INSULIN ASPART 100 UNIT/ML ~~LOC~~ SOLN
0.0000 [IU] | Freq: Every day | SUBCUTANEOUS | Status: DC
  Administered 2019-10-30: 2 [IU] via SUBCUTANEOUS
  Filled 2019-10-30: qty 1

## 2019-10-30 MED ORDER — HYDROMORPHONE HCL 1 MG/ML IJ SOLN
0.2500 mg | INTRAMUSCULAR | Status: DC | PRN
  Administered 2019-10-30 (×4): 0.5 mg via INTRAVENOUS

## 2019-10-30 MED ORDER — THROMBIN 5000 UNITS EX SOLR
CUTANEOUS | Status: AC
  Filled 2019-10-30: qty 5000

## 2019-10-30 MED ORDER — OXYCODONE HCL 5 MG/5ML PO SOLN: 5.0000 mg | Freq: Once | ORAL | Status: DC | PRN

## 2019-10-30 MED ORDER — ATORVASTATIN CALCIUM 20 MG PO TABS
40.0000 mg | ORAL_TABLET | Freq: Every day | ORAL | Status: DC
  Administered 2019-10-30: 40 mg via ORAL
  Filled 2019-10-30: qty 2

## 2019-10-30 MED ORDER — HEPARIN SODIUM (PORCINE) 5000 UNIT/ML IJ SOLN
5000.0000 [IU] | Freq: Three times a day (TID) | INTRAMUSCULAR | Status: DC
  Administered 2019-10-30 – 2019-10-31 (×4): 5000 [IU] via SUBCUTANEOUS
  Filled 2019-10-30 (×4): qty 1

## 2019-10-30 MED ORDER — PROMETHAZINE HCL 25 MG/ML IJ SOLN: 6.2500 mg | INTRAMUSCULAR | Status: DC | PRN

## 2019-10-30 MED ORDER — ONDANSETRON HCL 4 MG/2ML IJ SOLN
INTRAMUSCULAR | Status: DC | PRN
  Administered 2019-10-30: 4 mg via INTRAVENOUS

## 2019-10-30 MED ORDER — MEPERIDINE HCL 50 MG/ML IJ SOLN: 6.2500 mg | INTRAMUSCULAR | Status: DC | PRN

## 2019-10-30 MED ORDER — FENTANYL CITRATE (PF) 100 MCG/2ML IJ SOLN
INTRAMUSCULAR | Status: DC | PRN
  Administered 2019-10-30: 50 ug via INTRAVENOUS
  Administered 2019-10-30 (×2): 25 ug via INTRAVENOUS
  Administered 2019-10-30: 50 ug via INTRAVENOUS

## 2019-10-30 MED ORDER — PROPOFOL 10 MG/ML IV BOLUS
INTRAVENOUS | Status: AC
  Filled 2019-10-30: qty 20

## 2019-10-30 MED ORDER — LIDOCAINE HCL (CARDIAC) PF 100 MG/5ML IV SOSY
PREFILLED_SYRINGE | INTRAVENOUS | Status: DC | PRN
  Administered 2019-10-30: 60 mg via INTRAVENOUS

## 2019-10-30 MED ORDER — CHLORHEXIDINE GLUCONATE CLOTH 2 % EX PADS
6.0000 | MEDICATED_PAD | Freq: Every day | CUTANEOUS | Status: DC
  Administered 2019-10-31: 6 via TOPICAL

## 2019-10-30 MED ORDER — SUGAMMADEX SODIUM 500 MG/5ML IV SOLN
INTRAVENOUS | Status: DC | PRN
  Administered 2019-10-30: 300 mg via INTRAVENOUS

## 2019-10-30 MED ORDER — OXYCODONE-ACETAMINOPHEN 5-325 MG PO TABS
1.0000 | ORAL_TABLET | ORAL | Status: DC | PRN
  Administered 2019-10-30: 1 via ORAL
  Administered 2019-10-31 (×2): 2 via ORAL
  Filled 2019-10-30: qty 2
  Filled 2019-10-30: qty 1
  Filled 2019-10-30: qty 2

## 2019-10-30 MED ORDER — DORZOLAMIDE HCL-TIMOLOL MAL 2-0.5 % OP SOLN
1.0000 [drp] | Freq: Two times a day (BID) | OPHTHALMIC | Status: DC
  Administered 2019-10-30 – 2019-10-31 (×3): 1 [drp] via OPHTHALMIC
  Filled 2019-10-30: qty 10

## 2019-10-30 MED ORDER — BUPIVACAINE HCL 0.5 % IJ SOLN
INTRAMUSCULAR | Status: DC | PRN
  Administered 2019-10-30: 20 mL

## 2019-10-30 MED ORDER — DOCUSATE SODIUM 100 MG PO CAPS
100.0000 mg | ORAL_CAPSULE | Freq: Two times a day (BID) | ORAL | Status: DC
  Administered 2019-10-30 – 2019-10-31 (×3): 100 mg via ORAL
  Filled 2019-10-30 (×3): qty 1

## 2019-10-30 MED ORDER — BUPIVACAINE HCL (PF) 0.5 % IJ SOLN
INTRAMUSCULAR | Status: AC
  Filled 2019-10-30: qty 30

## 2019-10-30 SURGICAL SUPPLY — 102 items
ANCHOR TIS RET SYS 235ML (MISCELLANEOUS) ×3 IMPLANT
APPLICATOR SURGIFLO ENDO (HEMOSTASIS) ×3 IMPLANT
APPLIER CLIP LOGIC TI 5 (MISCELLANEOUS) IMPLANT
BAG URINE DRAIN 2000ML AR STRL (UROLOGICAL SUPPLIES) ×3 IMPLANT
BLADE CLIPPER SURG (BLADE) ×3 IMPLANT
BULB RESERV EVAC DRAIN JP 100C (MISCELLANEOUS) IMPLANT
CANISTER SUCT 1200ML W/VALVE (MISCELLANEOUS) ×3 IMPLANT
CATH DRAINAGE MALECOT 26FR (CATHETERS) ×2 IMPLANT
CATH FOL 2WAY LX 18X5 (CATHETERS) ×6 IMPLANT
CATH MALECOT (CATHETERS) ×3
CHLORAPREP W/TINT 26 (MISCELLANEOUS) ×6 IMPLANT
CLIP SUT LAPRA TY ABSORB (SUTURE) IMPLANT
CLIP VESOLOCK LG 6/CT PURPLE (CLIP) ×15 IMPLANT
CORD BIP STRL DISP 12FT (MISCELLANEOUS) IMPLANT
CORD MONOPOLAR M/FML 12FT (MISCELLANEOUS) IMPLANT
COVER TIP SHEARS 8 DVNC (MISCELLANEOUS) ×2 IMPLANT
COVER TIP SHEARS 8MM DA VINCI (MISCELLANEOUS) ×1
COVER WAND RF STERILE (DRAPES) ×3 IMPLANT
DEFOGGER SCOPE WARMER CLEARIFY (MISCELLANEOUS) ×3 IMPLANT
DERMABOND ADVANCED (GAUZE/BANDAGES/DRESSINGS) ×1
DERMABOND ADVANCED .7 DNX12 (GAUZE/BANDAGES/DRESSINGS) ×2 IMPLANT
DRAIN CHANNEL JP 15F RND 16 (MISCELLANEOUS) IMPLANT
DRAIN CHANNEL JP 19F (MISCELLANEOUS) IMPLANT
DRAPE 3/4 80X56 (DRAPES) ×3 IMPLANT
DRAPE COLUMN DVNC XI (DISPOSABLE) ×2 IMPLANT
DRAPE DA VINCI XI COLUMN (DISPOSABLE) ×1
DRAPE LEGGINS SURG 28X43 STRL (DRAPES) ×3 IMPLANT
DRAPE SURG 17X11 SM STRL (DRAPES) ×12 IMPLANT
DRAPE UNDER BUTTOCK W/FLU (DRAPES) ×3 IMPLANT
DRIVER LRG NEEDLE DA VINCI (INSTRUMENTS)
DRIVER NDLE LRG DVNC (INSTRUMENTS) IMPLANT
DRSG TELFA 3X8 NADH (GAUZE/BANDAGES/DRESSINGS) ×6 IMPLANT
ELECT REM PT RETURN 9FT ADLT (ELECTROSURGICAL) ×3
ELECTRODE REM PT RTRN 9FT ADLT (ELECTROSURGICAL) ×2 IMPLANT
FORCEPS MARYLAND BIPOLAR 8X55 (INSTRUMENTS)
FORCEPS MRYLND BPLR 8X55 DVNC (INSTRUMENTS) IMPLANT
GLOVE BIO SURGEON STRL SZ 6.5 (GLOVE) ×9 IMPLANT
GLOVE BIOGEL PI IND STRL 7.5 (GLOVE) ×2 IMPLANT
GLOVE BIOGEL PI INDICATOR 7.5 (GLOVE) ×1
GOWN STRL REUS W/ TWL LRG LVL3 (GOWN DISPOSABLE) ×6 IMPLANT
GOWN STRL REUS W/ TWL XL LVL3 (GOWN DISPOSABLE) ×2 IMPLANT
GOWN STRL REUS W/TWL LRG LVL3 (GOWN DISPOSABLE) ×3
GOWN STRL REUS W/TWL XL LVL3 (GOWN DISPOSABLE) ×1
GRASPER SUT TROCAR 14GX15 (MISCELLANEOUS) ×3 IMPLANT
HEMOSTAT SURGICEL 2X14 (HEMOSTASIS) ×9 IMPLANT
HOLDER FOLEY CATH W/STRAP (MISCELLANEOUS) ×3 IMPLANT
IRRIGATION STRYKERFLOW (MISCELLANEOUS) ×2 IMPLANT
IRRIGATOR STRYKERFLOW (MISCELLANEOUS) ×3
IV LACTATED RINGERS 1000ML (IV SOLUTION) ×3 IMPLANT
KIT PINK PAD W/HEAD ARE REST (MISCELLANEOUS) ×3
KIT PINK PAD W/HEAD ARM REST (MISCELLANEOUS) ×2 IMPLANT
LABEL OR SOLS (LABEL) ×3 IMPLANT
MARKER SKIN DUAL TIP RULER LAB (MISCELLANEOUS) ×3 IMPLANT
NEEDLE HYPO 22GX1.5 SAFETY (NEEDLE) ×3 IMPLANT
NEEDLE INSUFFLATION 14GA 120MM (NEEDLE) ×3 IMPLANT
NS IRRIG 500ML POUR BTL (IV SOLUTION) ×3 IMPLANT
OBTURATOR OPTICAL STANDARD 8MM (TROCAR) ×1
OBTURATOR OPTICAL STND 8 DVNC (TROCAR) ×2
OBTURATOR OPTICALSTD 8 DVNC (TROCAR) ×2 IMPLANT
PACK LAP CHOLECYSTECTOMY (MISCELLANEOUS) ×3 IMPLANT
PENCIL ELECTRO HAND CTR (MISCELLANEOUS) ×3 IMPLANT
PROGRASP ENDOWRIST DA VINCI (INSTRUMENTS)
PROGRASP ENDOWRIST DVNC (INSTRUMENTS) IMPLANT
RELOAD STAPLER WHITE 60MM (STAPLE) ×2 IMPLANT
SEAL CANN UNIV 5-8 DVNC XI (MISCELLANEOUS) ×8 IMPLANT
SEAL XI 5MM-8MM UNIVERSAL (MISCELLANEOUS) ×4
SET TUBE SMOKE EVAC HIGH FLOW (TUBING) ×3 IMPLANT
SLEEVE ENDOPATH XCEL 5M (ENDOMECHANICALS) ×6 IMPLANT
SOLUTION ELECTROLUBE (MISCELLANEOUS) ×3 IMPLANT
SPOGE SURGIFLO 8M (HEMOSTASIS) ×1
SPONGE LAP 4X18 RFD (DISPOSABLE) ×3 IMPLANT
SPONGE SURGIFLO 8M (HEMOSTASIS) ×2 IMPLANT
SPONGE VERSALON 4X4 4PLY (MISCELLANEOUS) IMPLANT
STAPLE ECHEON FLEX 60 POW ENDO (STAPLE) ×3 IMPLANT
STAPLER RELOAD WHITE 60MM (STAPLE) ×3
STAPLER SKIN PROX 35W (STAPLE) ×3 IMPLANT
STRAP SAFETY 5IN WIDE (MISCELLANEOUS) ×3 IMPLANT
SURGILUBE 2OZ TUBE FLIPTOP (MISCELLANEOUS) ×3 IMPLANT
SUT DVC VLOC 90 3-0 CV23 UNDY (SUTURE) ×3 IMPLANT
SUT DVC VLOC 90 3-0 CV23 VLT (SUTURE) ×3
SUT ETHIBOND CT1 BRD #0 30IN (SUTURE) ×3 IMPLANT
SUT ETHILON 3-0 FS-10 30 BLK (SUTURE)
SUT MNCRL 4-0 (SUTURE) ×2
SUT MNCRL 4-0 27XMFL (SUTURE) ×4
SUT PROLENE 5 0 RB 1 DA (SUTURE) IMPLANT
SUT SILK 2 0 SH (SUTURE) ×3 IMPLANT
SUT VIC AB 0 CT1 36 (SUTURE) ×6 IMPLANT
SUT VIC AB 2-0 CT1 (SUTURE) ×6 IMPLANT
SUT VIC AB 2-0 SH 27 (SUTURE) ×2
SUT VIC AB 2-0 SH 27XBRD (SUTURE) ×4 IMPLANT
SUT VICRYL 0 AB UR-6 (SUTURE) ×3 IMPLANT
SUTURE DVC VLC 90 3-0 CV23 VLT (SUTURE) ×2 IMPLANT
SUTURE EHLN 3-0 FS-10 30 BLK (SUTURE) IMPLANT
SUTURE MNCRL 4-0 27XMF (SUTURE) ×4 IMPLANT
SYR 10ML LL (SYRINGE) ×3 IMPLANT
SYR BULB IRRIG 60ML STRL (SYRINGE) ×3 IMPLANT
SYRINGE IRR TOOMEY STRL 70CC (SYRINGE) ×6 IMPLANT
TAPE CLOTH 3X10 WHT NS LF (GAUZE/BANDAGES/DRESSINGS) ×6 IMPLANT
TROCAR ENDOPATH XCEL 12X100 BL (ENDOMECHANICALS) ×6 IMPLANT
TROCAR XCEL 12X100 BLDLESS (ENDOMECHANICALS) ×3 IMPLANT
TROCAR XCEL NON-BLD 5MMX100MML (ENDOMECHANICALS) ×3 IMPLANT
WATER STERILE IRR 250ML POUR (IV SOLUTION) ×3 IMPLANT

## 2019-10-30 NOTE — Transfer of Care (Signed)
Immediate Anesthesia Transfer of Care Note  Patient: Allen Massey  Procedure(s) Performed: XI ROBOTIC ASSISTED LAPAROSCOPIC RADICAL PROSTATECTOMY (N/A ) PELVIC LYMPH NODE DISSECTION (Bilateral ) HERNIA REPAIR UMBILICAL ADULT (N/A )  Patient Location: PACU  Anesthesia Type:General  Level of Consciousness: awake, alert  and oriented  Airway & Oxygen Therapy: Patient Spontanous Breathing and Patient connected to face mask oxygen  Post-op Assessment: Report given to RN and Post -op Vital signs reviewed and stable  Post vital signs: Reviewed and stable  Last Vitals:  Vitals Value Taken Time  BP 212/114 10/30/19 1047  Temp 36.6 C 10/30/19 1047  Pulse 89 10/30/19 1050  Resp 15 10/30/19 1050  SpO2 100 % 10/30/19 1050  Vitals shown include unvalidated device data.  Last Pain:  Vitals:   10/30/19 1047  TempSrc:   PainSc: 0-No pain         Complications: No apparent anesthesia complications

## 2019-10-30 NOTE — OR Nursing (Signed)
Patient is feeling better no more moaning his blood pressure is coming down.  [place] aware of blood pressure and does not want to treat as this is patient's baseline.

## 2019-10-30 NOTE — H&P (Signed)
H&P updated 10/30/2019 No changes as below Regular rate and rhythm Clear to auscultation bilaterally Status post PT evaluation Preop labs within normal limits  Allen Massey  July 29, 1951  [id number]  Referring provider: [facility], [facility]  [place]  [place], [place] [address]        Chief Complaint  Patient presents with  . Follow-up   HPI:  69 year old male who presents today for robotic prostatectomy with bilateral pelvic lymph node dissection.   Patient underwent routine annual labs on 06/01/2019 at which time his PSA was noted to be 6.2. Rectal exam was benign.  Prostate biopsy on 07/27/2019 revealed a 32 g prostate without a significant median lobe. This revealed 5 of 12 cores of prostate cancer, on the left. This ranged from Gleason 3+3, 3+4 up to Gleason 4+4 involving up to 50% at the left lateral apex.  No baseline urinary symptoms.  Staging with CT scan abd/ pelvis with contrast and bone scan negative for evidence of metastatic diease. Incidental liver lesions likely hemangiomas vs. Focal nodular hyperplasia.  Small fat containing umbilical hernia, reducible.        IPSS            Row Name 08/30/19 0900             International Prostate Symptom Score    How often have you had the sensation of not emptying your bladder? Not at All      How often have you had to urinate less than every two hours? Not at All      How often have you found you stopped and started again several times when you urinated? Not at All      How often have you found it difficult to postpone urination? Not at All      How often have you had a weak urinary stream? Not at All      How often have you had to strain to start urination? Not at All      How many times did you typically get up at night to urinate? 2 Times      Total IPSS Score 2         Score:  1-7 Mild  8-19 Moderate  20-35 Severe         SHIM            Row Name 08/30/19 0950             SHIM: Over the  last 6 months:    How do you rate your confidence that you could get and keep an erection? High      When you had erections with sexual stimulation, how often were your erections hard enough for penetration (entering your partner)? A Few Times (much less than half the time)      During sexual intercourse, how often were you able to maintain your erection after you had penetrated (entered) your partner? A Few Times (much less than half the time)      During sexual intercourse, how difficult was it to maintain your erection to completion of intercourse? Very Difficult      When you attempted sexual intercourse, how often was it satisfactory for you? Sometimes (about half the time)             SHIM Total Score    SHIM 13         PMH:      Past Medical History:  Diagnosis Date  .  Diabetes mellitus without complication ([place])   . Hyperlipidemia   . Hypertension    Surgical History:       Past Surgical History:  Procedure Laterality Date  . NO PAST SURGERIES     Home Medications:  Allergies as of 08/30/2019   No Known Allergies          Medication List       Accurate as of August 30, 2019 12:45 PM. If you have any questions, ask your nurse or doctor.        aspirin EC 81 MG tablet  Take 81 mg by mouth daily.   atorvastatin 40 MG tablet  Commonly known as: LIPITOR  Take 40 mg by mouth daily.   dorzolamide-timolol 22.3-6.8 MG/ML ophthalmic solution  Commonly known as: COSOPT  2 (two) times daily.   latanoprost 0.005 % ophthalmic solution  Commonly known as: XALATAN  Place into both eyes at bedtime.   losartan 25 MG tablet  Commonly known as: COZAAR  daily.   metFORMIN 1000 MG tablet  Commonly known as: GLUCOPHAGE  1,000 mg 2 (two) times daily with a meal.   naproxen 500 MG EC tablet  Commonly known as: EC NAPROSYN  Take 1 tablet (500 mg total) by mouth 2 (two) times daily with a meal.       Allergies: No Known Allergies  Family History:       Family  History  Problem Relation Age of Onset  . Diabetes Brother   . Kidney disease Brother    Social History: reports that he has been smoking cigarettes. He has a 48.00 pack-year smoking history. He has never used smokeless tobacco. He reports current alcohol use. He reports that he does not use drugs.  ROS:  UROLOGY  Frequent Urination?: No  Hard to postpone urination?: No  Burning/pain with urination?: No  Get up at night to urinate?: No  Leakage of urine?: No  Urine stream starts and stops?: No  Trouble starting stream?: No  Do you have to strain to urinate?: No  Blood in urine?: No  Urinary tract infection?: No  Sexually transmitted disease?: No  Injury to kidneys or bladder?: No  Painful intercourse?: No  Weak stream?: No  Erection problems?: No  Penile pain?: No  Gastrointestinal  Nausea?: No  Vomiting?: No  Indigestion/heartburn?: No  Diarrhea?: No  Constipation?: No  Constitutional  Fever: No  Night sweats?: No  Weight loss?: No  Fatigue?: No  Skin  Skin rash/lesions?: No  Itching?: No  Eyes  Blurred vision?: No  Double vision?: No  Ears/Nose/Throat  Sore throat?: No  Sinus problems?: No  Hematologic/Lymphatic  Swollen glands?: No  Easy bruising?: No  Cardiovascular  Leg swelling?: No  Chest pain?: No  Respiratory  Cough?: No  Shortness of breath?: No  Endocrine  Excessive thirst?: No  Musculoskeletal  Back pain?: No  Joint pain?: No  Neurological  Headaches?: No  Dizziness?: No  Psychologic  Depression?: No  Anxiety?: No  Physical Exam:  BP (!) 198/107  Pulse 91  Ht 5\' 6"  (1.676 m)  Wt 174 lb (78.9 kg)  BMI 28.08 kg/m  Constitutional: Alert and oriented, No acute distress.  HEENT: [place] AT, moist mucus membranes. Trachea midline, no masses.  Cardiovascular: No clubbing, cyanosis, or edema.  Respiratory: Normal respiratory effort, no increased work of breathing.  GI: Abdomen is soft, nontender, nondistended, no abdominal masses, small fat  containing hernia  Skin: No rashes, bruises or suspicious lesions.  Neurologic: Grossly intact, no focal deficits, moving all 4 extremities.  Psychiatric: Normal mood and affect.  Laboratory Data:  Recent Labs                                               Recent Labs                       Recent Labs                      Pertinent Imaging:  CT scan and bone scan from 08/25/2019 were both personally reviewed. Agree with radiologic interpretation. No evidence of metastatic disease.  Assessment & Plan:  1. Prostate cancer [facility])  69 year old male with newly diagnosed Gleason 4+4 prostate cancer (left) without evidence of metastatic disease on staging in the form of CT abdomen pelvis and bone scan.  The patient was counseled about the natural history of prostate cancer and the standard treatment options that are available for prostate cancer. It was explained to him how his age and life expectancy, clinical stage, Gleason score, and PSA affect his prognosis, the decision to proceed with additional staging studies, as well as how that information influences recommended treatment strategies. We discussed the roles for active surveillance, radiation therapy, surgical therapy, androgen deprivation, as well as ablative therapy options for the treatment of prostate cancer as appropriate to his individual cancer situation. We discussed the risks and benefits of these options with regard to their impact on cancer control and also in terms of potential adverse events, complications, and impact on quality of life particularly related to urinary, bowel, and sexual function. The patient was encouraged to ask questions throughout the discussion today and all questions were answered to his stated satisfaction. In addition, the patient was provided with and/or directed to appropriate resources and literature for further education about prostate cancer treatment options.  We discussed surgical therapy for  prostate cancer including the different available surgical approaches. Specifically, we discussed robotic prostatectomy with pelvic lymph node dissection based on his restratification. We discussed, in detail, the risks and expectations of surgery with regard to cancer control, urinary control, and erectile dysfunction as well as expected post operative recovery processed. Additional risks of surgery including but not limited to bleeding, infection, hernia formation, nerve damage, fistula formation, bowel/rectal injury, potentially necessitating colostomy, damage to the urinary tract resulting in urinary leakage, urethral stricture, and cardiopulmonary risk such as myocardial infarction, stroke, death, thromboembolism etc. were explained.  He was also offered referral to radiation oncology to discuss radiation with ADT which would be recommended for 2 to 3 years given his high rate stratification. He declined this and is leaning toward surgery.  We will follow-up with him next week after he has had more time to discuss this with his daughter and read through the literature. All additional questions were answered today.  If he does decide to proceed, we can plan to place a stitch in his umbilical hernia to close the small asymptomatic fat-containing fascial defect. He is agreeable this plan.  In addition to the above, we plan for nonnerve sparing on the left and possible full versus partial nerve sparing on the right. Bilateral pelvic lymph node dissection would also be recommended at the time of robotic prostatectomy.  Recommend preoperative physical therapy to strengthen pelvic floor muscles.  -  Ambulatory referral to Physical Therapy  2. Umbilical hernia without obstruction and without gangrene  As above  Hollice Espy, MD  [facility]  [address] [place]  [place],  [address]  [phone]

## 2019-10-30 NOTE — Anesthesia Postprocedure Evaluation (Signed)
Anesthesia Post Note  Patient: Allen Massey  Procedure(s) Performed: XI ROBOTIC ASSISTED LAPAROSCOPIC RADICAL PROSTATECTOMY (N/A ) PELVIC LYMPH NODE DISSECTION (Bilateral ) HERNIA REPAIR UMBILICAL ADULT (N/A )  Patient location during evaluation: PACU Anesthesia Type: General Level of consciousness: awake and alert and oriented Pain management: pain level controlled Vital Signs Assessment: post-procedure vital signs reviewed and stable Respiratory status: spontaneous breathing, nonlabored ventilation and respiratory function stable Cardiovascular status: blood pressure returned to baseline and stable Postop Assessment: no signs of nausea or vomiting Anesthetic complications: no     Last Vitals:  Vitals:   10/30/19 1210 10/30/19 1215  BP: (!) 182/92 (!) 186/96  Pulse: 92 93  Resp: 19 15  Temp:  36.6 C  SpO2: 93% 94%    Last Pain:  Vitals:   10/30/19 1215  TempSrc:   PainSc: 5                  Larrie Lucia

## 2019-10-30 NOTE — Op Note (Signed)
10/30/19  PREOPERATIVE DIAGNOSIS: Prostate cancer.  POSTOPERATIVE DIAGNOSIS: Prostate cancer.  OPERATION PERFORMED: 1. DaVinci laparoscopic radical prostatectomy (bilateral non nerve sparing) 2 DaVinci laproscopic bilateral pelvic lymph node dissection. 3.  Umbilical hernia repair  SURGEON: Hollice Espy, MD  ASSISTANTS: Nickolas Madrid, MD  ANESTHESIA: General.  EBL: 100 cc  SPECIMEN: Prostate with bilateral seminal vesicals, bilateral pelvic lymph nodes, anterior fat pad, posterior apical margin  FINDINGS: Accessory Pudendal Vessel: none  INDICATION: Pt.is a 69 year old male with Gleason 4+4 prostate cancer. Treatment options were discussed with him at length and he chose DaVinci radical prostatectomy.  Due to the high risk nature of his to his disease, we did elect to pursue nonobstructing on the left with intraoperative determination of nerve sparing on the right. Bilateral pelvic lymph node dissection was planned due to his risk stratification.  PROCEDURE IN DETAIL: Patient was given Ancef preoperatively. He had sequential compression devices applied preoperatively for DVT prophylaxis. He was taken to the operating room where he was induced with general anesthesia. After adequate anesthesia, he was placed in the dorsal lithotomy position. His arms were draped by his side and was appropriately padded and secured to the operating room table. He was placed in the Trendelenburg position.  He was prepped and draped in sterile fashion. An 89 French Foley was placed in the bladder and instilled with 15 cc sterile water. Orogastric tube was placed. The Veress needle was passed just above the umbilicus and the abdomen was insufflated to 15 atmospheres. A 12 mm, blunt-tip trocar was placed just above the umbilicus. The zero-degree camera was passed within this and the following trocars were placed under direct vision; 8 mm robotic trocars were placed 9 cm laterally and  inferiorly to the initially placed umbilical trocar. A third one was placed 7 cm lateral to the left-sided trocar. In the corresponding position on the right side, a 12 mm trocar was placed, and then a 5 mm trocar was placed to the right and well above the umbilicus.  The robot was then docked with the robot trocar. I used the zero-degree camera. I had the hot scissors in the right hand and the left hand with the [place] bipolar and far left hand the Prograsp forceps. Initially I divided the median umbilical ligament bilaterally and the urachus and developed the space of Retzius down to pubic bone. I divided the parietal peritoneum laterally up to the vas deferens on each side. I used the Cardier forceps to provide cranial traction on the urachus. I cleaned off the Endopelvic fascia on each side and then divided it with the scissors laterally to the perirectal fat and medially to the puboprostatic ligaments which were divided. I then ligated the dorsal vein complex using a 60 mm vascular load stapler.   I then addressed the bladder neck with a 30-degree down lens. I identified the bladder neck by pulling on the Foley catheter. I divided the anterior bladder neck musculature until I then found the anterior bladder neck mucosa which was incised. I identified the Foley catheter within, deflated the balloon, pulled the Foley out through this opening and then using the Carter-Thomason needle with a #0-Vicryl suture, passed through The suprapubic region and pulled the suture through the eye of the Foley and then back out. This allowed me to provide upward traction on the prostate. I then divided the lateral bladder neck mucosa and the posterior bladder neck mucosa. I was well away from ureteral orifices. I divided the posterior bladder neck  musculature.  The right SP was immediately encountered and dissected laterally towards the pedicle.  Bilateral vas deferens were never identified and  felt to be likely atrophic or even possibly absent.  On the left, a white spherical structure which appeared to be consistent with the seminal vesicle was encountered but unable to be grasped due to its relatively firm and spherical nature.  Is felt this likely represented the right SV.  In order to free this up, it was dissected out posteriorly and the pedicle was taken in the standard fashion using Weck clips to free of the corner of the prostate.  I then went back to the 0-degree lens. I divided the Denonvilliers fascia beneath the prostate and developed the prostate off the rectum.   I continued to divide the neurovascular bundles off the prostate out to the apex of the prostate. At this point the prostate was freed up except for the urethra. I addressed the prostate anteriorly, divided the dorsal vein , then the anterior urethral wall, pulled the Foley catheter back and then divided the posterior urethral wall. Specimen was completely freed up. I placed the prostate in an Endo catch bag and then placed the bag in the upper abdomen out of the way. I then irrigated the pelvis. The rectal test was negative. There was reasonable hemostasis.  I then did the pelvic lymph node dissection by incising the fascia overlying the right external iliac vein, dissecting distally. I went just distal to the node of [place] where we placed clips and then divided the lymphatics. The lateral aspect of the dissection was the pelvic side wall, inferior was the obturator nerve and proximal the hypogastric vessels. I placed clips at the proximal aspect and then divided the lymphatics. This was removed with the spoon grasper and sent to pathology.   I then did the left obturator lymph node dissection in the same fashion as the left side.  With good hemostasis, I then did the posterior reconstruction. I used a 3-0 VLoc suture on an RB1 through the cut edge of Denonvilliers fascia beneath the bladder on the  right side and through the posterior striated sphincter underneath the urethra. This brought the bladder neck and urethra and closer proximity to help facilitate anastomosis.   I then did the urethral vesicle anastomosis again with two 3-0 VLoc sutures on an RB1 needle interlocked. I passed both ends of the suture from the outside-in through the bladder neck at the 6 o'clock position. I passed both through the urethral stump from the inside-out in the corresponding position. I reapproximated the bladder neck to the urethra. I then ran the Left suture on the left side anastomosis to the 9 o'clock position. Then I went back to the right sided suture and ran that up the right side to the 12 o'clock position. I then continued the left suture to the 12 o'clock position.The suture was then suspended anteriorly behind the pubic bone.   I then placed a new 31 French Foley into the bladder and filled it with 10 cc sterile water. I irrigated the bladder with 160 cc. There was no leakage. There was reasonable hemostasis.  Surgicel was used on either side of the pedicles for an additional hemostasis.  The instruments were then removed. The robot was undocked and all the trocars were removed under direct vision. There was good hemostasis. I then enlarged the umbilical trocar site large enough to remove the prostate.  The umbilical stalk and fascia around the umbilical  hernia was freed up.  Use a Coker to grasp the fascia just below on the inferior portion of the umbilical hernia.  I closed the umbilical wound incorporating the lower edge of the fascia thereby closing the umbilical hernia.  I used a 0 Ethibond to achieve this.  Once closed, the fascia was intact and there was no further defect within the umbilicus.  All the port sites were irrigated. Lidocaine was injected into all the trocar sites. The skin was closed with 4-0 Monocryl in running subcuticular fashion. Dermabond was applied.   At  this point patient was awakened and extubated in the operating room and taken to the recovery room in stable condition. There were no complications. All counts correct.  Hollice Espy, MD

## 2019-10-30 NOTE — Anesthesia Preprocedure Evaluation (Signed)
Anesthesia Evaluation  Patient identified by MRN, date of birth, ID band Patient awake    Reviewed: Allergy & Precautions, NPO status , Patient's Chart, lab work & pertinent test results  History of Anesthesia Complications Negative for: history of anesthetic complications  Airway Mallampati: II  TM Distance: >3 FB Neck ROM: Full    Dental  (+) Edentulous Upper, Edentulous Lower   Pulmonary neg sleep apnea, neg COPD, Current Smoker and Patient abstained from smoking.,    breath sounds clear to auscultation- rhonchi (-) wheezing      Cardiovascular hypertension, Pt. on medications + Peripheral Vascular Disease  (-) CAD, (-) Past MI, (-) Cardiac Stents and (-) CABG  Rhythm:Regular Rate:Normal - Systolic murmurs and - Diastolic murmurs    Neuro/Psych CVA (R leg weakness), Residual Symptoms negative psych ROS   GI/Hepatic negative GI ROS, Neg liver ROS,   Endo/Other  diabetes, Oral Hypoglycemic Agents  Renal/GU negative Renal ROS     Musculoskeletal negative musculoskeletal ROS (+)   Abdominal (+) - obese,   Peds  Hematology negative hematology ROS (+)   Anesthesia Other Findings Past Medical History: No date: Cancer ([place])     Comment:  PROSTATE No date: Diabetes mellitus without complication ([facility]) No date: Hyperlipidemia No date: Hypertension 1990'S: Stroke ([place])     Comment:  MINI STROKE   Reproductive/Obstetrics                             Anesthesia Physical Anesthesia Plan  ASA: III  Anesthesia Plan: General   Post-op Pain Management:    Induction: Intravenous  PONV Risk Score and Plan: 0 and Ondansetron and Dexamethasone  Airway Management Planned: Oral ETT  Additional Equipment:   Intra-op Plan:   Post-operative Plan: Extubation in OR  Informed Consent: I have reviewed the patients History and Physical, chart, labs and discussed the procedure including the risks,  benefits and alternatives for the proposed anesthesia with the patient or authorized representative who has indicated his/her understanding and acceptance.     Dental advisory given  Plan Discussed with: CRNA and Anesthesiologist  Anesthesia Plan Comments:         Anesthesia Quick Evaluation

## 2019-10-30 NOTE — Anesthesia Procedure Notes (Signed)
Procedure Name: Intubation Date/Time: 10/30/2019 7:39 AM Performed by: Allean Found, CRNA Pre-anesthesia Checklist: Patient identified, Patient being monitored, Timeout performed, Emergency Drugs available and Suction available Patient Re-evaluated:Patient Re-evaluated prior to induction Oxygen Delivery Method: Circle system utilized Preoxygenation: Pre-oxygenation with 100% oxygen Induction Type: IV induction Ventilation: Mask ventilation without difficulty Laryngoscope Size: McGraph and 4 Grade View: Grade II Tube type: Oral Tube size: 7.0 mm Number of attempts: 1 Airway Equipment and Method: Stylet Placement Confirmation: ETT inserted through vocal cords under direct vision,  positive ETCO2 and breath sounds checked- equal and bilateral Secured at: 23 cm Tube secured with: Tape Dental Injury: Teeth and Oropharynx as per pre-operative assessment

## 2019-10-31 DIAGNOSIS — C61 Malignant neoplasm of prostate: Secondary | ICD-10-CM | POA: Diagnosis not present

## 2019-10-31 LAB — BASIC METABOLIC PANEL
Anion gap: 8 (ref 5–15)
BUN: 10 mg/dL (ref 8–23)
CO2: 27 mmol/L (ref 22–32)
Calcium: 8.3 mg/dL — ABNORMAL LOW (ref 8.9–10.3)
Chloride: 105 mmol/L (ref 98–111)
Creatinine, Ser: 0.97 mg/dL (ref 0.61–1.24)
GFR calc Af Amer: >60 mL/min (ref >60)
GFR calc non Af Amer: >60 mL/min (ref >60)
Glucose, Bld: 141 mg/dL — ABNORMAL HIGH (ref 70–99)
Potassium: 3.5 mmol/L (ref 3.5–5.1)
Sodium: 140 mmol/L (ref 135–145)

## 2019-10-31 LAB — CBC
HCT: 43.3 % (ref 39.0–52.0)
Hemoglobin: 14.6 g/dL (ref 13.0–17.0)
MCH: 31.1 pg (ref 26.0–34.0)
MCHC: 33.7 g/dL (ref 30.0–36.0)
MCV: 92.3 fL (ref 80.0–100.0)
Platelets: 188 10*3/uL (ref 150–400)
RBC: 4.69 MIL/uL (ref 4.22–5.81)
RDW: 12.3 % (ref 11.5–15.5)
WBC: 9.4 10*3/uL (ref 4.0–10.5)
nRBC: 0 % (ref 0.0–0.2)

## 2019-10-31 LAB — GLUCOSE, CAPILLARY
Glucose-Capillary: 131 mg/dL — ABNORMAL HIGH (ref 70–99)
Glucose-Capillary: 139 mg/dL — ABNORMAL HIGH (ref 70–99)

## 2019-10-31 MED ORDER — OXYBUTYNIN CHLORIDE 5 MG PO TABS: 5.0000 mg | ORAL_TABLET | Freq: Three times a day (TID) | ORAL | 0 refills | Status: AC | PRN

## 2019-10-31 MED ORDER — HYDRALAZINE HCL 20 MG/ML IJ SOLN
10.0000 mg | Freq: Four times a day (QID) | INTRAMUSCULAR | Status: DC | PRN
  Administered 2019-10-31: 10 mg via INTRAVENOUS
  Filled 2019-10-31: qty 1

## 2019-10-31 MED ORDER — AMLODIPINE BESYLATE 10 MG PO TABS: 10.0000 mg | ORAL_TABLET | ORAL | Status: DC | PRN

## 2019-10-31 MED ORDER — OXYCODONE-ACETAMINOPHEN 5-325 MG PO TABS: 1.0000 | ORAL_TABLET | Freq: Four times a day (QID) | ORAL | 0 refills | Status: AC | PRN

## 2019-10-31 MED ORDER — HYDRALAZINE HCL 10 MG PO TABS: 10.0000 mg | ORAL_TABLET | ORAL | Status: DC | PRN

## 2019-10-31 MED ORDER — DOCUSATE SODIUM 100 MG PO CAPS: 100.0000 mg | ORAL_CAPSULE | Freq: Two times a day (BID) | ORAL | 0 refills | Status: DC

## 2019-10-31 NOTE — Care Management Obs Status (Signed)
[place] NOTIFICATION   Patient Details  Name: Allen Massey MRN: [id number] Date of Birth: 03/07/1951   Medicare Observation Status Notification Given:  Yes    Beverly Sessions, RN 10/31/2019, 12:12 PM

## 2019-10-31 NOTE — Care Management Obs Status (Signed)
[place] NOTIFICATION   Patient Details  Name: Allen Massey MRN: [id number] Date of Birth: [birth year]   Medicare Observation Status Notification Given:  Yes    Beverly Sessions, RN 10/31/2019, 12:13 PM

## 2019-10-31 NOTE — Discharge Summary (Signed)
Date of admission: 10/30/2019  Date of discharge: 10/31/2019  Admission diagnosis: Prostate cancer, umbilical hernia  Discharge diagnosis: Same as above  Secondary diagnoses:  Patient Active Problem List   Diagnosis Date Noted  . Prostate cancer ([place]) 10/30/2019  . Carotid stenosis 05/15/2019  . 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 Essential hypertension 05/15/2019  . Diabetes ([place]) 05/15/2019    History and Physical: For full details, please see admission history and physical. Briefly, Allen Massey is a 69 y.o. year old patient admitted on 10/30/2019 for scheduled robotic radical prostatectomy, bilateral nonnerve sparing, with bilateral pelvic lymph node dissection and umbilical hernia repair.   Hospital Course: Patient tolerated the procedure well.  He was then transferred to the floor after an uneventful PACU stay.  His hospital course was uncomplicated.  On POD#1 he had met discharge criteria: was eating a regular diet, was up and ambulating independently,  pain was well controlled, and was ready for discharge with Foley catheter in place.  Laboratory values:  Recent Labs    10/31/19 0448  WBC 9.4  HGB 14.6  HCT 43.3   Recent Labs    10/31/19 0448  NA 140  K 3.5  CL 105  CO2 27  GLUCOSE 141*  BUN 10  CREATININE 0.97  CALCIUM 8.3*   Results for orders placed or performed during the hospital encounter of 10/26/19  SARS CORONAVIRUS 2 (TAT 6-24 HRS) Nasopharyngeal Nasopharyngeal Swab     Status: None   Collection Time: 10/26/19  9:40 AM   Specimen: Nasopharyngeal Swab  Result Value Ref Range Status   SARS Coronavirus 2 NEGATIVE NEGATIVE Final    Comment: (NOTE) SARS-CoV-2 target nucleic acids are NOT DETECTED. The SARS-CoV-2 RNA is generally detectable in upper and lower respiratory specimens during the acute phase of infection. Negative results do not preclude SARS-CoV-2 infection, do not rule out co-infections  with other pathogens, and should not be used as the sole basis for treatment or other patient management decisions. Negative results must be combined with clinical observations, patient history, and epidemiological information. The expected result is Negative. Fact Sheet for Patients: SugarRoll.be Fact Sheet for Healthcare Providers: https://www.woods-mathews.com/ This test is not yet approved or cleared by the [place] FDA and  has been authorized for detection and/or diagnosis of SARS-CoV-2 by FDA under an Emergency Use Authorization (EUA). This EUA will remain  in effect (meaning this test can be used) for the duration of the COVID-19 declaration under Section 56 4(b)(1) of the Act, 21 U.S.C. section 360bbb-3(b)(1), unless the authorization is terminated or revoked sooner. Performed at [facility], [place] [address] [place], [place] [address]    Disposition: Home  Discharge instruction: The patient was instructed to be ambulatory but told to refrain from heavy lifting, strenuous activity, or driving. He was provided with catheter care instructions with plans for catheter removal in 1 week.  Discharge medications:  Allergies as of 10/31/2019   No Known Allergies     Medication List    TAKE these medications   aspirin EC 81 MG tablet Take 81 mg by mouth daily.   atorvastatin 40 MG tablet Commonly known as: LIPITOR Take 40 mg by mouth at bedtime.   brimonidine 0.2 % ophthalmic solution Commonly known as: ALPHAGAN Place 1 drop into both eyes 3 (three) times daily.   docusate sodium 100 MG capsule Commonly known as: COLACE Take 1 capsule (100 mg total) by mouth 2 (two) times daily.  dorzolamide-timolol 22.3-6.8 MG/ML ophthalmic solution Commonly known as: COSOPT Place 1 drop into both eyes 2 (two) times daily.   latanoprost 0.005 % ophthalmic solution Commonly known as: XALATAN Place 1 drop into both eyes at  bedtime.   losartan 25 MG tablet Commonly known as: COZAAR Take 25 mg by mouth every morning.   metFORMIN 1000 MG tablet Commonly known as: GLUCOPHAGE Take 1,000 mg by mouth 2 (two) times daily.   oxybutynin 5 MG tablet Commonly known as: DITROPAN Take 1 tablet (5 mg total) by mouth every 8 (eight) hours as needed for up to 10 days for bladder spasms.   oxyCODONE-acetaminophen 5-325 MG tablet Commonly known as: PERCOCET/ROXICET Take 1-2 tablets by mouth every 6 (six) hours as needed for up to 5 days for moderate pain.       Followup:  1 week catheter removal, 6 week post-op follow-up with pathology results and PSA prior as below. Future Appointments  Date Time Provider [place]  11/07/2019  9:00 AM McGowan, Larene Beach A, PA-C [facility] None  12/12/2019  2:00 PM [facility] None  12/13/2019 10:15 AM Hollice Espy, MD [facility] None

## 2019-10-31 NOTE — Progress Notes (Signed)
Pt discharged per MD order. IV removed. Discharge instructions reviewed with pt. Catheter care instructions reviewed with pt. Pt switched to leg bag and given and standard bag for overnight. Pt verbalized understanding with all questions answered to pt satisfaction. Pt taken to car in wheelchair by staff.

## 2019-10-31 NOTE — Progress Notes (Signed)
Urology Inpatient Progress Note  Subjective: Daily Allen Massey is a 69 y.o. male admitted on 10/30/2019 for scheduled robotic radical prostatectomy with bilateral pelvic lymph node dissection and umbilical hernia repair with Dr. Erlene Quan.  Creatinine and hemoglobin WNL.  Today, he reports feeling well.  He has ambulated.  He is tolerating food without nausea or vomiting.  He has not yet passed flatus or had a bowel movement.  Anti-infectives: Anti-infectives (From admission, onward)   Start     Dose/Rate Route Frequency Ordered Stop   10/30/19 1230  ceFAZolin (ANCEF) IVPB 1 g/50 mL premix     1 g 100 mL/hr over 30 Minutes Intravenous Every 8 hours 10/30/19 1221 10/31/19 0429   10/30/19 0620  ceFAZolin (ANCEF) 2-4 GM/100ML-% IVPB    Note to Pharmacy: Norton Blizzard  : cabinet override      10/30/19 0620 10/30/19 0749   10/30/19 0555  ceFAZolin (ANCEF) IVPB 2g/100 mL premix     2 g 200 mL/hr over 30 Minutes Intravenous 30 min pre-op 10/30/19 0555 10/30/19 0739      Current Facility-Administered Medications  Medication Dose Route Frequency Provider Last Rate Last Admin  . 0.9 %  sodium chloride infusion   Intravenous Continuous Hollice Espy, MD 100 mL/hr at 10/31/19 1038 Rate Verify at 10/31/19 1038  . acetaminophen (TYLENOL) tablet 650 mg  650 mg Oral Q4H PRN Hollice Espy, MD      . atorvastatin (LIPITOR) tablet 40 mg  40 mg Oral QHS Hollice Espy, MD   40 mg at 10/30/19 2206  . brimonidine (ALPHAGAN) 0.2 % ophthalmic solution 1 drop  1 drop Both Eyes TID Hollice Espy, MD   1 drop at 10/31/19 0937  . Chlorhexidine Gluconate Cloth 2 % PADS 6 each  6 each Topical Daily Hollice Espy, MD   6 each at 10/31/19 [phone]  . diphenhydrAMINE (BENADRYL) injection 12.5 mg  12.5 mg Intravenous Q6H PRN Hollice Espy, MD       Or  . diphenhydrAMINE (BENADRYL) 12.5 MG/5ML elixir 12.5 mg  12.5 mg Oral Q6H PRN Hollice Espy, MD      . docusate sodium (COLACE) capsule 100 mg  100 mg Oral  BID Hollice Espy, MD   100 mg at 10/31/19 0936  . dorzolamide-timolol (COSOPT) 22.3-6.8 MG/ML ophthalmic solution 1 drop  1 drop Both Eyes BID Hollice Espy, MD   1 drop at 10/31/19 0936  . heparin injection 5,000 Units  5,000 Units Subcutaneous Q8H Hollice Espy, MD   5,000 Units at 10/31/19 0617  . hydrALAZINE (APRESOLINE) injection 10 mg  10 mg Intravenous Q6H PRN Hollice Espy, MD   10 mg at 10/31/19 0149  . insulin aspart (novoLOG) injection 0-15 Units  0-15 Units Subcutaneous TID WC Hollice Espy, MD   2 Units at 10/31/19 [phone]  . insulin aspart (novoLOG) injection 0-5 Units  0-5 Units Subcutaneous QHS Hollice Espy, MD   2 Units at 10/30/19 2323  . insulin aspart (novoLOG) injection 4 Units  4 Units Subcutaneous TID WC Hollice Espy, MD   4 Units at 10/31/19 [phone]  . latanoprost (XALATAN) 0.005 % ophthalmic solution 1 drop  1 drop Both Eyes QHS Hollice Espy, MD   1 drop at 10/30/19 2208  . losartan (COZAAR) tablet 25 mg  25 mg Oral Maryclare Labrador, MD   25 mg at 10/31/19 0936  . ondansetron (ZOFRAN) injection 4 mg  4 mg Intravenous Q4H PRN Hollice Espy, MD      . oxybutynin [facility]) tablet 5  mg  5 mg Oral Q8H PRN Hollice Espy, MD   5 mg at 10/30/19 1145  . oxyCODONE-acetaminophen (PERCOCET/ROXICET) 5-325 MG per tablet 1-2 tablet  1-2 tablet Oral Q4H PRN Hollice Espy, MD   2 tablet at 10/31/19 0943  . zolpidem (AMBIEN) tablet 5 mg  5 mg Oral QHS PRN Hollice Espy, MD       Objective: Vital signs in last 24 hours: Temp:  [97.7 F (36.5 C)-100.2 F (37.9 C)] 100.2 F (37.9 C) (01/26 0813) Pulse Rate:  [82-96] 92 (01/26 0813) Resp:  [15-26] 19 (01/26 0813) BP: (143-191)/(81-132) 163/91 (01/26 0813) SpO2:  [93 %-97 %] 97 % (01/26 0813)  Intake/Output from previous day: 01/25 0701 - 01/26 0700 In: 1120.1 [I.V.:1020.1; IV Piggyback:100] Out: [id number] [Urine:4550; Blood:100] Intake/Output this shift: Total I/O In: 1391.7 [I.V.:1391.7] Out: -   Physical  Exam Vitals and nursing note reviewed.  Constitutional:      General: He is not in acute distress.    Appearance: He is not ill-appearing, toxic-appearing or diaphoretic.  HENT:     Head: Normocephalic and atraumatic.  Pulmonary:     Effort: Pulmonary effort is normal. No respiratory distress.  Abdominal:     General: There is no distension.     Tenderness: There is abdominal tenderness (Surgical site). There is no guarding or rebound.     Comments: Scattered surgical incisions visualized over the anterior abdomen, all clean, dry, and with intact overlying surgical adhesive.  Skin:    General: Skin is warm and dry.  Neurological:     Mental Status: He is alert.    Lab Results:  Recent Labs    10/31/19 0448  WBC 9.4  HGB 14.6  HCT 43.3  PLT 188   BMET Recent Labs    10/31/19 0448  NA 140  K 3.5  CL 105  CO2 27  GLUCOSE 141*  BUN 10  CREATININE 0.97  CALCIUM 8.3*   Assessment & Plan: 69 year old male with Gleason 4+4 prostate cancer now POD1 from robotic radical prostatectomy with bilateral pelvic lymph node dissection and umbilical hernia repair.  Labs stable this morning.  Patient progressing well following surgery and ready for discharge, having been ambulatory with adequate pain control and tolerating solid foods.  We will plan for discharge this afternoon.   Debroah Loop, PA-C 10/31/2019

## 2019-11-01 LAB — SURGICAL PATHOLOGY

## 2019-11-03 ENCOUNTER — Telehealth: Payer: Self-pay | Admitting: Urology

## 2019-11-03 NOTE — Telephone Encounter (Signed)
Someone who introduced herself as Levada Dy the pt.'s caregiver. Levada Dy stated she is with pt. Right now and he has not had a bowel movement since surgery on 1/25/21and pt.is very uncomfortable. She would like for someone to call her back at [phone] (pt is there). Levada Dy is not on DPR but she can put the pt. On the phone. Pt. Has post op appt. On 11/07/19 for Catheter removal.

## 2019-11-03 NOTE — Telephone Encounter (Signed)
Spoke with patient, he has been in some pain due to constipation. No other symptoms. He has been taking Colace.

## 2019-11-03 NOTE — Telephone Encounter (Signed)
Please counsel him to push fluids, ambulate, and augment Colace with Miralax 1 capful daily.

## 2019-11-03 NOTE — Telephone Encounter (Signed)
Patient informed, reviewed instructions, patient voiced understanding. Will seek medical attention if symptoms worsen.

## 2019-11-07 ENCOUNTER — Other Ambulatory Visit: Payer: Self-pay

## 2019-11-07 ENCOUNTER — Ambulatory Visit: Payer: Medicare Other | Admitting: Urology

## 2019-11-07 ENCOUNTER — Encounter: Payer: Self-pay | Admitting: Urology

## 2019-11-07 ENCOUNTER — Ambulatory Visit (INDEPENDENT_AMBULATORY_CARE_PROVIDER_SITE_OTHER): Payer: Medicare Other | Admitting: Urology

## 2019-11-07 DIAGNOSIS — C61 Malignant neoplasm of prostate: Secondary | ICD-10-CM

## 2019-11-07 NOTE — Progress Notes (Signed)
Allen Massey is a 69 year old male who is s/p bilateral pelvic lymph node dissection and robotic prostatectomy with umbilical hernia repair on 10/30/2019 with Dr. Erlene Quan.  Catheter Removal  Patient is present today for a catheter removal.  10 ml of water was drained from the balloon. A 18 FR foley cath was removed from the bladder no complications were noted . Patient tolerated well.  Performed by: Zara Council, PA-C  Follow up/ Additional notes: Patient is eating and drinking.  He is having BM's.  He denies any nausea or vomiting, fevers or chills.  Urine in the leg bag is yellow clear.  He has a bruise in the left flank that his caregiver states has been there since after the surgery.  He has no flank pain or CVA tenderness on exam.   Reassured patient that this can be expected after surgery and that it should fade over the next few weeks.    He should keep his follow up appointment in March with Dr. Erlene Quan and continue with lifting and ADL's restrictions.

## 2019-11-15 ENCOUNTER — Telehealth: Payer: Self-pay | Admitting: Urology

## 2019-11-15 NOTE — Telephone Encounter (Signed)
Pt. Had friend Levada Dy) call and ask if our office can order or prescribe depends because he can not control his urine since his catheter removal on 11/07/19. Please call pt. and advise.

## 2019-11-15 NOTE — Telephone Encounter (Addendum)
Provided name and number of [facility] to assist with needs of Depends.  Burr Medico [phone] Patient voiced understanding.

## 2019-12-06 ENCOUNTER — Other Ambulatory Visit: Payer: Self-pay

## 2019-12-06 ENCOUNTER — Ambulatory Visit (INDEPENDENT_AMBULATORY_CARE_PROVIDER_SITE_OTHER): Payer: Medicare Other

## 2019-12-06 ENCOUNTER — Ambulatory Visit (INDEPENDENT_AMBULATORY_CARE_PROVIDER_SITE_OTHER): Payer: Medicare Other | Admitting: Nurse Practitioner

## 2019-12-06 ENCOUNTER — Encounter (INDEPENDENT_AMBULATORY_CARE_PROVIDER_SITE_OTHER): Payer: Self-pay | Admitting: Nurse Practitioner

## 2019-12-06 VITALS — BP 130/85 | HR 100 | Resp 16 | Ht 67.0 in | Wt 165.0 lb

## 2019-12-06 DIAGNOSIS — I6523 Occlusion and stenosis of bilateral carotid arteries: Secondary | ICD-10-CM | POA: Diagnosis not present

## 2019-12-06 DIAGNOSIS — I70219 Atherosclerosis of native arteries of extremities with intermittent claudication, unspecified extremity: Secondary | ICD-10-CM | POA: Diagnosis not present

## 2019-12-06 DIAGNOSIS — L602 Onychogryphosis: Secondary | ICD-10-CM

## 2019-12-07 ENCOUNTER — Encounter (INDEPENDENT_AMBULATORY_CARE_PROVIDER_SITE_OTHER): Payer: Self-pay | Admitting: Nurse Practitioner

## 2019-12-07 NOTE — Progress Notes (Signed)
SUBJECTIVE:  Patient ID: Allen Massey, male    DOB: 1950-12-22, 69 y.o.   MRN: [id number] Chief Complaint  Patient presents with  . Follow-up    U/S [place] up    HPI  Allen Massey is a 69 y.o. male The patient returns to the office for followup and review of the noninvasive studies. There have been no interval changes in lower extremity symptoms.  The patient does endorse having some claudication-like symptoms however they are tolerable for him at this time.  No new ulcers or wounds have occurred since the last visit.  There have been no significant changes to the patient's overall health care.  The patient denies amaurosis fugax or recent TIA symptoms. There are no recent neurological changes noted. The patient denies history of DVT, PE or superficial thrombophlebitis. The patient denies recent episodes of angina or shortness of breath.   ABI Rt=0.76 and Lt=0.91  (previous ABI's Rt=0.73 and Lt=0.65) Duplex ultrasound of the bilateral lower extremities reveal monophasic tibial artery waveforms.  The left digit waveforms are slightly dampened where the right digit is more severely dampened.   The patient is seen for follow up evaluation of carotid stenosis. The carotid stenosis followed by ultrasound.   There is a history of hyperlipidemia which is being treated with a statin.    Carotid Duplex done today shows 1 to 39% stenosis of the right internal carotid artery with a total occlusion of the left ICA.  No change compared to last study in 06/07/2019.  Past Medical History:  Diagnosis Date  . Cancer ([place])    PROSTATE  . Diabetes mellitus without complication ([place])   . Hyperlipidemia   . Hypertension   . Stroke [facility]) 1990'S   MINI STROKE    Past Surgical History:  Procedure Laterality Date  . Colon cancer removal  10/30/2019  . NO PAST SURGERIES    . PELVIC LYMPH NODE DISSECTION Bilateral 10/30/2019   Procedure: PELVIC LYMPH NODE DISSECTION;  Surgeon: Hollice Espy,  MD;  Location: [facility] ORS;  Service: Urology;  Laterality: Bilateral;  . ROBOT ASSISTED LAPAROSCOPIC RADICAL PROSTATECTOMY N/A 10/30/2019   Procedure: XI ROBOTIC ASSISTED LAPAROSCOPIC RADICAL PROSTATECTOMY;  Surgeon: Hollice Espy, MD;  Location: [facility] ORS;  Service: Urology;  Laterality: N/A;  . UMBILICAL HERNIA REPAIR N/A 10/30/2019   Procedure: HERNIA REPAIR UMBILICAL ADULT;  Surgeon: Hollice Espy, MD;  Location: [facility] ORS;  Service: Urology;  Laterality: N/A;    Social History   Socioeconomic History  . Marital status: Married    Spouse name: Not on file  . Number of children: Not on file  . Years of education: Not on file  . Highest education level: Not on file  Occupational History  . Not on file  Tobacco Use  . Smoking status: Current Some Day Smoker    Packs/day: 0.25    Years: 48.00    Pack years: 12.00    Types: Cigarettes  . Smokeless tobacco: Never Used  Substance and Sexual Activity  . Alcohol use: Yes    Comment: OCC  . Drug use: No  . Sexual activity: Not on file  Other Topics Concern  . Not on file  Social History Narrative  . Not on file   Social Determinants of Health   Financial Resource Strain:   . Difficulty of Paying Living Expenses: Not on file  Food Insecurity:   . Worried About Charity fundraiser in the Last Year: Not on file  . Ran Out of  Food in the Last Year: Not on file  Transportation Needs:   . Lack of Transportation (Medical): Not on file  . Lack of Transportation (Non-Medical): Not on file  Physical Activity:   . Days of Exercise per Week: Not on file  . Minutes of Exercise per Session: Not on file  Stress:   . Feeling of Stress : Not on file  Social Connections:   . Frequency of Communication with Friends and Family: Not on file  . Frequency of Social Gatherings with Friends and Family: Not on file  . Attends Religious Services: Not on file  . Active Member of Clubs or Organizations: Not on file  . Attends Archivist  Meetings: Not on file  . Marital Status: Not on file  Intimate Partner Violence:   . Fear of Current or Ex-Partner: Not on file  . Emotionally Abused: Not on file  . Physically Abused: Not on file  . Sexually Abused: Not on file    Family History  Problem Relation Age of Onset  . Diabetes Brother   . Kidney disease Brother     No Known Allergies   Review of Systems   Review of Systems: Negative Unless Checked Constitutional: [] Weight loss  [] Fever  [] Chills Cardiac: [] Chest pain   []  Atrial Fibrillation  [] Palpitations   [] Shortness of breath when laying flat   [] Shortness of breath with exertion. [] Shortness of breath at rest Vascular:  [] Pain in legs with walking   [] Pain in legs with standing [] Pain in legs when laying flat   [x] Claudication    [] Pain in feet when laying flat    [] History of DVT   [] Phlebitis   [] Swelling in legs   [] Varicose veins   [] Non-healing ulcers Pulmonary:   [] Uses home oxygen   [] Productive cough   [] Hemoptysis   [] Wheeze  [] COPD   [] Asthma Neurologic:  [] Dizziness   [] Seizures  [] Blackouts [] History of stroke   [] History of TIA  [] Aphasia   [] Temporary Blindness   [] Weakness or numbness in arm   [] Weakness or numbness in leg Musculoskeletal:   [] Joint swelling   [] Joint pain   [] Low back pain  []  History of Knee Replacement [] Arthritis [] back Surgeries  []  Spinal Stenosis    Hematologic:  [] Easy bruising  [] Easy bleeding   [] Hypercoagulable state   [] Anemic Gastrointestinal:  [] Diarrhea   [] Vomiting  [] Gastroesophageal reflux/heartburn   [] Difficulty swallowing. [] Abdominal pain Genitourinary:  [] Chronic kidney disease   [] Difficult urination  [] Anuric   [] Blood in urine [] Frequent urination  [] Burning with urination   [] Hematuria Skin:  [] Rashes   [] Ulcers [] Wounds Psychological:  [] History of anxiety   []  History of major depression  []  Memory Difficulties      OBJECTIVE:   Physical Exam  BP 130/85   Pulse 100   Resp 16   Ht 5\' 7"  (1.702 m)    Wt 165 lb (74.8 kg)   BMI 25.84 kg/m   Gen: WD/WN, NAD Head: [place]/AT, No temporalis wasting.  Ear/Nose/Throat: Hearing grossly intact, nares w/o erythema or drainage Eyes: PER, EOMI, sclera nonicteric.  Neck: Supple, no masses.  No JVD.  Pulmonary:  Good air movement, no use of accessory muscles.  Cardiac: RRR Vascular:  Bilateral feet warm good capillary refill Vessel Right Left  Radial Palpable Palpable  Dorsalis Pedis Not Palpable Not Palpable  Posterior Tibial Not Palpable Not Palpable   Gastrointestinal: soft, non-distended. No guarding/no peritoneal signs.  Musculoskeletal: M/S 5/5 throughout.  No deformity or atrophy.  Neurologic: Pain and light touch intact in extremities.  Symmetrical.  Speech is fluent. Motor exam as listed above. Psychiatric: Judgment intact, Mood & affect appropriate for pt's clinical situation. Dermatologic: No Venous rashes. No Ulcers Noted.  No changes consistent with cellulitis. Lymph : No Cervical lymphadenopathy, no lichenification or skin changes of chronic lymphedema.       ASSESSMENT AND PLAN:  1. Overgrown toenails Patient has some overgrown toenails that he is having difficulty with.  Will refer him to podiatry for treatment. - Ambulatory referral to Podiatry  2. Bilateral carotid artery stenosis Recommend:  Given the patient's asymptomatic subcritical stenosis no further invasive testing or surgery at this time.  Duplex ultrasound shows an occlusion of the left internal carotid artery and a 1 to 39% stenosis of the right  Continue antiplatelet therapy as prescribed Continue management of CAD, HTN and Hyperlipidemia Healthy heart diet,  encouraged exercise at least 4 times per week Follow up in 12 months with duplex ultrasound and physical exam   3. 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  The  patient does not voice lifestyle limiting changes at this point in time.  Noninvasive studies do not suggest clinically significant change.  No invasive studies, angiography or surgery at this time The patient should continue walking and begin a more formal exercise program.  The patient should continue antiplatelet therapy and aggressive treatment of the lipid abnormalities  No changes in the patient's medications at this time  The patient should continue wearing graduated compression socks 10-15 mmHg strength to control the mild edema.   Patient will follow up in 6 6 months, sooner if there are changes.   Current Outpatient Medications on File Prior to Visit  Medication Sig Dispense Refill  . aspirin EC 81 MG tablet Take 81 mg by mouth daily.    Marland Kitchen atorvastatin (LIPITOR) 40 MG tablet Take 40 mg by mouth at bedtime.     . brimonidine (ALPHAGAN) 0.2 % ophthalmic solution Place 1 drop into both eyes 3 (three) times daily.    . dorzolamide-timolol (COSOPT) 22.3-6.8 MG/ML ophthalmic solution Place 1 drop into both eyes 2 (two) times daily.     Marland Kitchen latanoprost (XALATAN) 0.005 % ophthalmic solution Place 1 drop into both eyes at bedtime.     Marland Kitchen losartan (COZAAR) 25 MG tablet Take 25 mg by mouth every morning.     . metFORMIN (GLUCOPHAGE) 1000 MG tablet Take 1,000 mg by mouth 2 (two) times daily.     Marland Kitchen docusate sodium (COLACE) 100 MG capsule Take 1 capsule (100 mg total) by mouth 2 (two) times daily. (Patient not taking: Reported on 12/06/2019) 14 capsule 0   No current facility-administered medications on file prior to visit.    There are no Patient Instructions on file for this visit. No follow-ups on file.   Kris Hartmann, NP  This note was completed with Sales executive.  Any errors are purely unintentional.

## 2019-12-11 ENCOUNTER — Other Ambulatory Visit: Payer: Self-pay | Admitting: *Deleted

## 2019-12-11 DIAGNOSIS — C61 Malignant neoplasm of prostate: Secondary | ICD-10-CM

## 2019-12-12 ENCOUNTER — Other Ambulatory Visit: Payer: Medicare Other

## 2019-12-12 ENCOUNTER — Other Ambulatory Visit: Payer: Self-pay

## 2019-12-12 DIAGNOSIS — C61 Malignant neoplasm of prostate: Secondary | ICD-10-CM

## 2019-12-13 ENCOUNTER — Ambulatory Visit (INDEPENDENT_AMBULATORY_CARE_PROVIDER_SITE_OTHER): Payer: Medicare Other | Admitting: Urology

## 2019-12-13 VITALS — BP 166/84 | HR 82 | Ht 67.0 in | Wt 155.0 lb

## 2019-12-13 DIAGNOSIS — C61 Malignant neoplasm of prostate: Secondary | ICD-10-CM

## 2019-12-13 LAB — PSA: Prostate Specific Ag, Serum: 1.1 ng/mL (ref 0.0–4.0)

## 2019-12-13 MED ORDER — SILDENAFIL CITRATE 20 MG PO TABS: 20.0000 mg | ORAL_TABLET | ORAL | 11 refills | Status: DC | PRN

## 2019-12-13 NOTE — Progress Notes (Signed)
12/13/19 9:04 AM   Allen Massey 07-21-1951 [id number]  Referring provider: Center, [facility] [place],  [place] [address]  Chief Complaint  Patient presents with  . Prostate Cancer    HPI: Allen Massey is a 69 y.o. African American M with a history of prostate cancer returns today for a 6 week sf/u for postop s/p RALP + BPLND.  Prostate cancer history: Patient underwent routine annual labs on 06/01/2019 at which time his PSA was noted to be 6.2.  Rectal exam was benign.  Prostate biopsy on 07/27/2019 revealed a 32 g prostate without a significant median lobe.  This revealed 5 of 12 cores of prostate cancer, on the left.  This ranged from Gleason 3+3, 3+4 up to Gleason 4+4 involving up to 50% at the left lateral apex.  CT of abd/pelvis with contrast and bone scan during this time were negative for evidence of metastatic disease.   He had a XI robotic assisted laparoscopic radical prostatectomy on 10/30/19. His surgical pathology report from 11/01/19 indicated gleason 3+4 with less than 5% of a tertiary pattern -EPE focal -SV +margin - lymph nodes pT2pN0.   Intraoperatively, there is some difficulty in the posterior plane dissection and ultimately dissection was subcapsular posteriorly as indicated by pathology above.  Most recent PSA from 12/12/2019 was 1.1 down significantly from previous value of 8.1 on 07/05/19.   He reports of healing incisions and leakage. He states that his leakage has improved and has been engaging in [place] exercises. He goes through 1 diaper a day.   + ED, did not elaborate much today as his daughter was present with him.  PMH: Past Medical History:  Diagnosis Date  . Cancer ([place])    PROSTATE  . Diabetes mellitus without complication ([place])   . Hyperlipidemia   . Hypertension   . Stroke [facility] 1990'S   MINI STROKE    Surgical History: Past Surgical History:  Procedure Laterality Date  . Colon cancer removal   10/30/2019  . NO PAST SURGERIES    . PELVIC LYMPH NODE DISSECTION Bilateral 10/30/2019   Procedure: PELVIC LYMPH NODE DISSECTION;  Surgeon: Hollice Espy, MD;  Location: [facility] ORS;  Service: Urology;  Laterality: Bilateral;  . ROBOT ASSISTED LAPAROSCOPIC RADICAL PROSTATECTOMY N/A 10/30/2019   Procedure: XI ROBOTIC ASSISTED LAPAROSCOPIC RADICAL PROSTATECTOMY;  Surgeon: Hollice Espy, MD;  Location: [facility] ORS;  Service: Urology;  Laterality: N/A;  . UMBILICAL HERNIA REPAIR N/A 10/30/2019   Procedure: HERNIA REPAIR UMBILICAL ADULT;  Surgeon: Hollice Espy, MD;  Location: [facility] ORS;  Service: Urology;  Laterality: N/A;    Home Medications:  Allergies as of 12/13/2019   No Known Allergies     Medication List       Accurate as of December 13, 2019 11:59 PM. If you have any questions, ask your nurse or doctor.        aspirin EC 81 MG tablet Take 81 mg by mouth daily.   atorvastatin 40 MG tablet Commonly known as: LIPITOR Take 40 mg by mouth at bedtime.   brimonidine 0.2 % ophthalmic solution Commonly known as: ALPHAGAN Place 1 drop into both eyes 3 (three) times daily.   docusate sodium 100 MG capsule Commonly known as: COLACE Take 1 capsule (100 mg total) by mouth 2 (two) times daily.   dorzolamide-timolol 22.3-6.8 MG/ML ophthalmic solution Commonly known as: COSOPT Place 1 drop into both eyes 2 (two) times daily.   latanoprost 0.005 % ophthalmic solution Commonly known as:  XALATAN Place 1 drop into both eyes at bedtime.   losartan 25 MG tablet Commonly known as: COZAAR Take 25 mg by mouth every morning.   metFORMIN 1000 MG tablet Commonly known as: GLUCOPHAGE Take 1,000 mg by mouth 2 (two) times daily.   sildenafil 20 MG tablet Commonly known as: Revatio Take 1 tablet (20 mg total) by mouth as needed. Take 1-5 tabs as needed prior to intercourse       Allergies: No Known Allergies  Family History: Family History  Problem Relation Age of Onset  . Diabetes  Brother   . Kidney disease Brother     Social History:  reports that he has been smoking cigarettes. He has a 12.00 pack-year smoking history. He has never used smokeless tobacco. He reports current alcohol use. He reports that he does not use drugs.   Physical Exam: BP (!) 166/84   Pulse 82   Ht 5\' 7"  (1.702 m)   Wt 155 lb (70.3 kg)   BMI 24.28 kg/m   Constitutional:  Alert and oriented, No acute distress. HEENT: [place] AT, moist mucus membranes.  Trachea midline, no masses. Cardiovascular: No clubbing, cyanosis, or edema. Abdomen: Incisions well-healed.  No hernias. Respiratory: Normal respiratory effort, no increased work of breathing. Skin: No rashes, bruises or suspicious lesions. Neurologic: Grossly intact, no focal deficits, moving all 4 extremities. Psychiatric: Normal mood and affect.  Laboratory Data: SURGICAL PATHOLOGY  CASE: [id number]  PATIENT: Allen Massey  Surgical Pathology Report      Specimen Submitted:  A. Periprostatic fat pad  B. Prostate, posterior apical margin  C. Lymph nodes, right pelvic  D. Lymph nodes, left pelvic  E. Prostate   Clinical History: Prostate cancer, umbilical hernia.     DIAGNOSIS:  A. SOFT TISSUE, PERIPROSTATIC FAT PAD; EXCISION:  - BENIGN FIBROADIPOSE TISSUE.  - NEGATIVE FOR MALIGNANCY.   B. PROSTATE GLAND, POSTERIOR APICAL MARGIN; BIOPSY:  - BENIGN PROSTATIC TISSUE.  - NEGATIVE FOR MALIGNANCY.   C. LYMPH NODES, RIGHT PELVIC; EXCISION:  - ONE LYMPH NODE, NEGATIVE FOR MALIGNANCY (0/1).   D. LYMPH NODE, LEFT PELVIC; EXCISION:  - ONE LYMPH NODE, NEGATIVE FOR MALIGNANCY (0/1).   E. PROSTATE GLAND; PROSTATECTOMY:  - ACINAR ADENOCARCINOMA OF THE PROSTATE.  - GLEASON PATTERN 3+4 = 7 (GRADE GROUP 2).  - HIGH GRADE PROSTATIC INTRAEPITHELIAL NEOPLASIA.   Comment:  Orientation of the specimen was difficult, due to specimen disruption.  The case was discussed with Dr. Erlene Quan to confirm the correct  orientation.  Although the surgical margins are positive, extraprostatic  extension cannot be definitively called, resulting in an AJCC stage of  at least pT2.   CANCER CASE SUMMARY: PROSTATE GLAND  Procedure: Radical prostatectomy  Histologic Type: Acinar adenocarcinoma  Histologic Grade:  Grade Group and Gleason Score: Grade group 2 (Gleason score 3+4 = 7)            Percentage of Pattern 4 in Gleason Score 7 Cancer:  30%            Tertiary Pattern 5 (<5%) in Overall Gleason Score  7: Not applicable  Extraprostatic Extension (EPE): Not identified  Urinary Bladder Neck Invasion: Not identified  Seminal Vesicle Invasion: No seminal vesicle present  Lymphovascular Invasion: Not identified  Perineural Invasion: Present  Margins: Involved by invasive carcinoma  Nonlimited (greater than or equal to 3 mm)  Left anterior  Left posterior  Treatment Effect: No known presurgical therapy  Regional Lymph Nodes: Number of Lymph Nodes Involved: 0  Number of Lymph Nodes Examined: 2   Pathologic Stage Classification (pTNM, AJCC 8th Edition): At least pT2  pN0  TNM Descriptors:   GROSS DESCRIPTION:  A. Labeled: Periprostatic fat pad  Received: In formalin  Tissue fragment(s): Multiple  Size: 2.5 x 2.5 x 1.0 cm in aggregate  Description: Unoriented grossly unremarkable fibrofatty tissue fragments  Entirely submitted in 3 cassettes.   B. Labeled: Prostate apical margin  Received: In formalin  Tissue fragment(s): 1  Size: 0.5 x 0.4 x 0.3 cm  Description: Unoriented fibroconnective tissue fragment  Entirely submitted in 1 cassette.   C. Labeled: Right pelvic lymph nodes  Received: In formalin  Tissue fragment(s): 1  Size: 2.2 x 1.0 x 0.8 cm  Description: Single fatty soft lymph node surrounded by fibrofatty  tissue  Entirely submitted in 2 cassettes bisected lymph node.   D. Labeled: Left pelvic lymph node  Received: In formalin  Tissue fragment(s): 1  Size: 3.5 x 2.0 x  0.4 cm  Description: Irregular fibrofatty soft tissue fragment which possibly  contain 1 lymph node measuring 0.3 cm in greatest dimension.  Entirely submitted in 3 cassettes.   E. Labeled: Prostate  Received: In formalin  Type of procedure: Prostatectomy  Integrity: Partially distorted and disrupted  Weight of specimen: 17 grams  Size of specimen: 4.8 cm from side-to-side, 3.2 cm from anterior to  posterior, 2.0 cm from distal to proximal  Orientation / inking:  Left posterior quadrant-black  Right posterior quadrant-blue  Left anterior quadrant-green  Right anterior quadrant-yellow  Seminal vesicles: Not present  Vasa deferens: Not present  Tissue description: There is no bilateral seminal vesicles or vas  deferens. There is no prostatic capsule/adventitia at the anterior and  bilateral sides of prostatic gland. Serial sections demonstrate  fibrotic and multicystic changes. No grossly identifiable tumor seen.  The specimen is photographed   Block summary:  1-2-distal (apical) surgical margin (perpendicular sections)  3-4-proximal (bladder neck) surgical margin (perpendicular sections)  5-12-entirely submitted left side of prostate from apex to base  13-21-entirely submitted right side of prostate from apex to base   Final Diagnosis performed by Allena Napoleon, MD.  Electronically signed  11/01/2019 4:08:49PM  The electronic signature indicates that the named Attending Pathologist  has evaluated the specimen  Technical component performed at [facility], [address] [place],  [place] [address] Lab: [phone] Dir: Rush Farmer, MD, MMM  Professional component performed at [facility], [facility], [place], [place], [place] [address] Lab: [phone]  Dir: Dellia Nims. Reuel Derby, MD   I have reviewed surgical pathology report.  Case was also discussed with Dr. Ronnald Ramp.  Assessment & Plan:    1. Prostat cancer Surgical path reviewed and margin is  positive with negative lymph nodes  PSA is detectable at 1.1 Suspect residual benign prostate tissue remaining from surgery also may be malignant component as well in setting of subcapsular posterior dissection. He will require adjuvant radiation and prefer to hold until stress incontinence issue resolves -we will follow PSA closely for stability in the interim F/u with serial PSA   Return in 2 months for MD visit with PSA prior for timing of adjuvant radiation   2. Stress incontinence Cleared for full activities  Pt engaging in [place] exercises; recommended to continue  He goes through 1 diaper a day; great improvement  Gradual improvement anticipated   3. 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  [facility] [address] [place], [place] [address] [phone]  I, Lucas Mallow, am acting as a scribe for Dr. Hollice Espy,  I have reviewed the above documentation for accuracy and completeness, and I agree with the above.   Hollice Espy, MD

## 2019-12-25 ENCOUNTER — Ambulatory Visit: Payer: Medicare Other | Admitting: Podiatry

## 2020-01-19 ENCOUNTER — Encounter: Payer: Self-pay | Admitting: *Deleted

## 2020-02-13 ENCOUNTER — Other Ambulatory Visit: Payer: Self-pay

## 2020-02-13 DIAGNOSIS — C61 Malignant neoplasm of prostate: Secondary | ICD-10-CM

## 2020-02-14 ENCOUNTER — Other Ambulatory Visit: Payer: Self-pay

## 2020-02-14 ENCOUNTER — Other Ambulatory Visit: Payer: Medicare Other

## 2020-02-14 DIAGNOSIS — C61 Malignant neoplasm of prostate: Secondary | ICD-10-CM

## 2020-02-15 LAB — PSA: Prostate Specific Ag, Serum: 1.7 ng/mL (ref 0.0–4.0)

## 2020-02-20 NOTE — Progress Notes (Addendum)
02/21/20 10:05 AM   Allen Massey 1951-02-02 [id number]  Referring provider: Center, [facility] [place],  [place] [address] Chief Complaint  Patient presents with  . Prostate Cancer    HPI: Allen Massey is a 69 y.o. M with a history of prostate cancer returns today for a f/u s/p RALP + BPLND.  Prostate biopsy on 07/27/2019 revealed a 32 g prostate without a significant median lobe. This revealed 5 of 12 cores of prostate cancer, on the left. This ranged from Gleason 3+3, 3+4 up to Gleason 4+4 involving up to 50% at the left lateral apex.  CT of abd/pelvis with contrast and bone scan during this time were negative for evidence of metastatic disease.   He had a XI robotic assisted laparoscopic radical prostatectomy on 10/30/19. His surgical pathology report from 11/01/19 indicated gleason 3+4 with less than 5% of a tertiary pattern -EPE focal -SV +margin - lymph nodes pT2pN0.   Post op PSA from 12/12/2019 was 1.1 ; Most recent PSA 1.7 as of 02/14/20.   He reports of returning to his daily activities and incontinence only upon passing gas otherwise is dry.   He also reports of Viagara being ineffective for his ED.   PMH: Past Medical History:  Diagnosis Date  . Cancer ([place])    PROSTATE  . Diabetes mellitus without complication ([place])   . Hyperlipidemia   . Hypertension   . Stroke [facility]) 1990'S   MINI STROKE    Surgical History: Past Surgical History:  Procedure Laterality Date  . Colon cancer removal  10/30/2019  . NO PAST SURGERIES    . PELVIC LYMPH NODE DISSECTION Bilateral 10/30/2019   Procedure: PELVIC LYMPH NODE DISSECTION;  Surgeon: Hollice Espy, MD;  Location: [facility] ORS;  Service: Urology;  Laterality: Bilateral;  . ROBOT ASSISTED LAPAROSCOPIC RADICAL PROSTATECTOMY N/A 10/30/2019   Procedure: XI ROBOTIC ASSISTED LAPAROSCOPIC RADICAL PROSTATECTOMY;  Surgeon: Hollice Espy, MD;  Location: [facility] ORS;  Service: Urology;  Laterality: N/A;    . UMBILICAL HERNIA REPAIR N/A 10/30/2019   Procedure: HERNIA REPAIR UMBILICAL ADULT;  Surgeon: Hollice Espy, MD;  Location: [facility] ORS;  Service: Urology;  Laterality: N/A;    Home Medications:  Allergies as of 02/21/2020   No Known Allergies     Medication List       Accurate as of Feb 21, 2020 11:59 PM. If you have any questions, ask your nurse or doctor.        aspirin EC 81 MG tablet Take 81 mg by mouth daily.   atorvastatin 40 MG tablet Commonly known as: LIPITOR Take 40 mg by mouth at bedtime.   brimonidine 0.2 % ophthalmic solution Commonly known as: ALPHAGAN Place 1 drop into both eyes 3 (three) times daily.   docusate sodium 100 MG capsule Commonly known as: COLACE Take 1 capsule (100 mg total) by mouth 2 (two) times daily.   dorzolamide-timolol 22.3-6.8 MG/ML ophthalmic solution Commonly known as: COSOPT Place 1 drop into both eyes 2 (two) times daily.   latanoprost 0.005 % ophthalmic solution Commonly known as: XALATAN Place 1 drop into both eyes at bedtime.   losartan 25 MG tablet Commonly known as: COZAAR Take 25 mg by mouth every morning.   metFORMIN 1000 MG tablet Commonly known as: GLUCOPHAGE Take 1,000 mg by mouth 2 (two) times daily.   sildenafil 20 MG tablet Commonly known as: Revatio Take 1 tablet (20 mg total) by mouth as needed. Take 1-5 tabs as needed prior  to intercourse       Allergies: No Known Allergies  Family History: Family History  Problem Relation Age of Onset  . Diabetes Brother   . Kidney disease Brother     Social History:  reports that he has been smoking cigarettes. He has a 12.00 pack-year smoking history. He has never used smokeless tobacco. He reports current alcohol use. He reports that he does not use drugs.   Physical Exam: BP (!) 141/79   Pulse (!) 101   Ht 5\' 7"  (1.702 m)   Wt 155 lb (70.3 kg)   BMI 24.28 kg/m   Constitutional:  Alert and oriented, No acute distress.  Accompanied by daughter  today. HEENT: [place] AT, moist mucus membranes.  Trachea midline, no masses. Cardiovascular: No clubbing, cyanosis, or edema. Respiratory: Normal respiratory effort, no increased work of breathing. Skin: No rashes, bruises or suspicious lesions. Neurologic: Grossly intact, no focal deficits, moving all 4 extremities. Psychiatric: Normal mood and affect.  Laboratory Data:  Lab Results  Component Value Date   CREATININE 0.97 10/31/2019    Lab Results  Component Value Date   HGBA1C 7.8 (H) 10/30/2019   Assessment & Plan:    1. Prostate cancer PSA 1.7 as of 02/14/20  Prostatectomy was subtotal with some residual posterior capsular tissue as well as SV's as indicated in operative report. In light of rising PSA as well as positive margin, high suspicion for residual malignant tissue and as per previous conversations, continue to recommend adjuvant radiation. Given his continence has improved dramatically at this point, timing for adjuvant radiation is now appropriate.  This will be with curative intent. Discussed risks and benefits of ADT therapy x 6 months including hot flashes and low energy  Referral sent for radiation oncology w/ Dr. 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 Hollice Espy,  I have reviewed the above documentation for accuracy and completeness, and I agree with the above.  Case discussed  with Dr. Baruch Gouty via secure chat today.  Hollice Espy, MD

## 2020-02-21 ENCOUNTER — Ambulatory Visit (INDEPENDENT_AMBULATORY_CARE_PROVIDER_SITE_OTHER): Payer: Medicare Other | Admitting: Urology

## 2020-02-21 ENCOUNTER — Other Ambulatory Visit: Payer: Self-pay

## 2020-02-21 ENCOUNTER — Encounter: Payer: Self-pay | Admitting: Urology

## 2020-02-21 VITALS — BP 141/79 | HR 101 | Ht 67.0 in | Wt 155.0 lb

## 2020-02-21 DIAGNOSIS — C61 Malignant neoplasm of prostate: Secondary | ICD-10-CM | POA: Diagnosis not present

## 2020-02-21 DIAGNOSIS — I6523 Occlusion and stenosis of bilateral carotid arteries: Secondary | ICD-10-CM | POA: Diagnosis not present

## 2020-02-23 ENCOUNTER — Emergency Department
Admission: EM | Admit: 2020-02-23 | Discharge: 2020-02-23 | Disposition: A | Discharge status: Home / Self Care | Payer: Medicare Other | Attending: Emergency Medicine | Admitting: Emergency Medicine

## 2020-02-23 ENCOUNTER — Emergency Department: Payer: Medicare Other

## 2020-02-23 ENCOUNTER — Other Ambulatory Visit: Payer: Self-pay

## 2020-02-23 DIAGNOSIS — F17210 Nicotine dependence, cigarettes, uncomplicated: Secondary | ICD-10-CM | POA: Diagnosis not present

## 2020-02-23 DIAGNOSIS — N50811 Right testicular pain: Secondary | ICD-10-CM | POA: Diagnosis present

## 2020-02-23 DIAGNOSIS — E119 Type 2 diabetes mellitus without complications: Secondary | ICD-10-CM | POA: Insufficient documentation

## 2020-02-23 DIAGNOSIS — Z8546 Personal history of malignant neoplasm of prostate: Secondary | ICD-10-CM | POA: Insufficient documentation

## 2020-02-23 DIAGNOSIS — N451 Epididymitis: Secondary | ICD-10-CM | POA: Insufficient documentation

## 2020-02-23 DIAGNOSIS — Z8673 Personal history of transient ischemic attack (TIA), and cerebral infarction without residual deficits: Secondary | ICD-10-CM | POA: Insufficient documentation

## 2020-02-23 DIAGNOSIS — Z7982 Long term (current) use of aspirin: Secondary | ICD-10-CM | POA: Diagnosis not present

## 2020-02-23 DIAGNOSIS — Z7984 Long term (current) use of oral hypoglycemic drugs: Secondary | ICD-10-CM | POA: Insufficient documentation

## 2020-02-23 DIAGNOSIS — N5089 Other specified disorders of the male genital organs: Secondary | ICD-10-CM

## 2020-02-23 DIAGNOSIS — I10 Essential (primary) hypertension: Secondary | ICD-10-CM | POA: Insufficient documentation

## 2020-02-23 LAB — CBC
HCT: 45.7 % (ref 39.0–52.0)
Hemoglobin: 15.4 g/dL (ref 13.0–17.0)
MCH: 30.3 pg (ref 26.0–34.0)
MCHC: 33.7 g/dL (ref 30.0–36.0)
MCV: 90 fL (ref 80.0–100.0)
Platelets: 222 10*3/uL (ref 150–400)
RBC: 5.08 MIL/uL (ref 4.22–5.81)
RDW: 12.8 % (ref 11.5–15.5)
WBC: 8.3 10*3/uL (ref 4.0–10.5)
nRBC: 0 % (ref 0.0–0.2)

## 2020-02-23 LAB — URINALYSIS, COMPLETE (UACMP) WITH MICROSCOPIC
Bacteria, UA: NONE SEEN
Bilirubin Urine: NEGATIVE
Glucose, UA: NEGATIVE mg/dL
Ketones, ur: NEGATIVE mg/dL
Nitrite: NEGATIVE
Protein, ur: 100 mg/dL — AB
Specific Gravity, Urine: 1.02 (ref 1.005–1.030)
WBC, UA: >50 WBC/hpf — ABNORMAL HIGH (ref 0–5)
pH: 5 (ref 5.0–8.0)

## 2020-02-23 LAB — BASIC METABOLIC PANEL
Anion gap: 8 (ref 5–15)
BUN: 12 mg/dL (ref 8–23)
CO2: 27 mmol/L (ref 22–32)
Calcium: 9.3 mg/dL (ref 8.9–10.3)
Chloride: 107 mmol/L (ref 98–111)
Creatinine, Ser: 1.01 mg/dL (ref 0.61–1.24)
GFR calc Af Amer: >60 mL/min (ref >60)
GFR calc non Af Amer: >60 mL/min (ref >60)
Glucose, Bld: 116 mg/dL — ABNORMAL HIGH (ref 70–99)
Potassium: 4 mmol/L (ref 3.5–5.1)
Sodium: 142 mmol/L (ref 135–145)

## 2020-02-23 MED ORDER — CIPROFLOXACIN HCL 500 MG PO TABS
500.0000 mg | ORAL_TABLET | Freq: Two times a day (BID) | ORAL | 0 refills | Status: AC
Start: 2020-02-23 — End: 2020-03-04

## 2020-02-23 MED ORDER — NAPROXEN 500 MG PO TABS
500.0000 mg | ORAL_TABLET | Freq: Two times a day (BID) | ORAL | 2 refills | Status: DC
Start: 2020-02-23 — End: 2020-04-21

## 2020-02-23 NOTE — ED Provider Notes (Signed)
[facility] Emergency Department Provider Note   ____________________________________________    I have reviewed the triage vital signs and the nursing notes.   HISTORY  Chief Complaint Groin Swelling     HPI Allen Massey is a 69 y.o. male with a history of diabetes who presents with complaints of right scrotal discomfort and swelling over the last 2 days.  Patient denies penile discharge, he reports he is sexually active, he is not concerned about STDs.  He denies dysuria.  No fevers or chills.  No abdominal pain.  No nausea or vomiting.  Has never had this before.  No injury to the area.   Past Medical History:  Diagnosis Date  . Cancer ([place])    PROSTATE  . Diabetes mellitus without complication ([place])   . Hyperlipidemia   . Hypertension   . Stroke [facility]) 1990'S   MINI STROKE    Patient Active Problem List   Diagnosis Date Noted  . Prostate cancer ([place]) 10/30/2019  . Carotid stenosis 05/15/2019  . 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 Essential hypertension 05/15/2019  . Diabetes ([place]) 05/15/2019    Past Surgical History:  Procedure Laterality Date  . Colon cancer removal  10/30/2019  . NO PAST SURGERIES    . PELVIC LYMPH NODE DISSECTION Bilateral 10/30/2019   Procedure: PELVIC LYMPH NODE DISSECTION;  Surgeon: Hollice Espy, MD;  Location: [facility] ORS;  Service: Urology;  Laterality: Bilateral;  . ROBOT ASSISTED LAPAROSCOPIC RADICAL PROSTATECTOMY N/A 10/30/2019   Procedure: XI ROBOTIC ASSISTED LAPAROSCOPIC RADICAL PROSTATECTOMY;  Surgeon: Hollice Espy, MD;  Location: [facility] ORS;  Service: Urology;  Laterality: N/A;  . UMBILICAL HERNIA REPAIR N/A 10/30/2019   Procedure: HERNIA REPAIR UMBILICAL ADULT;  Surgeon: Hollice Espy, MD;  Location: [facility] ORS;  Service: Urology;  Laterality: N/A;    Prior to Admission medications   Medication Sig Start Date End Date Taking? Authorizing Provider   aspirin EC 81 MG tablet Take 81 mg by mouth daily.    [provider]  atorvastatin (LIPITOR) 40 MG tablet Take 40 mg by mouth at bedtime.  01/31/19   [provider]  brimonidine (ALPHAGAN) 0.2 % ophthalmic solution Place 1 drop into both eyes 3 (three) times daily.    [provider]  ciprofloxacin (CIPRO) 500 MG tablet Take 1 tablet (500 mg total) by mouth 2 (two) times daily for 10 days. 02/23/20 03/04/20  Lavonia Drafts, MD  docusate sodium (COLACE) 100 MG capsule Take 1 capsule (100 mg total) by mouth 2 (two) times daily. 10/31/19   Vaillancourt, Aldona Bar, PA-C  dorzolamide-timolol (COSOPT) 22.3-6.8 MG/ML ophthalmic solution Place 1 drop into both eyes 2 (two) times daily.  04/25/19   [provider]  latanoprost (XALATAN) 0.005 % ophthalmic solution Place 1 drop into both eyes at bedtime.  04/25/19   [provider]  losartan (COZAAR) 25 MG tablet Take 25 mg by mouth every morning.  01/31/19   [provider]  metFORMIN (GLUCOPHAGE) 1000 MG tablet Take 1,000 mg by mouth 2 (two) times daily.  04/12/19   [provider]  naproxen (NAPROSYN) 500 MG tablet Take 1 tablet (500 mg total) by mouth 2 (two) times daily with a meal. 02/23/20   Lavonia Drafts, MD  sildenafil (REVATIO) 20 MG tablet Take 1 tablet (20 mg total) by mouth as needed. Take 1-5 tabs as needed prior to intercourse 12/13/19   Hollice Espy, MD     Allergies Patient has  no known allergies.  Family History  Problem Relation Age of Onset  . Diabetes Brother   . Kidney disease Brother     Social History Social History   Tobacco Use  . Smoking status: Current Some Day Smoker    Packs/day: 0.25    Years: 48.00    Pack years: 12.00    Types: Cigarettes  . Smokeless tobacco: Never Used  Substance Use Topics  . Alcohol use: Yes    Comment: OCC  . Drug use: No    Review of Systems  Constitutional: No fever/chills Eyes: No visual changes.  ENT: No sore  throat. Cardiovascular: Denies chest pain. Respiratory: Denies shortness of breath. Gastrointestinal: No abdominal pain.  No nausea, no vomiting.   Genitourinary: As above Musculoskeletal: Negative for back pain. Skin: Negative for rash. Neurological: Negative for headaches or weakness   ____________________________________________   PHYSICAL EXAM:  VITAL SIGNS: ED Triage Vitals [02/23/20 0908]  Enc Vitals Group     BP (!) 157/95     Pulse Rate 96     Resp 16     Temp 98.5 F (36.9 C)     Temp Source Oral     SpO2 96 %     Weight 70 kg (154 lb 5.2 oz)     Height 1.702 m (5\' 7" )     Head Circumference      Peak Flow      Pain Score 7     Pain Loc      Pain Edu?      Excl. in [place]?    \ Constitutional: Alert and oriented. No acute distress. Pleasant and interactive  Mouth/Throat: Mucous membranes are moist.    Cardiovascular: Normal rate, regular rhythm.  Good peripheral circulation. Respiratory: Normal respiratory effort.  No retractions. Lungs CTAB. Gastrointestinal: Soft and nontender. No distention.  No CVA tenderness. Genitourinary: Mild swelling to the right scrotum, mildly tender especially along the epididymis, no penile discharge Musculoskeletal: Warm and well perfused Neurologic:  Normal speech and language. No gross focal neurologic deficits are appreciated.  Skin:  Skin is warm, dry and intact. No rash noted. Psychiatric: Mood and affect are normal. Speech and behavior are normal.  ____________________________________________   LABS (all labs ordered are listed, but only abnormal results are displayed)  Labs Reviewed  BASIC METABOLIC PANEL - Abnormal; Notable for the following components:      Result Value   Glucose, Bld 116 (*)    All other components within normal limits  URINALYSIS, COMPLETE (UACMP) WITH MICROSCOPIC - Abnormal; Notable for the following components:   Color, Urine YELLOW (*)    APPearance HAZY (*)    Hgb urine dipstick SMALL (*)     Protein, ur 100 (*)    Leukocytes,Ua LARGE (*)    WBC, UA >50 (*)    All other components within normal limits  URINE CULTURE  CBC   ____________________________________________  EKG   ____________________________________________  RADIOLOGY  Ultrasound scrotum most consistent with epididymitis ____________________________________________   PROCEDURES  Procedure(s) performed: No  Procedures   Critical Care performed: No ____________________________________________   INITIAL IMPRESSION / ASSESSMENT AND PLAN / ED COURSE  Pertinent labs & imaging results that were available during my care of the patient were reviewed by me and considered in my medical decision making (see chart for details).  Patient presents with right scrotal swelling as described above, urinalysis demonstrates leukocytes, presentation is suspicious for epididymitis.  Pending ultrasound  Ultrasound consistent with epididymitis, will  start the patient on ciprofloxacin and naproxen, outpatient follow-up as needed.    ____________________________________________   FINAL CLINICAL IMPRESSION(S) / ED DIAGNOSES  Final diagnoses:  Epididymitis        Note:  This document was prepared using Dragon voice recognition software and may include unintentional dictation errors.   Lavonia Drafts, MD 02/23/20 1600

## 2020-02-23 NOTE — ED Notes (Signed)
Pt reports right flank pain that started yesterday as well as right testicle swelling that started yesterday. Denies difficulty urinating.

## 2020-02-23 NOTE — ED Triage Notes (Signed)
Right testicle swelling that he noticed yesterday. Denies urinary sx. Right flank pain.

## 2020-02-23 NOTE — ED Notes (Signed)
No further orders for imaging at this time per Dr. Corky Downs.

## 2020-02-25 LAB — URINE CULTURE

## 2020-03-08 ENCOUNTER — Encounter: Payer: Self-pay | Admitting: Radiation Oncology

## 2020-03-08 ENCOUNTER — Other Ambulatory Visit: Payer: Self-pay

## 2020-03-11 ENCOUNTER — Encounter: Payer: Self-pay | Admitting: Radiation Oncology

## 2020-03-11 ENCOUNTER — Ambulatory Visit
Admission: RE | Admit: 2020-03-11 | Discharge: 2020-03-11 | Disposition: A | Discharge status: Home / Self Care | Payer: Medicare Other | Source: Ambulatory Visit | Attending: Radiation Oncology | Admitting: Radiation Oncology

## 2020-03-11 ENCOUNTER — Other Ambulatory Visit: Payer: Self-pay

## 2020-03-11 VITALS — BP 157/95 | HR 84 | Temp 96.5°F | Resp 16 | Wt 164.6 lb

## 2020-03-11 DIAGNOSIS — E785 Hyperlipidemia, unspecified: Secondary | ICD-10-CM | POA: Insufficient documentation

## 2020-03-11 DIAGNOSIS — M545 Low back pain: Secondary | ICD-10-CM | POA: Insufficient documentation

## 2020-03-11 DIAGNOSIS — F17210 Nicotine dependence, cigarettes, uncomplicated: Secondary | ICD-10-CM | POA: Insufficient documentation

## 2020-03-11 DIAGNOSIS — N393 Stress incontinence (female) (male): Secondary | ICD-10-CM | POA: Insufficient documentation

## 2020-03-11 DIAGNOSIS — I10 Essential (primary) hypertension: Secondary | ICD-10-CM | POA: Insufficient documentation

## 2020-03-11 DIAGNOSIS — E119 Type 2 diabetes mellitus without complications: Secondary | ICD-10-CM | POA: Diagnosis not present

## 2020-03-11 DIAGNOSIS — Z7982 Long term (current) use of aspirin: Secondary | ICD-10-CM | POA: Diagnosis not present

## 2020-03-11 DIAGNOSIS — Z79899 Other long term (current) drug therapy: Secondary | ICD-10-CM | POA: Insufficient documentation

## 2020-03-11 DIAGNOSIS — C61 Malignant neoplasm of prostate: Secondary | ICD-10-CM

## 2020-03-11 DIAGNOSIS — Z7984 Long term (current) use of oral hypoglycemic drugs: Secondary | ICD-10-CM | POA: Diagnosis not present

## 2020-03-11 DIAGNOSIS — Z8673 Personal history of transient ischemic attack (TIA), and cerebral infarction without residual deficits: Secondary | ICD-10-CM | POA: Diagnosis not present

## 2020-03-11 DIAGNOSIS — R9721 Rising PSA following treatment for malignant neoplasm of prostate: Secondary | ICD-10-CM | POA: Diagnosis not present

## 2020-03-11 NOTE — Consult Note (Signed)
NEW PATIENT EVALUATION  Name: Allen Massey  MRN: [id number]  Date:   03/11/2020     DOB: 14-Jul-1951   This 69 y.o. 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  REFERRING PHYSICIAN: [facility], [place]:  Chief Complaint  Patient presents with  . Prostate Cancer    Initial consultation    DIAGNOSIS: The encounter diagnosis was Prostate cancer ([place]).   PREVIOUS INVESTIGATIONS:  Pathology report reviewed Bone scan and CT scans reviewed Clinical notes reviewed   HPI: Patient is a 69 year old male who was diagnosed back in October 2020 when he presented with a PSA of 8.1.  He had a 32 g prostate underwent transrectal ultrasound-guided biopsy showing five of 12 cores positive for adenocarcinoma ranging from Gleason six to Gleason eight.  CT scan and bone scan confirmed no evidence of local advanced disease or metastatic disease.  He had a robotic assisted laparoscopic prostatectomy January 21 showing Gleason seven (3+4).  Pathology showed acinar adenocarcinoma the prostate Gleason seven again 3+4) with high-grade prostatic intraepithelial neoplasm.  Margins were positive focally in the left anterior left posterior region.  He had two lymph nodes examined which were negative for malignancy.  There was perineural invasion.  His postop PSA in March 2021 was 1.1 most recently last month was 1.7.  He did have some stress incontinence which persists although is markedly improved.  Does have some lower back pain since his surgery although his bone scan initially and CT scan was negative for any evidence of metastatic metastatic disease.  He is now referred to radiation collagen for consideration of treatment.  PLANNED TREATMENT REGIMEN: Salvage radiation therapy  PAST MEDICAL HISTORY:  has a past medical history  of Cancer ([place]), Diabetes mellitus without complication ([place]), Hyperlipidemia, Hypertension, and Stroke ([place]) (1990'S).    PAST SURGICAL HISTORY:  Past Surgical History:  Procedure Laterality Date  . Colon cancer removal  10/30/2019  . NO PAST SURGERIES    . PELVIC LYMPH NODE DISSECTION Bilateral 10/30/2019   Procedure: PELVIC LYMPH NODE DISSECTION;  Surgeon: Hollice Espy, MD;  Location: [facility] ORS;  Service: Urology;  Laterality: Bilateral;  . ROBOT ASSISTED LAPAROSCOPIC RADICAL PROSTATECTOMY N/A 10/30/2019   Procedure: XI ROBOTIC ASSISTED LAPAROSCOPIC RADICAL PROSTATECTOMY;  Surgeon: Hollice Espy, MD;  Location: [facility] ORS;  Service: Urology;  Laterality: N/A;  . UMBILICAL HERNIA REPAIR N/A 10/30/2019   Procedure: HERNIA REPAIR UMBILICAL ADULT;  Surgeon: Hollice Espy, MD;  Location: [facility] ORS;  Service: Urology;  Laterality: N/A;    FAMILY HISTORY: family history includes Diabetes in his brother; Kidney disease in his brother.  SOCIAL HISTORY:  reports that he has been smoking cigarettes. He has a 12.00 pack-year smoking history. He has never used smokeless tobacco. He reports current alcohol use. He reports that he does not use drugs.  ALLERGIES: Patient has no known allergies.  MEDICATIONS:  Current Outpatient Medications  Medication Sig Dispense Refill  . aspirin EC 81 MG tablet Take 81 mg by mouth daily.    Marland Kitchen atorvastatin (LIPITOR) 40 MG tablet Take 40 mg by mouth at bedtime.     . brimonidine (ALPHAGAN) 0.2 % ophthalmic solution Place 1 drop into both eyes 3 (three) times daily.    Marland Kitchen docusate sodium (COLACE) 100 MG capsule Take 1 capsule (100 mg total) by mouth 2 (two) times daily. 14 capsule 0  .  dorzolamide-timolol (COSOPT) 22.3-6.8 MG/ML ophthalmic solution Place 1 drop into both eyes 2 (two) times daily.     Marland Kitchen latanoprost (XALATAN) 0.005 % ophthalmic solution Place 1 drop into both eyes at bedtime.     Marland Kitchen losartan (COZAAR) 25 MG tablet Take 25 mg by mouth every morning.      . metFORMIN (GLUCOPHAGE) 1000 MG tablet Take 1,000 mg by mouth 2 (two) times daily.     . naproxen (NAPROSYN) 500 MG tablet Take 1 tablet (500 mg total) by mouth 2 (two) times daily with a meal. 20 tablet 2  . sildenafil (REVATIO) 20 MG tablet Take 1 tablet (20 mg total) by mouth as needed. Take 1-5 tabs as needed prior to intercourse 30 tablet 11   No current facility-administered medications for this encounter.    ECOG PERFORMANCE STATUS:  0 - Asymptomatic  REVIEW OF SYSTEMS: Patient denies any weight loss, fatigue, weakness, fever, chills or night sweats. Patient denies any loss of vision, blurred vision. Patient denies any ringing  of the ears or hearing loss. No irregular heartbeat. Patient denies heart murmur or history of fainting. Patient denies any chest pain or pain radiating to her upper extremities. Patient denies any shortness of breath, difficulty breathing at night, cough or hemoptysis. Patient denies any swelling in the lower legs. Patient denies any nausea vomiting, vomiting of blood, or coffee ground material in the vomitus. Patient denies any stomach pain. Patient states has had normal bowel movements no significant constipation or diarrhea. Patient denies any dysuria, hematuria or significant nocturia. Patient denies any problems walking, swelling in the joints or loss of balance. Patient denies any skin changes, loss of hair or loss of weight. Patient denies any excessive worrying or anxiety or significant depression. Patient denies any problems with insomnia. Patient denies excessive thirst, polyuria, polydipsia. Patient denies any swollen glands, patient denies easy bruising or easy bleeding. Patient denies any recent infections, allergies or URI. Patient "s visual fields have not changed significantly in recent time.   PHYSICAL EXAM: BP (!) 157/95 (BP Location: Right Arm, Patient Position: Sitting, Cuff Size: Large)   Pulse 84   Temp (!) 96.5 F (35.8 C) (Tympanic)   Resp  16   Wt 164 lb 9.6 oz (74.7 kg)   BMI 25.78 kg/m  Well-developed well-nourished patient in NAD. HEENT reveals PERLA, EOMI, discs not visualized.  Oral cavity is clear. No oral mucosal lesions are identified. Neck is clear without evidence of cervical or supraclavicular adenopathy. Lungs are clear to A&P. Cardiac examination is essentially unremarkable with regular rate and rhythm without murmur rub or thrill. Abdomen is benign with no organomegaly or masses noted. Motor sensory and DTR levels are equal and symmetric in the upper and lower extremities. Cranial nerves II through XII are grossly intact. Proprioception is intact. No peripheral adenopathy or edema is identified. No motor or sensory levels are noted. Crude visual fields are within normal range.  LABORATORY DATA: Pathology report reviewed    RADIOLOGY RESULTS: Bone scan and CT scans reviewed compatible with above-stated findings   IMPRESSION: Locally advanced adenocarcinoma the prostate Gleason seven (3+4) with focal positive margin and rising PSA after prostatectomy in 69 year old male  PLAN: At this time I have offered salvage radiation therapy would plan on delivering 7600 cGy to his prostatic bed.  Based on the [facility] nomogram do not see any indication to treat his pelvic lymph nodes based on negative lymph node sampling as well as low probability based on his  tumor parameters that there is lymph node involvement to a significant level of certainty.  Risks and benefits of treatment including increased lower urinary tract symptoms possible worsening of his incontinence diarrhea fatigue alteration of blood count skin reaction all were discussed in detail with the patient.  He seems to comprehend my treatment plan well.  I have asked him to return to start [place] in Dr. Cherrie Gauze office.  I have also personally set up and ordered CT simulation in about a week's time.  Patient comprehends my treatment plan well.  I would  like to take this opportunity to thank you for allowing me to participate in the care of your patient.Noreene Filbert, MD

## 2020-03-26 ENCOUNTER — Telehealth: Payer: Self-pay

## 2020-03-26 ENCOUNTER — Ambulatory Visit
Admission: RE | Admit: 2020-03-26 | Discharge: 2020-03-26 | Disposition: A | Discharge status: Home / Self Care | Payer: Medicare Other | Source: Ambulatory Visit | Attending: Radiation Oncology | Admitting: Radiation Oncology

## 2020-03-26 DIAGNOSIS — C61 Malignant neoplasm of prostate: Secondary | ICD-10-CM | POA: Insufficient documentation

## 2020-03-26 NOTE — Telephone Encounter (Signed)
-----   Message from Daiva Huge, RN sent at 03/11/2020 11:11 AM EDT ----- Regarding: Eligard Good morning,   Mr. Mcintyre will need to receive Eligard with Dr. Erlene Quan.  Thank you,   Wilhemena Durie

## 2020-03-26 NOTE — Telephone Encounter (Signed)
No PA required as pt has medicare/medicaid. Pt scheduled for 04/01/2020 @3 :30pm per his request.

## 2020-03-29 ENCOUNTER — Other Ambulatory Visit: Payer: Self-pay | Admitting: *Deleted

## 2020-03-29 DIAGNOSIS — C61 Malignant neoplasm of prostate: Secondary | ICD-10-CM

## 2020-04-01 ENCOUNTER — Other Ambulatory Visit: Payer: Self-pay

## 2020-04-01 ENCOUNTER — Ambulatory Visit: Payer: Medicare Other

## 2020-04-01 DIAGNOSIS — C61 Malignant neoplasm of prostate: Secondary | ICD-10-CM

## 2020-04-01 NOTE — Patient Instructions (Signed)
Reminder to continue on Vitamin D 800-1000iu and Calium 1000-1200mg  daily while on Androgen Deprivation Therapy.  PA approval dates:

## 2020-04-01 NOTE — Progress Notes (Signed)
Eligard SubQ Injection   Due to Prostate Cancer patient is present today for a Eligard Injection.  Medication: Eligard 6 month Dose: 45 mg  Location: right  Lot: [id number] Exp: 12/02/2021  Patient tolerated well, no complications were noted  Performed by: Kerman Passey, RMA  Per Dr. Erlene Quan patient is to continue therapy for 6 months . Patient's next follow up was scheduled for 10/07/2020. This appointment was scheduled using wheel and given to patient today along with reminder continue on Vitamin D 800-1000iu and Calium 1000-1200mg  daily while on Androgen Deprivation Therapy.

## 2020-04-02 DIAGNOSIS — C61 Malignant neoplasm of prostate: Secondary | ICD-10-CM | POA: Diagnosis not present

## 2020-04-03 ENCOUNTER — Ambulatory Visit: Admission: RE | Admit: 2020-04-03 | Payer: Medicare Other | Source: Ambulatory Visit

## 2020-04-04 ENCOUNTER — Ambulatory Visit
Admission: RE | Admit: 2020-04-04 | Discharge: 2020-04-04 | Disposition: A | Discharge status: Home / Self Care | Payer: Medicare Other | Source: Ambulatory Visit | Attending: Radiation Oncology | Admitting: Radiation Oncology

## 2020-04-04 DIAGNOSIS — C61 Malignant neoplasm of prostate: Secondary | ICD-10-CM | POA: Diagnosis not present

## 2020-04-05 ENCOUNTER — Ambulatory Visit
Admission: RE | Admit: 2020-04-05 | Discharge: 2020-04-05 | Disposition: A | Discharge status: Home / Self Care | Payer: Medicare Other | Source: Ambulatory Visit | Attending: Radiation Oncology | Admitting: Radiation Oncology

## 2020-04-05 DIAGNOSIS — C61 Malignant neoplasm of prostate: Secondary | ICD-10-CM | POA: Diagnosis not present

## 2020-04-09 ENCOUNTER — Ambulatory Visit
Admission: RE | Admit: 2020-04-09 | Discharge: 2020-04-09 | Disposition: A | Discharge status: Home / Self Care | Payer: Medicare Other | Source: Ambulatory Visit | Attending: Radiation Oncology | Admitting: Radiation Oncology

## 2020-04-09 DIAGNOSIS — C61 Malignant neoplasm of prostate: Secondary | ICD-10-CM | POA: Diagnosis not present

## 2020-04-10 ENCOUNTER — Ambulatory Visit
Admission: RE | Admit: 2020-04-10 | Discharge: 2020-04-10 | Disposition: A | Discharge status: Home / Self Care | Payer: Medicare Other | Source: Ambulatory Visit | Attending: Radiation Oncology | Admitting: Radiation Oncology

## 2020-04-10 DIAGNOSIS — C61 Malignant neoplasm of prostate: Secondary | ICD-10-CM | POA: Diagnosis not present

## 2020-04-11 ENCOUNTER — Ambulatory Visit
Admission: RE | Admit: 2020-04-11 | Discharge: 2020-04-11 | Disposition: A | Discharge status: Home / Self Care | Payer: Medicare Other | Source: Ambulatory Visit | Attending: Radiation Oncology | Admitting: Radiation Oncology

## 2020-04-11 DIAGNOSIS — C61 Malignant neoplasm of prostate: Secondary | ICD-10-CM | POA: Diagnosis not present

## 2020-04-12 ENCOUNTER — Ambulatory Visit
Admission: RE | Admit: 2020-04-12 | Discharge: 2020-04-12 | Disposition: A | Discharge status: Home / Self Care | Payer: Medicare Other | Source: Ambulatory Visit | Attending: Radiation Oncology | Admitting: Radiation Oncology

## 2020-04-12 DIAGNOSIS — C61 Malignant neoplasm of prostate: Secondary | ICD-10-CM | POA: Diagnosis not present

## 2020-04-15 ENCOUNTER — Ambulatory Visit
Admission: RE | Admit: 2020-04-15 | Discharge: 2020-04-15 | Disposition: A | Discharge status: Home / Self Care | Payer: Medicare Other | Source: Ambulatory Visit | Attending: Radiation Oncology | Admitting: Radiation Oncology

## 2020-04-15 DIAGNOSIS — C61 Malignant neoplasm of prostate: Secondary | ICD-10-CM | POA: Diagnosis not present

## 2020-04-16 ENCOUNTER — Ambulatory Visit
Admission: RE | Admit: 2020-04-16 | Discharge: 2020-04-16 | Disposition: A | Discharge status: Home / Self Care | Payer: Medicare Other | Source: Ambulatory Visit | Attending: Radiation Oncology | Admitting: Radiation Oncology

## 2020-04-16 DIAGNOSIS — C61 Malignant neoplasm of prostate: Secondary | ICD-10-CM | POA: Diagnosis not present

## 2020-04-17 ENCOUNTER — Ambulatory Visit
Admission: RE | Admit: 2020-04-17 | Discharge: 2020-04-17 | Disposition: A | Discharge status: Home / Self Care | Payer: Medicare Other | Source: Ambulatory Visit | Attending: Radiation Oncology | Admitting: Radiation Oncology

## 2020-04-17 DIAGNOSIS — C61 Malignant neoplasm of prostate: Secondary | ICD-10-CM | POA: Diagnosis not present

## 2020-04-18 ENCOUNTER — Inpatient Hospital Stay: Payer: Medicare Other | Attending: Radiation Oncology

## 2020-04-18 ENCOUNTER — Ambulatory Visit
Admission: RE | Admit: 2020-04-18 | Discharge: 2020-04-18 | Disposition: A | Discharge status: Home / Self Care | Payer: Medicare Other | Source: Ambulatory Visit | Attending: Radiation Oncology | Admitting: Radiation Oncology

## 2020-04-18 ENCOUNTER — Other Ambulatory Visit: Payer: Self-pay

## 2020-04-18 DIAGNOSIS — C61 Malignant neoplasm of prostate: Secondary | ICD-10-CM

## 2020-04-18 LAB — CBC
HCT: 43.5 % (ref 39.0–52.0)
Hemoglobin: 14.8 g/dL (ref 13.0–17.0)
MCH: 31.2 pg (ref 26.0–34.0)
MCHC: 34 g/dL (ref 30.0–36.0)
MCV: 91.8 fL (ref 80.0–100.0)
Platelets: 186 10*3/uL (ref 150–400)
RBC: 4.74 MIL/uL (ref 4.22–5.81)
RDW: 12.6 % (ref 11.5–15.5)
WBC: 5.2 10*3/uL (ref 4.0–10.5)
nRBC: 0 % (ref 0.0–0.2)

## 2020-04-19 ENCOUNTER — Ambulatory Visit
Admission: RE | Admit: 2020-04-19 | Discharge: 2020-04-19 | Disposition: A | Discharge status: Home / Self Care | Payer: Medicare Other | Source: Ambulatory Visit | Attending: Radiation Oncology | Admitting: Radiation Oncology

## 2020-04-19 DIAGNOSIS — C61 Malignant neoplasm of prostate: Secondary | ICD-10-CM | POA: Diagnosis not present

## 2020-04-21 ENCOUNTER — Encounter: Payer: Self-pay | Admitting: Emergency Medicine

## 2020-04-21 ENCOUNTER — Other Ambulatory Visit: Payer: Self-pay

## 2020-04-21 ENCOUNTER — Emergency Department: Payer: Medicare Other

## 2020-04-21 ENCOUNTER — Emergency Department
Admission: EM | Admit: 2020-04-21 | Discharge: 2020-04-21 | Disposition: A | Discharge status: Home / Self Care | Payer: Medicare Other | Attending: Emergency Medicine | Admitting: Emergency Medicine

## 2020-04-21 DIAGNOSIS — Z7982 Long term (current) use of aspirin: Secondary | ICD-10-CM | POA: Diagnosis not present

## 2020-04-21 DIAGNOSIS — E119 Type 2 diabetes mellitus without complications: Secondary | ICD-10-CM | POA: Diagnosis not present

## 2020-04-21 DIAGNOSIS — I10 Essential (primary) hypertension: Secondary | ICD-10-CM | POA: Diagnosis not present

## 2020-04-21 DIAGNOSIS — F17210 Nicotine dependence, cigarettes, uncomplicated: Secondary | ICD-10-CM | POA: Diagnosis not present

## 2020-04-21 DIAGNOSIS — N50811 Right testicular pain: Secondary | ICD-10-CM

## 2020-04-21 DIAGNOSIS — Z7984 Long term (current) use of oral hypoglycemic drugs: Secondary | ICD-10-CM | POA: Diagnosis not present

## 2020-04-21 DIAGNOSIS — N451 Epididymitis: Secondary | ICD-10-CM

## 2020-04-21 DIAGNOSIS — Z79899 Other long term (current) drug therapy: Secondary | ICD-10-CM | POA: Insufficient documentation

## 2020-04-21 DIAGNOSIS — N50812 Left testicular pain: Secondary | ICD-10-CM | POA: Diagnosis present

## 2020-04-21 LAB — COMPREHENSIVE METABOLIC PANEL
ALT: 16 U/L (ref 0–44)
AST: 18 U/L (ref 15–41)
Albumin: 3.8 g/dL (ref 3.5–5.0)
Alkaline Phosphatase: 43 U/L (ref 38–126)
Anion gap: 6 (ref 5–15)
BUN: 20 mg/dL (ref 8–23)
CO2: 29 mmol/L (ref 22–32)
Calcium: 9.1 mg/dL (ref 8.9–10.3)
Chloride: 106 mmol/L (ref 98–111)
Creatinine, Ser: 1.24 mg/dL (ref 0.61–1.24)
GFR calc Af Amer: >60 mL/min (ref >60)
GFR calc non Af Amer: 59 mL/min — ABNORMAL LOW (ref >60)
Glucose, Bld: 125 mg/dL — ABNORMAL HIGH (ref 70–99)
Potassium: 4.2 mmol/L (ref 3.5–5.1)
Sodium: 141 mmol/L (ref 135–145)
Total Bilirubin: 0.6 mg/dL (ref 0.3–1.2)
Total Protein: 7.2 g/dL (ref 6.5–8.1)

## 2020-04-21 LAB — CBC
HCT: 43.6 % (ref 39.0–52.0)
Hemoglobin: 14.4 g/dL (ref 13.0–17.0)
MCH: 30.7 pg (ref 26.0–34.0)
MCHC: 33 g/dL (ref 30.0–36.0)
MCV: 93 fL (ref 80.0–100.0)
Platelets: 162 10*3/uL (ref 150–400)
RBC: 4.69 MIL/uL (ref 4.22–5.81)
RDW: 12.4 % (ref 11.5–15.5)
WBC: 5.5 10*3/uL (ref 4.0–10.5)
nRBC: 0 % (ref 0.0–0.2)

## 2020-04-21 LAB — URINALYSIS, COMPLETE (UACMP) WITH MICROSCOPIC
Bacteria, UA: NONE SEEN
Bilirubin Urine: NEGATIVE
Glucose, UA: NEGATIVE mg/dL
Ketones, ur: NEGATIVE mg/dL
Leukocytes,Ua: NEGATIVE
Nitrite: NEGATIVE
Protein, ur: 100 mg/dL — AB
Specific Gravity, Urine: 1.013 (ref 1.005–1.030)
Squamous Epithelial / HPF: NONE SEEN (ref 0–5)
pH: 5 (ref 5.0–8.0)

## 2020-04-21 MED ORDER — LEVOFLOXACIN 500 MG PO TABS: 500.0000 mg | ORAL_TABLET | Freq: Every day | ORAL | 0 refills | Status: AC

## 2020-04-21 MED ORDER — LEVOFLOXACIN 500 MG PO TABS
500.0000 mg | ORAL_TABLET | Freq: Once | ORAL | Status: AC
  Administered 2020-04-21: 500 mg via ORAL
  Filled 2020-04-21: qty 1

## 2020-04-21 MED ORDER — LEVOFLOXACIN 500 MG PO TABS: 500.0000 mg | ORAL_TABLET | Freq: Every day | ORAL | 0 refills | Status: DC

## 2020-04-21 MED ORDER — NAPROXEN 500 MG PO TABS: 500.0000 mg | ORAL_TABLET | Freq: Once | ORAL | Status: DC

## 2020-04-21 MED ORDER — NAPROXEN 500 MG PO TABS: 500.0000 mg | ORAL_TABLET | Freq: Two times a day (BID) | ORAL | 2 refills | Status: DC

## 2020-04-21 MED ORDER — CIPROFLOXACIN HCL 500 MG PO TABS: 500.0000 mg | ORAL_TABLET | Freq: Two times a day (BID) | ORAL | 0 refills | Status: DC

## 2020-04-21 NOTE — ED Triage Notes (Signed)
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 Patient states that he has had similar pain and was put on antibiotics. Patient states that he has has pain with urination.

## 2020-04-21 NOTE — ED Notes (Signed)
Pt observed drinking a pop and eating chips in lobby

## 2020-04-21 NOTE — Discharge Instructions (Addendum)
Please follow up with your regular doctor, or follow up with Dr. Erlene Quan or one of her colleagues at the urology clinic.  Complete your full course of antibiotics.  Return to the Emergency Department if you develop new or worsening symptoms that concern you.  Please note that I accidentally sent a prescription for ciprofloxacin as well as levofloxacin to your pharmacy.  I called them and left a message for them to NOT fill the ciprofloxacin but DO fill the levofloxacin.  Make sure that you only buy the levofloxacin; you should not take both medications.

## 2020-04-21 NOTE — ED Provider Notes (Signed)
[facility] Emergency Department Provider Note  ____________________________________________   First MD Initiated Contact with Patient 04/21/20 0535     (approximate)  I have reviewed the triage vital signs and the nursing notes.   HISTORY  Chief Complaint Testicle Pain   Level 5 caveat: Patient's history is limited due to being a very vague historian.   HPI Allen Massey is a 69 y.o. male with history of prostate cancer and prior epididymitis who presents for evaluation of acute onset pain in his left testicle.  He has had some swelling.  The pain is worse when he moves around and when the scrotum/testicles hang down.  It feels similar to pain he had in the past.  He adamantly denies any pain when he urinates and is seen no blood in his urine.  He has sustained no trauma.  He cannot give me many details at all about his medical history and he is uncertain which doctors he goes to regularly including a urologist or oncologist.  He denies fever, chest pain, SOB, N/V, abdominal pain.        Past Medical History:  Diagnosis Date  . Cancer ([place])    PROSTATE  . Diabetes mellitus without complication ([place])   . Hyperlipidemia   . Hypertension   . Stroke [facility]) 1990'S   MINI STROKE    Patient Active Problem List   Diagnosis Date Noted  . Prostate cancer ([place]) 10/30/2019  . Carotid stenosis 05/15/2019  . 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 Essential hypertension 05/15/2019  . Diabetes ([place]) 05/15/2019    Past Surgical History:  Procedure Laterality Date  . Colon cancer removal  10/30/2019  . NO PAST SURGERIES    . PELVIC LYMPH NODE DISSECTION Bilateral 10/30/2019   Procedure: PELVIC LYMPH NODE DISSECTION;  Surgeon: Hollice Espy, MD;  Location: [facility] ORS;  Service: Urology;  Laterality: Bilateral;  . ROBOT ASSISTED LAPAROSCOPIC RADICAL PROSTATECTOMY N/A 10/30/2019   Procedure: XI ROBOTIC  ASSISTED LAPAROSCOPIC RADICAL PROSTATECTOMY;  Surgeon: Hollice Espy, MD;  Location: [facility] ORS;  Service: Urology;  Laterality: N/A;  . UMBILICAL HERNIA REPAIR N/A 10/30/2019   Procedure: HERNIA REPAIR UMBILICAL ADULT;  Surgeon: Hollice Espy, MD;  Location: [facility] ORS;  Service: Urology;  Laterality: N/A;    Prior to Admission medications   Medication Sig Start Date End Date Taking? Authorizing Provider  aspirin EC 81 MG tablet Take 81 mg by mouth daily.    [provider]  atorvastatin (LIPITOR) 40 MG tablet Take 40 mg by mouth at bedtime.  01/31/19   [provider]  brimonidine (ALPHAGAN) 0.2 % ophthalmic solution Place 1 drop into both eyes 3 (three) times daily.    [provider]  docusate sodium (COLACE) 100 MG capsule Take 1 capsule (100 mg total) by mouth 2 (two) times daily. 10/31/19   Vaillancourt, Aldona Bar, PA-C  dorzolamide-timolol (COSOPT) 22.3-6.8 MG/ML ophthalmic solution Place 1 drop into both eyes 2 (two) times daily.  04/25/19   [provider]  latanoprost (XALATAN) 0.005 % ophthalmic solution Place 1 drop into both eyes at bedtime.  04/25/19   [provider]  levofloxacin (LEVAQUIN) 500 MG tablet Take 1 tablet (500 mg total) by mouth daily for 10 days. 04/21/20 05/01/20  Hinda Kehr, MD  losartan (COZAAR) 25 MG tablet Take 25 mg by mouth every morning.  01/31/19   [provider]  metFORMIN (GLUCOPHAGE) 1000 MG tablet Take 1,000 mg by mouth 2 (two)  times daily.  04/12/19   [provider]  naproxen (NAPROSYN) 500 MG tablet Take 1 tablet (500 mg total) by mouth 2 (two) times daily with a meal. 04/21/20   Hinda Kehr, MD  sildenafil (REVATIO) 20 MG tablet Take 1 tablet (20 mg total) by mouth as needed. Take 1-5 tabs as needed prior to intercourse 12/13/19   Hollice Espy, MD    Allergies Patient has no known allergies.  Family History  Problem Relation Age of Onset  . Diabetes Brother   . Kidney disease Brother       Social History Social History   Tobacco Use  . Smoking status: Current Some Day Smoker    Packs/day: 0.25    Years: 48.00    Pack years: 12.00    Types: Cigarettes  . Smokeless tobacco: Never Used  Vaping Use  . Vaping Use: Never used  Substance Use Topics  . Alcohol use: Yes    Comment: OCC  . Drug use: No    Review of Systems Level 5 caveat: Patient's history is limited due to being a very vague historian.  Constitutional: No fever/chills Eyes: No visual changes. ENT: No sore throat. Cardiovascular: Denies chest pain. Respiratory: Denies shortness of breath. Gastrointestinal: No abdominal pain.  No nausea, no vomiting.  No diarrhea.  No constipation. Genitourinary: Pain and some swelling in left testicle.  negative for dysuria. Musculoskeletal: Negative for neck pain.  Negative for back pain. Integumentary: Negative for rash. Neurological: Negative for headaches, focal weakness or numbness.   ____________________________________________   PHYSICAL EXAM:  VITAL SIGNS: ED Triage Vitals  Enc Vitals Group     BP 04/21/20 0050 (!) 166/104     Pulse Rate 04/21/20 0050 82     Resp 04/21/20 0050 18     Temp 04/21/20 0050 98 F (36.7 C)     Temp Source 04/21/20 0050 Oral     SpO2 04/21/20 0050 97 %     Weight 04/21/20 0055 69.4 kg (153 lb)     Height 04/21/20 0055 1.702 m (5\' 7" )     Head Circumference --      Peak Flow --      Pain Score 04/21/20 0055 10     Pain Loc --      Pain Edu? --      Excl. in [place]? --     Constitutional: Alert and oriented.  Eyes: Conjunctivae are normal.  Head: Atraumatic. Nose: No congestion/rhinnorhea. Mouth/Throat: Patient is wearing a mask. Neck: No stridor.  No meningeal signs.   Cardiovascular: Normal rate, regular rhythm. Good peripheral circulation. Grossly normal heart sounds. Respiratory: Normal respiratory effort.  No retractions. Gastrointestinal: Soft and nontender. No distention.  Genitourinary: Uncircumcised  penis without any obvious acute abnormality, no discharge, no lesions nor tenderness.  Right testicle is normal in appearance and nontender.  Left testicle is tender throughout but particularly at the superior aspect.  Minimal swelling and no scrotal edema.  No scrotal skin changes, no erythema nor crepitus. Musculoskeletal: No lower extremity tenderness nor edema. No gross deformities of extremities. Neurologic:  Normal speech and language. No gross focal neurologic deficits are appreciated.  Skin:  Skin is warm, dry and intact.  ____________________________________________   LABS (all labs ordered are listed, but only abnormal results are displayed)  Labs Reviewed  COMPREHENSIVE METABOLIC PANEL - Abnormal; Notable for the following components:      Result Value   Glucose, Bld 125 (*)    GFR calc non Af  Amer 59 (*)    All other components within normal limits  URINALYSIS, COMPLETE (UACMP) WITH MICROSCOPIC - Abnormal; Notable for the following components:   Color, Urine YELLOW (*)    APPearance CLEAR (*)    Hgb urine dipstick SMALL (*)    Protein, ur 100 (*)    All other components within normal limits  URINE CULTURE  CBC   ____________________________________________  EKG  No indication for emergent EKG ____________________________________________  RADIOLOGY I, Hinda Kehr, personally viewed and evaluated these images (plain radiographs) as part of my medical decision making, as well as reviewing the written report by the radiologist.  ED MD interpretation: Probable right epididymitis  Official radiology report(s): US SCROTUM W/DOPPLER  Result Date: 04/21/2020 CLINICAL DATA:  69 year old male with history of prostate cancer presenting with testicular pain. EXAM: SCROTAL ULTRASOUND DOPPLER ULTRASOUND OF THE TESTICLES TECHNIQUE: Complete ultrasound examination of the testicles, epididymis, and other scrotal structures was performed. Color and spectral Doppler ultrasound were  also utilized to evaluate blood flow to the testicles. COMPARISON:  None. FINDINGS: Right testicle Measurements: 3.6 x 2.5 x 2.9 cm. No mass or microlithiasis visualized. Left testicle Measurements: 3.5 x 1.9 x 2.9 cm. No mass or microlithiasis visualized. Right epididymis: There is hyperemic appearance of the right epididymis and adjacent scrotal wall. Clinical correlation is recommended to evaluate for epididymitis. Left epididymis:  Normal in size and appearance. Hydrocele:  Minimal right hydrocele. Varicocele:  None visualized. Pulsed Doppler interrogation of both testes demonstrates normal low resistance arterial and venous waveforms bilaterally. IMPRESSION: 1. Unremarkable testicles. 2. Possible right epididymitis. Correlation with clinical exam and urinalysis recommended. Electronically Signed   By: Anner Crete M.D.   On: 04/21/2020 02:56    ____________________________________________   PROCEDURES   Procedure(s) performed (including Critical Care):  Procedures   ____________________________________________   INITIAL IMPRESSION / MDM / [place] / ED COURSE  As part of my medical decision making, I reviewed the following data within the [place] notes reviewed and incorporated, Labs reviewed , Old chart reviewed and Notes from prior ED visits   Differential diagnosis includes, but is not limited to, epididymitis, hydrocele, spermatocele, neoplasm, torsion.  The patient's ultrasound is inconsistent with his physical exam.  He is having pain on the left but possible epididymitis on the right based on the ultrasound which also showed no other concerning findings.  However given his history of epididymitis with similar symptoms, I will treat empirically with Levaquin 500 mg p.o. daily x10 days.  His urinalysis was unremarkable but I have sent his urine for culture.  I recommended he follow-up with Dr. Erlene Quan with urology since he cannot remember  any of his existing doctors.  Comprehensive metabolic panel and CBC are both within normal limits.  He says he has not been sexually active for at least 7 months so I do not feel he needs empiric antibiotic treatment for STDs.  He understands and agrees with the plan for close follow-up and I gave my usual and customary return precautions.           ____________________________________________  FINAL CLINICAL IMPRESSION(S) / ED DIAGNOSES  Final diagnoses:  Left epididymitis     MEDICATIONS GIVEN DURING THIS VISIT:  Medications  naproxen (NAPROSYN) tablet 500 mg (has no administration in time range)  levofloxacin (LEVAQUIN) tablet 500 mg (500 mg Oral Given 04/21/20 6256)     ED Discharge Orders         Ordered  ciprofloxacin (CIPRO) 500 MG tablet  2 times daily,   Status:  Discontinued     Reprint     04/21/20 0553    naproxen (NAPROSYN) 500 MG tablet  2 times daily with meals,   Status:  Discontinued     Reprint     04/21/20 0557    levofloxacin (LEVAQUIN) 500 MG tablet  Daily,   Status:  Discontinued     Reprint     04/21/20 0639    levofloxacin (LEVAQUIN) 500 MG tablet  Daily     Discontinue  Reprint     04/21/20 0652    naproxen (NAPROSYN) 500 MG tablet  2 times daily with meals     Discontinue  Reprint     04/21/20 1222          *Please note:  Allen Massey was evaluated in Emergency Department on 04/21/2020 for the symptoms described in the history of present illness. He was evaluated in the context of the global COVID-19 pandemic, which necessitated consideration that the patient might be at risk for infection with the SARS-CoV-2 virus that causes COVID-19. Institutional protocols and algorithms that pertain to the evaluation of patients at risk for COVID-19 are in a state of rapid change based on information released by regulatory bodies including the CDC and federal and state organizations. These policies and algorithms were followed during the patient's care in the  ED.  Some ED evaluations and interventions may be delayed as a result of limited staffing during and after the pandemic.*  Note:  This document was prepared using Dragon voice recognition software and may include unintentional dictation errors.   Hinda Kehr, MD 04/21/20 [id number]

## 2020-04-21 NOTE — ED Notes (Signed)
Patient given update on wait time. Patient verbalizes understanding.  

## 2020-04-22 ENCOUNTER — Ambulatory Visit
Admission: RE | Admit: 2020-04-22 | Discharge: 2020-04-22 | Disposition: A | Discharge status: Home / Self Care | Payer: Medicare Other | Source: Ambulatory Visit | Attending: Radiation Oncology | Admitting: Radiation Oncology

## 2020-04-22 DIAGNOSIS — C61 Malignant neoplasm of prostate: Secondary | ICD-10-CM | POA: Diagnosis not present

## 2020-04-22 LAB — URINE CULTURE
Culture: <10000 — AB
Special Requests: NORMAL

## 2020-04-23 ENCOUNTER — Ambulatory Visit
Admission: RE | Admit: 2020-04-23 | Discharge: 2020-04-23 | Disposition: A | Discharge status: Home / Self Care | Payer: Medicare Other | Source: Ambulatory Visit | Attending: Radiation Oncology | Admitting: Radiation Oncology

## 2020-04-23 DIAGNOSIS — C61 Malignant neoplasm of prostate: Secondary | ICD-10-CM | POA: Diagnosis not present

## 2020-04-24 ENCOUNTER — Ambulatory Visit
Admission: RE | Admit: 2020-04-24 | Discharge: 2020-04-24 | Disposition: A | Discharge status: Home / Self Care | Payer: Medicare Other | Source: Ambulatory Visit | Attending: Radiation Oncology | Admitting: Radiation Oncology

## 2020-04-24 DIAGNOSIS — C61 Malignant neoplasm of prostate: Secondary | ICD-10-CM | POA: Diagnosis not present

## 2020-04-25 ENCOUNTER — Ambulatory Visit
Admission: RE | Admit: 2020-04-25 | Discharge: 2020-04-25 | Disposition: A | Discharge status: Home / Self Care | Payer: Medicare Other | Source: Ambulatory Visit | Attending: Radiation Oncology | Admitting: Radiation Oncology

## 2020-04-25 DIAGNOSIS — C61 Malignant neoplasm of prostate: Secondary | ICD-10-CM | POA: Diagnosis not present

## 2020-04-26 ENCOUNTER — Ambulatory Visit
Admission: RE | Admit: 2020-04-26 | Discharge: 2020-04-26 | Disposition: A | Discharge status: Home / Self Care | Payer: Medicare Other | Source: Ambulatory Visit | Attending: Radiation Oncology | Admitting: Radiation Oncology

## 2020-04-26 DIAGNOSIS — C61 Malignant neoplasm of prostate: Secondary | ICD-10-CM | POA: Diagnosis not present

## 2020-04-29 ENCOUNTER — Ambulatory Visit
Admission: RE | Admit: 2020-04-29 | Discharge: 2020-04-29 | Disposition: A | Discharge status: Home / Self Care | Payer: Medicare Other | Source: Ambulatory Visit | Attending: Radiation Oncology | Admitting: Radiation Oncology

## 2020-04-29 DIAGNOSIS — C61 Malignant neoplasm of prostate: Secondary | ICD-10-CM | POA: Diagnosis not present

## 2020-04-30 ENCOUNTER — Ambulatory Visit
Admission: RE | Admit: 2020-04-30 | Discharge: 2020-04-30 | Disposition: A | Discharge status: Home / Self Care | Payer: Medicare Other | Source: Ambulatory Visit | Attending: Radiation Oncology | Admitting: Radiation Oncology

## 2020-04-30 DIAGNOSIS — C61 Malignant neoplasm of prostate: Secondary | ICD-10-CM | POA: Diagnosis not present

## 2020-05-01 ENCOUNTER — Ambulatory Visit
Admission: RE | Admit: 2020-05-01 | Discharge: 2020-05-01 | Disposition: A | Discharge status: Home / Self Care | Payer: Medicare Other | Source: Ambulatory Visit | Attending: Radiation Oncology | Admitting: Radiation Oncology

## 2020-05-01 DIAGNOSIS — C61 Malignant neoplasm of prostate: Secondary | ICD-10-CM | POA: Diagnosis not present

## 2020-05-02 ENCOUNTER — Ambulatory Visit
Admission: RE | Admit: 2020-05-02 | Discharge: 2020-05-02 | Disposition: A | Discharge status: Home / Self Care | Payer: Medicare Other | Source: Ambulatory Visit | Attending: Radiation Oncology | Admitting: Radiation Oncology

## 2020-05-02 ENCOUNTER — Inpatient Hospital Stay: Payer: Medicare Other

## 2020-05-02 ENCOUNTER — Other Ambulatory Visit: Payer: Self-pay

## 2020-05-02 DIAGNOSIS — C61 Malignant neoplasm of prostate: Secondary | ICD-10-CM

## 2020-05-02 LAB — CBC
HCT: 42.3 % (ref 39.0–52.0)
Hemoglobin: 14.5 g/dL (ref 13.0–17.0)
MCH: 30.9 pg (ref 26.0–34.0)
MCHC: 34.3 g/dL (ref 30.0–36.0)
MCV: 90 fL (ref 80.0–100.0)
Platelets: 189 10*3/uL (ref 150–400)
RBC: 4.7 MIL/uL (ref 4.22–5.81)
RDW: 12.1 % (ref 11.5–15.5)
WBC: 6.2 10*3/uL (ref 4.0–10.5)
nRBC: 0 % (ref 0.0–0.2)

## 2020-05-03 ENCOUNTER — Ambulatory Visit
Admission: RE | Admit: 2020-05-03 | Discharge: 2020-05-03 | Disposition: A | Discharge status: Home / Self Care | Payer: Medicare Other | Source: Ambulatory Visit | Attending: Radiation Oncology | Admitting: Radiation Oncology

## 2020-05-03 DIAGNOSIS — C61 Malignant neoplasm of prostate: Secondary | ICD-10-CM | POA: Diagnosis not present

## 2020-05-06 ENCOUNTER — Ambulatory Visit
Admission: RE | Admit: 2020-05-06 | Discharge: 2020-05-06 | Disposition: A | Discharge status: Home / Self Care | Payer: Medicare Other | Source: Ambulatory Visit | Attending: Radiation Oncology | Admitting: Radiation Oncology

## 2020-05-06 DIAGNOSIS — C61 Malignant neoplasm of prostate: Secondary | ICD-10-CM | POA: Diagnosis present

## 2020-05-07 ENCOUNTER — Ambulatory Visit
Admission: RE | Admit: 2020-05-07 | Discharge: 2020-05-07 | Disposition: A | Discharge status: Home / Self Care | Payer: Medicare Other | Source: Ambulatory Visit | Attending: Radiation Oncology | Admitting: Radiation Oncology

## 2020-05-07 DIAGNOSIS — C61 Malignant neoplasm of prostate: Secondary | ICD-10-CM | POA: Diagnosis not present

## 2020-05-08 ENCOUNTER — Ambulatory Visit
Admission: RE | Admit: 2020-05-08 | Discharge: 2020-05-08 | Disposition: A | Discharge status: Home / Self Care | Payer: Medicare Other | Source: Ambulatory Visit | Attending: Radiation Oncology | Admitting: Radiation Oncology

## 2020-05-08 DIAGNOSIS — C61 Malignant neoplasm of prostate: Secondary | ICD-10-CM | POA: Diagnosis not present

## 2020-05-09 ENCOUNTER — Ambulatory Visit
Admission: RE | Admit: 2020-05-09 | Discharge: 2020-05-09 | Disposition: A | Discharge status: Home / Self Care | Payer: Medicare Other | Source: Ambulatory Visit | Attending: Radiation Oncology | Admitting: Radiation Oncology

## 2020-05-09 DIAGNOSIS — C61 Malignant neoplasm of prostate: Secondary | ICD-10-CM | POA: Diagnosis not present

## 2020-05-10 ENCOUNTER — Ambulatory Visit
Admission: RE | Admit: 2020-05-10 | Discharge: 2020-05-10 | Disposition: A | Discharge status: Home / Self Care | Payer: Medicare Other | Source: Ambulatory Visit | Attending: Radiation Oncology | Admitting: Radiation Oncology

## 2020-05-10 DIAGNOSIS — C61 Malignant neoplasm of prostate: Secondary | ICD-10-CM | POA: Diagnosis not present

## 2020-05-13 ENCOUNTER — Ambulatory Visit
Admission: RE | Admit: 2020-05-13 | Discharge: 2020-05-13 | Disposition: A | Discharge status: Home / Self Care | Payer: Medicare Other | Source: Ambulatory Visit | Attending: Radiation Oncology | Admitting: Radiation Oncology

## 2020-05-13 DIAGNOSIS — C61 Malignant neoplasm of prostate: Secondary | ICD-10-CM | POA: Diagnosis not present

## 2020-05-14 ENCOUNTER — Ambulatory Visit
Admission: RE | Admit: 2020-05-14 | Discharge: 2020-05-14 | Disposition: A | Discharge status: Home / Self Care | Payer: Medicare Other | Source: Ambulatory Visit | Attending: Radiation Oncology | Admitting: Radiation Oncology

## 2020-05-14 DIAGNOSIS — C61 Malignant neoplasm of prostate: Secondary | ICD-10-CM | POA: Diagnosis not present

## 2020-05-15 ENCOUNTER — Ambulatory Visit
Admission: RE | Admit: 2020-05-15 | Discharge: 2020-05-15 | Disposition: A | Discharge status: Home / Self Care | Payer: Medicare Other | Source: Ambulatory Visit | Attending: Radiation Oncology | Admitting: Radiation Oncology

## 2020-05-15 DIAGNOSIS — C61 Malignant neoplasm of prostate: Secondary | ICD-10-CM | POA: Diagnosis not present

## 2020-05-16 ENCOUNTER — Inpatient Hospital Stay: Payer: Medicare Other | Attending: Radiation Oncology

## 2020-05-16 ENCOUNTER — Ambulatory Visit
Admission: RE | Admit: 2020-05-16 | Discharge: 2020-05-16 | Disposition: A | Discharge status: Home / Self Care | Payer: Medicare Other | Source: Ambulatory Visit | Attending: Radiation Oncology | Admitting: Radiation Oncology

## 2020-05-16 DIAGNOSIS — C61 Malignant neoplasm of prostate: Secondary | ICD-10-CM | POA: Diagnosis not present

## 2020-05-17 ENCOUNTER — Ambulatory Visit
Admission: RE | Admit: 2020-05-17 | Discharge: 2020-05-17 | Disposition: A | Discharge status: Home / Self Care | Payer: Medicare Other | Source: Ambulatory Visit | Attending: Radiation Oncology | Admitting: Radiation Oncology

## 2020-05-17 DIAGNOSIS — C61 Malignant neoplasm of prostate: Secondary | ICD-10-CM | POA: Diagnosis not present

## 2020-05-20 ENCOUNTER — Ambulatory Visit
Admission: RE | Admit: 2020-05-20 | Discharge: 2020-05-20 | Disposition: A | Discharge status: Home / Self Care | Payer: Medicare Other | Source: Ambulatory Visit | Attending: Radiation Oncology | Admitting: Radiation Oncology

## 2020-05-20 DIAGNOSIS — C61 Malignant neoplasm of prostate: Secondary | ICD-10-CM | POA: Diagnosis not present

## 2020-05-21 ENCOUNTER — Ambulatory Visit
Admission: RE | Admit: 2020-05-21 | Discharge: 2020-05-21 | Disposition: A | Discharge status: Home / Self Care | Payer: Medicare Other | Source: Ambulatory Visit | Attending: Radiation Oncology | Admitting: Radiation Oncology

## 2020-05-21 DIAGNOSIS — C61 Malignant neoplasm of prostate: Secondary | ICD-10-CM | POA: Diagnosis not present

## 2020-05-22 ENCOUNTER — Ambulatory Visit
Admission: RE | Admit: 2020-05-22 | Discharge: 2020-05-22 | Disposition: A | Discharge status: Home / Self Care | Payer: Medicare Other | Source: Ambulatory Visit | Attending: Radiation Oncology | Admitting: Radiation Oncology

## 2020-05-22 DIAGNOSIS — C61 Malignant neoplasm of prostate: Secondary | ICD-10-CM | POA: Diagnosis not present

## 2020-05-23 ENCOUNTER — Ambulatory Visit
Admission: RE | Admit: 2020-05-23 | Discharge: 2020-05-23 | Disposition: A | Discharge status: Home / Self Care | Payer: Medicare Other | Source: Ambulatory Visit | Attending: Radiation Oncology | Admitting: Radiation Oncology

## 2020-05-23 DIAGNOSIS — C61 Malignant neoplasm of prostate: Secondary | ICD-10-CM | POA: Diagnosis not present

## 2020-05-24 ENCOUNTER — Ambulatory Visit
Admission: RE | Admit: 2020-05-24 | Discharge: 2020-05-24 | Disposition: A | Discharge status: Home / Self Care | Payer: Medicare Other | Source: Ambulatory Visit | Attending: Radiation Oncology | Admitting: Radiation Oncology

## 2020-05-24 DIAGNOSIS — C61 Malignant neoplasm of prostate: Secondary | ICD-10-CM | POA: Diagnosis not present

## 2020-05-27 ENCOUNTER — Ambulatory Visit
Admission: RE | Admit: 2020-05-27 | Discharge: 2020-05-27 | Disposition: A | Discharge status: Home / Self Care | Payer: Medicare Other | Source: Ambulatory Visit | Attending: Radiation Oncology | Admitting: Radiation Oncology

## 2020-05-27 DIAGNOSIS — C61 Malignant neoplasm of prostate: Secondary | ICD-10-CM | POA: Diagnosis not present

## 2020-05-28 ENCOUNTER — Ambulatory Visit
Admission: RE | Admit: 2020-05-28 | Discharge: 2020-05-28 | Disposition: A | Discharge status: Home / Self Care | Payer: Medicare Other | Source: Ambulatory Visit | Attending: Radiation Oncology | Admitting: Radiation Oncology

## 2020-05-28 DIAGNOSIS — C61 Malignant neoplasm of prostate: Secondary | ICD-10-CM | POA: Diagnosis not present

## 2020-06-12 ENCOUNTER — Other Ambulatory Visit (INDEPENDENT_AMBULATORY_CARE_PROVIDER_SITE_OTHER): Payer: Self-pay | Admitting: Nurse Practitioner

## 2020-06-12 DIAGNOSIS — I739 Peripheral vascular disease, unspecified: Secondary | ICD-10-CM

## 2020-06-12 NOTE — Progress Notes (Deleted)
MRN : [id number]  Allen Massey is a 69 y.o. (Jun 22, 1951) male who presents with chief complaint of No chief complaint on file. Marland Kitchen  History of Present Illness:   The patient returns to the office for followup and review of the noninvasive studies. There have been no interval changes in lower extremity symptoms.  The patient does endorse having some claudication-like symptoms however they are tolerable for him at this time.  No new ulcers or wounds have occurred since the last visit.  There have been no significant changes to the patient's overall health care.  The patient denies amaurosis fugax or recent TIA symptoms. There are no recent neurological changes noted. The patient denies history of DVT, PE or superficial thrombophlebitis. The patient denies recent episodes of angina or shortness of breath.   ABI Rt=0.76 and Lt=0.91  (previous ABI's Rt=0.73 and Lt=0.65) Duplex ultrasound of the bilateral lower extremities reveal monophasic tibial artery waveforms.  The left digit waveforms are slightly dampened where the right digit is more severely dampened.   The patient is seen for follow up evaluation of carotid stenosis. The carotid stenosis followed by ultrasound.   There is a history of hyperlipidemia which is being treated with a statin.   Carotid Duplex done today shows 1 to 39% stenosis of the right internal carotid artery with a total occlusion of the left ICA.  No change compared to last study in 06/07/2019.  No outpatient medications have been marked as taking for the 06/13/20 encounter (Appointment) with Delana Meyer, Dolores Lory, MD.    Past Medical History:  Diagnosis Date  . 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 Diabetes mellitus without complication ([place])   . Hyperlipidemia   . Hypertension   . Stroke [facility]) 1990'S   MINI STROKE    Past Surgical History:  Procedure Laterality Date  . Colon cancer removal  10/30/2019  . NO PAST SURGERIES    . PELVIC LYMPH NODE DISSECTION  Bilateral 10/30/2019   Procedure: PELVIC LYMPH NODE DISSECTION;  Surgeon: Hollice Espy, MD;  Location: [facility] ORS;  Service: Urology;  Laterality: Bilateral;  . ROBOT ASSISTED LAPAROSCOPIC RADICAL PROSTATECTOMY N/A 10/30/2019   Procedure: XI ROBOTIC ASSISTED LAPAROSCOPIC RADICAL PROSTATECTOMY;  Surgeon: Hollice Espy, MD;  Location: [facility] ORS;  Service: Urology;  Laterality: N/A;  . UMBILICAL HERNIA REPAIR N/A 10/30/2019   Procedure: HERNIA REPAIR UMBILICAL ADULT;  Surgeon: Hollice Espy, MD;  Location: [facility] ORS;  Service: Urology;  Laterality: N/A;    Social History Social History   Tobacco Use  . Smoking status: Current Some Day Smoker    Packs/day: 0.25    Years: 48.00    Pack years: 12.00    Types: Cigarettes  . Smokeless tobacco: Never Used  Vaping Use  . Vaping Use: Never used  Substance Use Topics  . Alcohol use: Yes    Comment: OCC  . Drug use: No    Family History Family History  Problem Relation Age of Onset  . Diabetes Brother   . Kidney disease Brother     No Known Allergies   REVIEW OF SYSTEMS (Negative unless checked)  Constitutional: [] Weight loss  [] Fever  [] Chills Cardiac: [] Chest pain   [] Chest pressure   [] Palpitations   [] Shortness of breath when laying flat   [] Shortness of breath with exertion. Vascular:  [x] Pain in legs with walking   [] Pain in legs at rest  [] History of DVT   [] Phlebitis   [] Swelling in legs   [] Varicose veins   []   Non-healing ulcers Pulmonary:   [] Uses home oxygen   [] Productive cough   [] Hemoptysis   [] Wheeze  [] COPD   [] Asthma Neurologic:  [] Dizziness   [] Seizures   [] History of stroke   [] History of TIA  [] Aphasia   [] Vissual changes   [] Weakness or numbness in arm   [] Weakness or numbness in leg Musculoskeletal:   [] Joint swelling   [] Joint pain   [] Low back pain Hematologic:  [] Easy bruising  [] Easy bleeding   [] Hypercoagulable state   [] Anemic Gastrointestinal:  [] Diarrhea   [] Vomiting  [] Gastroesophageal reflux/heartburn    [] Difficulty swallowing. Genitourinary:  [] Chronic kidney disease   [] Difficult urination  [] Frequent urination   [] Blood in urine Skin:  [] Rashes   [] Ulcers  Psychological:  [] History of anxiety   []  History of major depression.  Physical Examination  There were no vitals filed for this visit. There is no height or weight on file to calculate BMI. Gen: WD/WN, NAD Head: [place]/AT, No temporalis wasting.  Ear/Nose/Throat: Hearing grossly intact, nares w/o erythema or drainage Eyes: PER, EOMI, sclera nonicteric.  Neck: Supple, no large masses.   Pulmonary:  Good air movement, no audible wheezing bilaterally, no use of accessory muscles.  Cardiac: RRR, no JVD Vascular: *** Vessel Right Left  Radial Palpable Palpable  Ulnar Palpable Palpable  Brachial Palpable Palpable  Carotid Palpable Palpable  Femoral Palpable Palpable  Popliteal Palpable Palpable  PT Palpable Palpable  DP Palpable Palpable  Gastrointestinal: Non-distended. No guarding/no peritoneal signs.  Musculoskeletal: M/S 5/5 throughout.  No deformity or atrophy.  Neurologic: CN 2-12 intact. Symmetrical.  Speech is fluent. Motor exam as listed above. Psychiatric: Judgment intact, Mood & affect appropriate for pt's clinical situation. Dermatologic: No rashes or ulcers noted.  No changes consistent with cellulitis. Lymph : No lichenification or skin changes of chronic lymphedema.  CBC Lab Results  Component Value Date   WBC 6.2 05/02/2020   HGB 14.5 05/02/2020   HCT 42.3 05/02/2020   MCV 90.0 05/02/2020   PLT 189 05/02/2020    BMET    Component Value Date/Time   NA 141 04/21/2020 0059   NA 139 05/11/2014 0507   K 4.2 04/21/2020 0059   K 3.5 05/11/2014 0507   CL 106 04/21/2020 0059   CL 106 05/11/2014 0507   CO2 29 04/21/2020 0059   CO2 26 05/11/2014 0507   GLUCOSE 125 (H) 04/21/2020 0059   GLUCOSE 137 (H) 05/11/2014 0507   BUN 20 04/21/2020 0059   BUN 10 05/11/2014 0507   CREATININE 1.24 04/21/2020 0059    CREATININE 1.07 05/11/2014 0507   CALCIUM 9.1 04/21/2020 0059   CALCIUM 8.6 05/11/2014 0507   GFRNONAA 59 (L) 04/21/2020 0059   GFRNONAA >60 05/11/2014 0507   GFRAA >60 04/21/2020 0059   GFRAA >60 05/11/2014 0507   CrCl cannot be calculated (Patient's most recent lab result is older than the maximum 21 days allowed.).  COAG Lab Results  Component Value Date   INR 0.9 10/26/2019    Radiology No results found.   Assessment/Plan There are no diagnoses linked to this encounter.   Hortencia Pilar, MD  06/12/2020 6:32 PM

## 2020-06-13 ENCOUNTER — Ambulatory Visit (INDEPENDENT_AMBULATORY_CARE_PROVIDER_SITE_OTHER): Payer: Medicare Other | Admitting: Vascular Surgery

## 2020-06-13 ENCOUNTER — Encounter (INDEPENDENT_AMBULATORY_CARE_PROVIDER_SITE_OTHER): Payer: Medicare Other

## 2020-06-28 ENCOUNTER — Ambulatory Visit (INDEPENDENT_AMBULATORY_CARE_PROVIDER_SITE_OTHER): Payer: Medicare Other | Admitting: Nurse Practitioner

## 2020-06-28 ENCOUNTER — Encounter (INDEPENDENT_AMBULATORY_CARE_PROVIDER_SITE_OTHER): Payer: Self-pay | Admitting: Nurse Practitioner

## 2020-06-28 ENCOUNTER — Ambulatory Visit (INDEPENDENT_AMBULATORY_CARE_PROVIDER_SITE_OTHER): Payer: Medicare Other

## 2020-06-28 ENCOUNTER — Other Ambulatory Visit: Payer: Self-pay

## 2020-06-28 VITALS — BP 129/78 | HR 94 | Resp 16 | Wt 165.0 lb

## 2020-06-28 DIAGNOSIS — I739 Peripheral vascular disease, unspecified: Secondary | ICD-10-CM

## 2020-06-28 DIAGNOSIS — I70219 Atherosclerosis of native arteries of extremities with intermittent claudication, unspecified extremity: Secondary | ICD-10-CM

## 2020-06-28 DIAGNOSIS — I6523 Occlusion and stenosis of bilateral carotid arteries: Secondary | ICD-10-CM

## 2020-06-28 DIAGNOSIS — E1151 Type 2 diabetes mellitus with diabetic peripheral angiopathy without gangrene: Secondary | ICD-10-CM | POA: Diagnosis not present

## 2020-06-28 DIAGNOSIS — I10 Essential (primary) hypertension: Secondary | ICD-10-CM | POA: Diagnosis not present

## 2020-07-02 ENCOUNTER — Encounter (INDEPENDENT_AMBULATORY_CARE_PROVIDER_SITE_OTHER): Payer: Self-pay | Admitting: Nurse Practitioner

## 2020-07-02 NOTE — Progress Notes (Signed)
Subjective:    Patient ID: Allen Massey, male    DOB: 06-03-1951, 69 y.o.   MRN: [id number] Chief Complaint  Patient presents with  . Follow-up    ultrasound follow up    The patient returns to the office for followup and review of the noninvasive studies. There has been a deterioration in the lower extremity symptoms.  The patient notes interval shortening of their claudication distance and but no development of mild rest pain symptoms. No new ulcers or wounds have occurred since the last visit.  There have been no significant changes to the patient's overall health care.  The patient denies amaurosis fugax or recent TIA symptoms. There are no recent neurological changes noted. The patient denies history of DVT, PE or superficial thrombophlebitis. The patient denies recent episodes of angina or shortness of breath.   ABI's Rt=0.68 and Lt=0.63 (previous ABI's Rt=0.76 and Lt=0.91) Duplex US of the lower extremity arterial system shows monophasic tibial artery waveforms bilaterally with slightly dampened toe waveforms bilaterally.   Review of Systems  Cardiovascular:       Claudication  All other systems reviewed and are negative.      Objective:   Physical Exam Vitals reviewed.  HENT:     Head: Normocephalic.  Cardiovascular:     Rate and Rhythm: Normal rate and regular rhythm.     Pulses: Decreased pulses.  Pulmonary:     Effort: Pulmonary effort is normal.  Skin:    General: Skin is warm and dry.  Neurological:     Mental Status: He is alert and oriented to person, place, and time.  Psychiatric:        Mood and Affect: Mood normal.        Behavior: Behavior normal.        Thought Content: Thought content normal.        Judgment: Judgment normal.     BP 129/78 (BP Location: Right Arm)   Pulse 94   Resp 16   Wt 165 lb (74.8 kg)   BMI 25.84 kg/m   Past Medical History:  Diagnosis Date  . 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 Diabetes mellitus without complication  ([place])   . Hyperlipidemia   . Hypertension   . Stroke [facility]) 1990'S   MINI STROKE    Social History   Socioeconomic History  . Marital status: Married    Spouse name: Not on file  . Number of children: Not on file  . Years of education: Not on file  . Highest education level: Not on file  Occupational History  . Not on file  Tobacco Use  . Smoking status: Current Some Day Smoker    Packs/day: 0.25    Years: 48.00    Pack years: 12.00    Types: Cigarettes  . Smokeless tobacco: Never Used  Vaping Use  . Vaping Use: Never used  Substance and Sexual Activity  . Alcohol use: Yes    Comment: OCC  . Drug use: No  . Sexual activity: Not on file  Other Topics Concern  . Not on file  Social History Narrative  . Not on file   Social Determinants of Health   Financial Resource Strain:   . Difficulty of Paying Living Expenses: Not on file  Food Insecurity:   . Worried About Charity fundraiser in the Last Year: Not on file  . Ran Out of Food in the Last Year: Not on file  Transportation Needs:   .  Lack of Transportation (Medical): Not on file  . Lack of Transportation (Non-Medical): Not on file  Physical Activity:   . Days of Exercise per Week: Not on file  . Minutes of Exercise per Session: Not on file  Stress:   . Feeling of Stress : Not on file  Social Connections:   . Frequency of Communication with Friends and Family: Not on file  . Frequency of Social Gatherings with Friends and Family: Not on file  . Attends Religious Services: Not on file  . Active Member of Clubs or Organizations: Not on file  . Attends Archivist Meetings: Not on file  . Marital Status: Not on file  Intimate Partner Violence:   . Fear of Current or Ex-Partner: Not on file  . Emotionally Abused: Not on file  . Physically Abused: Not on file  . Sexually Abused: Not on file    Past Surgical History:  Procedure Laterality Date  . Colon cancer removal  10/30/2019  . NO PAST  SURGERIES    . PELVIC LYMPH NODE DISSECTION Bilateral 10/30/2019   Procedure: PELVIC LYMPH NODE DISSECTION;  Surgeon: Hollice Espy, MD;  Location: [facility] ORS;  Service: Urology;  Laterality: Bilateral;  . ROBOT ASSISTED LAPAROSCOPIC RADICAL PROSTATECTOMY N/A 10/30/2019   Procedure: XI ROBOTIC ASSISTED LAPAROSCOPIC RADICAL PROSTATECTOMY;  Surgeon: Hollice Espy, MD;  Location: [facility] ORS;  Service: Urology;  Laterality: N/A;  . UMBILICAL HERNIA REPAIR N/A 10/30/2019   Procedure: HERNIA REPAIR UMBILICAL ADULT;  Surgeon: Hollice Espy, MD;  Location: [facility] ORS;  Service: Urology;  Laterality: N/A;    Family History  Problem Relation Age of Onset  . Diabetes Brother   . Kidney disease Brother     No Known Allergies     Assessment & Plan:   1. 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  The patient recently just finished treatment for prostate cancer and wishes to regain some strength prior to undergoing any endovascular interventions.  We will continue to follow with patient in 6 months for noninvasive studies.  However the patient is instructed to contact her office if he begins to experience worsening claudication, rest pain or ulcerations of his lower extremities.  Likewise if he begins to experience ischemic symptoms such as sudden numbness or cold feeling of the lower extremities he should contact office as soon as possible for follow-up evaluation.  2. Essential hypertension Continue antihypertensive medications as already ordered, these medications have been reviewed and there are no changes at this time.   3. Bilateral carotid artery stenosis The patient has no signs symptoms of worsening carotid disease, will continue with annual follow-up in 6 months.  4. Type 2 diabetes mellitus with diabetic peripheral angiopathy without  gangrene, without long-term current use of insulin ([place]) Continue hypoglycemic medications as already ordered, these medications have been reviewed and there are no changes at this time.  Hgb A1C to be monitored as already arranged by primary service    Current Outpatient Medications on File Prior to Visit  Medication Sig Dispense Refill  . aspirin EC 81 MG tablet Take 81 mg by mouth daily.    Marland Kitchen atorvastatin (LIPITOR) 40 MG tablet Take 40 mg by mouth at bedtime.     . brimonidine (ALPHAGAN) 0.2 % ophthalmic solution Place 1 drop into both eyes 3 (three) times daily.    Marland Kitchen docusate  sodium (COLACE) 100 MG capsule Take 1 capsule (100 mg total) by mouth 2 (two) times daily. 14 capsule 0  . dorzolamide-timolol (COSOPT) 22.3-6.8 MG/ML ophthalmic solution Place 1 drop into both eyes 2 (two) times daily.     Marland Kitchen latanoprost (XALATAN) 0.005 % ophthalmic solution Place 1 drop into both eyes at bedtime.     Marland Kitchen losartan (COZAAR) 25 MG tablet Take 25 mg by mouth every morning.     . metFORMIN (GLUCOPHAGE) 1000 MG tablet Take 1,000 mg by mouth 2 (two) times daily.     . naproxen (NAPROSYN) 500 MG tablet Take 1 tablet (500 mg total) by mouth 2 (two) times daily with a meal. 20 tablet 2  . sildenafil (REVATIO) 20 MG tablet Take 1 tablet (20 mg total) by mouth as needed. Take 1-5 tabs as needed prior to intercourse 30 tablet 11   No current facility-administered medications on file prior to visit.    There are no Patient Instructions on file for this visit. No follow-ups on file.   Kris Hartmann, NP

## 2020-07-05 ENCOUNTER — Ambulatory Visit
Admission: RE | Admit: 2020-07-05 | Discharge: 2020-07-05 | Disposition: A | Discharge status: Home / Self Care | Payer: Medicare Other | Source: Ambulatory Visit | Attending: Radiation Oncology | Admitting: Radiation Oncology

## 2020-07-05 ENCOUNTER — Other Ambulatory Visit: Payer: Self-pay

## 2020-07-05 ENCOUNTER — Encounter: Payer: Self-pay | Admitting: Radiation Oncology

## 2020-07-05 VITALS — BP 145/91 | HR 86 | Temp 95.1°F | Resp 16 | Wt 165.1 lb

## 2020-07-05 DIAGNOSIS — Z923 Personal history of irradiation: Secondary | ICD-10-CM | POA: Diagnosis not present

## 2020-07-05 DIAGNOSIS — R351 Nocturia: Secondary | ICD-10-CM | POA: Insufficient documentation

## 2020-07-05 DIAGNOSIS — C61 Malignant neoplasm of prostate: Secondary | ICD-10-CM | POA: Diagnosis not present

## 2020-07-05 NOTE — Progress Notes (Signed)
Radiation Oncology Follow up Note  Name: Allen Massey   Date:   07/05/2020 MRN:  [id number] DOB: 15-Oct-1950    This 69 y.o. male presents to the clinic today for 1 month follow-up status post IMRT radiation therapy and salvage mode status post robotic assisted prostatectomy for stage IIb (T3b N0 M0) mostly Gleason 7 (3+4) with rising PSA.  REFERRING PROVIDER: Center, [facility]*  HPI: Patient is a 69 year old male diagnosed in October 2020.  When he presented with adenocarcinoma the prostate with a PSA of 8.1.  He underwent biopsy and subsequent robotic assisted prostatectomy showing Gleason 7 (3+4) adenocarcinoma.  Margins were positive focal in the left anterior left posterior region with 2 lymph nodes negative for malignancy.  There was perineural invasion.  His PSA rose to 1.7 and then underwent salvage radiation he is seen 1 month out doing fairly well.  He states he is slightly more nocturia up to 4 times a night from a baseline of approximately 2 times prior to his radiation.  He is having no bowel problems specifically [place] diarrhea or bone pain.  COMPLICATIONS OF TREATMENT: none  FOLLOW UP COMPLIANCE: keeps appointments   PHYSICAL EXAM:  BP (!) 145/91 (BP Location: Left Arm, Patient Position: Sitting)   Pulse 86   Temp (!) 95.1 F (35.1 C) (Tympanic)   Resp 16   Wt 165 lb 1.6 oz (74.9 kg)   BMI 25.86 kg/m  Well-developed well-nourished patient in NAD. HEENT reveals PERLA, EOMI, discs not visualized.  Oral cavity is clear. No oral mucosal lesions are identified. Neck is clear without evidence of cervical or supraclavicular adenopathy. Lungs are clear to A&P. Cardiac examination is essentially unremarkable with regular rate and rhythm without murmur rub or thrill. Abdomen is benign with no organomegaly or masses noted. Motor sensory and DTR levels are equal and symmetric in the upper and lower extremities. Cranial nerves II through XII are grossly intact. Proprioception is  intact. No peripheral adenopathy or edema is identified. No motor or sensory levels are noted. Crude visual fields are within normal range.  RADIOLOGY RESULTS: No current films to review  PLAN: Patient is currently doing well with low side effect profile.  Of asked him to start working low sugar cranberry juice to help with some of his nocturia.  He continues close follow-up care with Dr. Erlene Quan.  Present time I have asked to see him back in 3 months for follow-up with a PSA at that time.  Patient knows to call with any concerns at any time.  I would like to take this opportunity to thank you for allowing me to participate in the care of your patient.Noreene Filbert, MD

## 2020-07-09 ENCOUNTER — Other Ambulatory Visit: Payer: Medicare Other

## 2020-07-09 ENCOUNTER — Other Ambulatory Visit: Payer: Self-pay

## 2020-07-09 DIAGNOSIS — C61 Malignant neoplasm of prostate: Secondary | ICD-10-CM

## 2020-07-10 LAB — PSA: Prostate Specific Ag, Serum: <0.1 ng/mL (ref 0.0–4.0)

## 2020-07-10 NOTE — Progress Notes (Signed)
07/11/2020 11:13 AM   Allen Massey [birth year] [id number]  Referring provider: Center, [facility] [place],  [place] [address] Chief Complaint  Patient presents with  . Prostate Cancer    HPI: Allen Massey is a 68 y.o. male who is seen today for a 5 month follow up of prostate cancer status post prostatectomy 5 months ago followed by salvage radiation.  He had a XI robotic assisted laparoscopic radical prostatectomy on 10/30/19. His surgical pathology report from 11/01/19 indicated gleason 3+4 with less than 5% of a tertiary pattern -EPE focal -SV +margin - lymph nodes pT2pN0. Intraoperatively, residual tumor/prostate lon posterior plane.  Patient underwent IMRT treatment from 03/26/2020 to 05/28/2020.  He received one 28-month Depo Eligard adjuvantly.  Post op PSAfrom 12/12/2019 was 1.1. PSA is undetectable as of 07/09/2020.   Patient was seen in ED in 02/2020 for groin swelling. The patient complained about right testicular pain and swelling x 2 days. Patient denied penile discharge, he reported he was sexually active, he was not concerned about STDs.  He denied dysuria.  No fevers or chills.  No abdominal pain.  No nausea or vomiting.  UA noted small blood, large leukocytes, 6-10 RBC, >50 WBC and no bacteria. Urine culture grew multiple species. Scrotum US consistent with right epididymitis. Patient was treated with Cipro and Naproxen.   Follow up scrotum US on 04/21/2020 noted unremarkable testicles and possible right epididymitis.   He reports staying damp down there. He denies wearing pads or diapers and is not bothered by incontinence. No hematuria or dydruria. He has no issues urination.  He notes occasional swelling in testicles. Denies swelling today.   Diarrhea has resolved s/p radiation. He reports tolerating radiation well.   ED is well controlled with generic Viagra.   PSA trend: Component     Latest Ref Rng & Units 07/05/2019  12/12/2019 02/14/2020 07/09/2020  Prostate Specific Ag, Serum     0.0 - 4.0 ng/mL 8.1 (H) 1.1 1.7 <0.1     PMH: Past Medical History:  Diagnosis Date  . Cancer ([place])    PROSTATE  . Diabetes mellitus without complication ([place])   . Hyperlipidemia   . Hypertension   . Stroke [facility]) 1990'S   MINI STROKE    Surgical History: Past Surgical History:  Procedure Laterality Date  . Colon cancer removal  10/30/2019  . NO PAST SURGERIES    . PELVIC LYMPH NODE DISSECTION Bilateral 10/30/2019   Procedure: PELVIC LYMPH NODE DISSECTION;  Surgeon: Hollice Espy, MD;  Location: [facility] ORS;  Service: Urology;  Laterality: Bilateral;  . ROBOT ASSISTED LAPAROSCOPIC RADICAL PROSTATECTOMY N/A 10/30/2019   Procedure: XI ROBOTIC ASSISTED LAPAROSCOPIC RADICAL PROSTATECTOMY;  Surgeon: Hollice Espy, MD;  Location: [facility] ORS;  Service: Urology;  Laterality: N/A;  . UMBILICAL HERNIA REPAIR N/A 10/30/2019   Procedure: HERNIA REPAIR UMBILICAL ADULT;  Surgeon: Hollice Espy, MD;  Location: [facility] ORS;  Service: Urology;  Laterality: N/A;    Home Medications:  Allergies as of 07/11/2020   No Known Allergies     Medication List       Accurate as of July 11, 2020 11:13 AM. If you have any questions, ask your nurse or doctor.        aspirin EC 81 MG tablet Take 81 mg by mouth daily.   atorvastatin 40 MG tablet Commonly known as: LIPITOR Take 40 mg by mouth at bedtime.   brimonidine 0.2 % ophthalmic solution Commonly known as: ALPHAGAN Place 1 drop  into both eyes 3 (three) times daily.   docusate sodium 100 MG capsule Commonly known as: COLACE Take 1 capsule (100 mg total) by mouth 2 (two) times daily.   dorzolamide-timolol 22.3-6.8 MG/ML ophthalmic solution Commonly known as: COSOPT Place 1 drop into both eyes 2 (two) times daily.   latanoprost 0.005 % ophthalmic solution Commonly known as: XALATAN Place 1 drop into both eyes at bedtime.   losartan 25 MG tablet Commonly known as:  COZAAR Take 25 mg by mouth every morning.   metFORMIN 1000 MG tablet Commonly known as: GLUCOPHAGE Take 1,000 mg by mouth 2 (two) times daily.   naproxen 500 MG tablet Commonly known as: Naprosyn Take 1 tablet (500 mg total) by mouth 2 (two) times daily with a meal.   sildenafil 20 MG tablet Commonly known as: Revatio Take 1 tablet (20 mg total) by mouth as needed. Take 1-5 tabs as needed prior to intercourse       Allergies: No Known Allergies  Family History: Family History  Problem Relation Age of Onset  . Diabetes Brother   . Kidney disease Brother     Social History:  reports that he has been smoking cigarettes. He has a 12.00 pack-year smoking history. He has never used smokeless tobacco. He reports current alcohol use. He reports that he does not use drugs.   Physical Exam: BP (!) 172/91 (BP Location: Left Arm, Patient Position: Sitting, Cuff Size: Normal)   Pulse 93   Ht 5\' 6"  (1.676 m)   Wt 165 lb (74.8 kg)   BMI 26.63 kg/m   Constitutional:  Alert and oriented, No acute distress. HEENT: [place] AT, moist mucus membranes.  Trachea midline, no masses. Cardiovascular: No clubbing, cyanosis, or edema. Respiratory: Normal respiratory effort, no increased work of breathing. GI: Abdomen is soft, nontender, nondistended, no abdominal masses GU: No CVA tenderness.  Testicles descended bilaterally, nontender, no swelling or hydrocele. Skin: No rashes, bruises or suspicious lesions. Neurologic: Grossly intact, no focal deficits, moving all 4 extremities. Psychiatric: Normal mood and affect.  Laboratory Data:  Lab Results  Component Value Date   CREATININE 1.24 04/21/2020     Lab Results  Component Value Date   HGBA1C 7.8 (H) 10/30/2019     Assessment & Plan:    1. Prostate cancer NED S/p prostatectomy and IMRT w/ 6 mo ADT PSA is undetectable  Continue PSA q6 months.   2. History of testicular swelling Patient has been seen in ER on multiple  occasions. Asymptomatic today Exam was benign Advise patient to RTC when swelling/pain occurs.   3. 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 Patient requested refills.  -Sildenafil Rx sent to pharmacy.   Follow-up 6 months with PSA prior  [facility] [address] [place], [place] [address] [phone]  I, Selena Batten, am acting as a scribe for Dr. Hollice Espy.  I have reviewed the above documentation for accuracy and completeness, and I agree with the above.   Hollice Espy, MD

## 2020-07-11 ENCOUNTER — Encounter: Payer: Self-pay | Admitting: Urology

## 2020-07-11 ENCOUNTER — Other Ambulatory Visit: Payer: Self-pay

## 2020-07-11 ENCOUNTER — Ambulatory Visit (INDEPENDENT_AMBULATORY_CARE_PROVIDER_SITE_OTHER): Payer: Medicare Other | Admitting: Urology

## 2020-07-11 VITALS — BP 172/91 | HR 93 | Ht 66.0 in | Wt 165.0 lb

## 2020-07-11 DIAGNOSIS — I6523 Occlusion and stenosis of bilateral carotid arteries: Secondary | ICD-10-CM | POA: Diagnosis not present

## 2020-07-11 DIAGNOSIS — N529 Male erectile dysfunction, unspecified: Secondary | ICD-10-CM | POA: Diagnosis not present

## 2020-07-11 MED ORDER — SILDENAFIL CITRATE 20 MG PO TABS: 20.0000 mg | ORAL_TABLET | ORAL | 11 refills | Status: DC | PRN

## 2020-09-19 ENCOUNTER — Telehealth: Payer: Self-pay | Admitting: *Deleted

## 2020-09-19 ENCOUNTER — Encounter: Payer: Self-pay | Admitting: *Deleted

## 2020-09-19 NOTE — Telephone Encounter (Signed)
Attempted to contact to schedule lung screening scan. There is no answer or voicemail option. Will send mychart message.

## 2020-10-02 ENCOUNTER — Inpatient Hospital Stay: Payer: Medicare Other | Attending: Radiation Oncology

## 2020-10-02 DIAGNOSIS — C61 Malignant neoplasm of prostate: Secondary | ICD-10-CM | POA: Diagnosis not present

## 2020-10-02 LAB — CBC
HCT: 34.8 % — ABNORMAL LOW (ref 39.0–52.0)
Hemoglobin: 11.9 g/dL — ABNORMAL LOW (ref 13.0–17.0)
MCH: 31.7 pg (ref 26.0–34.0)
MCHC: 34.2 g/dL (ref 30.0–36.0)
MCV: 92.8 fL (ref 80.0–100.0)
Platelets: 191 10*3/uL (ref 150–400)
RBC: 3.75 MIL/uL — ABNORMAL LOW (ref 4.22–5.81)
RDW: 11.8 % (ref 11.5–15.5)
WBC: 5.2 10*3/uL (ref 4.0–10.5)
nRBC: 0 % (ref 0.0–0.2)

## 2020-10-07 ENCOUNTER — Ambulatory Visit: Payer: Self-pay

## 2020-10-07 ENCOUNTER — Other Ambulatory Visit: Payer: Self-pay

## 2020-10-07 ENCOUNTER — Encounter: Payer: Self-pay | Admitting: Radiation Oncology

## 2020-10-07 ENCOUNTER — Ambulatory Visit
Admission: RE | Admit: 2020-10-07 | Discharge: 2020-10-07 | Disposition: A | Discharge status: Home / Self Care | Payer: Medicare Other | Source: Ambulatory Visit | Attending: Radiation Oncology | Admitting: Radiation Oncology

## 2020-10-07 VITALS — BP 188/100 | HR 91 | Temp 96.2°F | Wt 165.2 lb

## 2020-10-07 DIAGNOSIS — Z923 Personal history of irradiation: Secondary | ICD-10-CM | POA: Insufficient documentation

## 2020-10-07 DIAGNOSIS — C61 Malignant neoplasm of prostate: Secondary | ICD-10-CM

## 2020-10-07 NOTE — Progress Notes (Signed)
Radiation Oncology Follow up Note  Name: Allen Massey   Date:   10/07/2020 MRN:  [id number] DOB: 1950/10/24    This 70 y.o. male presents to the clinic today for 3-month follow-up status post IMRT radiation therapy and salvage mode status post robotic assisted prostatectomy for stage IIb (T3b N0 M0) mostly Gleason 7 (3+4) with biochemical failure.  REFERRING PROVIDER: [facility], [employer]*  HPI: Patient is a 70 year old male now out 4 months having completed salvage radiation therapy to his prostatic fossa and pelvic nodes for Gleason 7 adenocarcinoma the prostate status post robotic assisted prostatectomy with focal positive margins.  He is seen today in routine follow-up is doing well specifically denies any increased lower urinary tract symptoms diarrhea or fatigue.Marland Kitchen  His most recent PSA is less than 0.1.  COMPLICATIONS OF TREATMENT: none  FOLLOW UP COMPLIANCE: keeps appointments   PHYSICAL EXAM:  BP (!) 188/100   Pulse 91   Temp (!) 96.2 F (35.7 C) (Tympanic)   Wt 165 lb 3.2 oz (74.9 kg)   BMI 26.66 kg/m  Well-developed well-nourished patient in NAD. HEENT reveals PERLA, EOMI, discs not visualized.  Oral cavity is clear. No oral mucosal lesions are identified. Neck is clear without evidence of cervical or supraclavicular adenopathy. Lungs are clear to A&P. Cardiac examination is essentially unremarkable with regular rate and rhythm without murmur rub or thrill. Abdomen is benign with no organomegaly or masses noted. Motor sensory and DTR levels are equal and symmetric in the upper and lower extremities. Cranial nerves II through XII are grossly intact. Proprioception is intact. No peripheral adenopathy or edema is identified. No motor or sensory levels are noted. Crude visual fields are within normal range.  RADIOLOGY RESULTS: No current films for review  PLAN: Present time patient is under excellent biochemical control of his prostate cancer.  I am pleased with his overall  progress and limited side effects profile.  I have asked to see him back in 6 months for follow-up.  He continues close follow-up care with urology.  Patient knows to call with any concerns.  I would like to take this opportunity to thank you for allowing me to participate in the care of your patient.Allen Miller, MD

## 2020-10-28 ENCOUNTER — Telehealth: Payer: Self-pay | Admitting: *Deleted

## 2020-10-28 NOTE — Telephone Encounter (Signed)
Attempted to call patient to schedule for follow up CT Lung Screening, unable to leave vm on patients phone at this time.

## 2020-11-05 ENCOUNTER — Other Ambulatory Visit: Payer: Self-pay | Admitting: *Deleted

## 2020-11-05 DIAGNOSIS — Z122 Encounter for screening for malignant neoplasm of respiratory organs: Secondary | ICD-10-CM

## 2020-11-05 DIAGNOSIS — Z87891 Personal history of nicotine dependence: Secondary | ICD-10-CM

## 2020-11-05 NOTE — Progress Notes (Signed)
Contacted and scheduled for annual lung screening scan. Patient is a current smoker with a 48.2 pack year history

## 2020-11-14 ENCOUNTER — Ambulatory Visit
Admission: RE | Admit: 2020-11-14 | Discharge: 2020-11-14 | Disposition: A | Discharge status: Home / Self Care | Payer: Medicare Other | Source: Ambulatory Visit | Attending: Oncology | Admitting: Oncology

## 2020-11-14 ENCOUNTER — Other Ambulatory Visit: Payer: Self-pay

## 2020-11-14 DIAGNOSIS — Z87891 Personal history of nicotine dependence: Secondary | ICD-10-CM

## 2020-11-14 DIAGNOSIS — Z122 Encounter for screening for malignant neoplasm of respiratory organs: Secondary | ICD-10-CM | POA: Insufficient documentation

## 2020-11-18 ENCOUNTER — Encounter: Payer: Self-pay | Admitting: *Deleted

## 2020-12-23 ENCOUNTER — Other Ambulatory Visit (INDEPENDENT_AMBULATORY_CARE_PROVIDER_SITE_OTHER): Payer: Self-pay | Admitting: Nurse Practitioner

## 2020-12-23 ENCOUNTER — Encounter (INDEPENDENT_AMBULATORY_CARE_PROVIDER_SITE_OTHER): Payer: Medicare Other

## 2020-12-23 ENCOUNTER — Ambulatory Visit (INDEPENDENT_AMBULATORY_CARE_PROVIDER_SITE_OTHER): Payer: Medicare Other | Admitting: Vascular Surgery

## 2020-12-23 DIAGNOSIS — I6523 Occlusion and stenosis of bilateral carotid arteries: Secondary | ICD-10-CM

## 2020-12-23 DIAGNOSIS — I739 Peripheral vascular disease, unspecified: Secondary | ICD-10-CM

## 2020-12-24 ENCOUNTER — Other Ambulatory Visit (INDEPENDENT_AMBULATORY_CARE_PROVIDER_SITE_OTHER): Payer: Self-pay | Admitting: Nurse Practitioner

## 2020-12-24 DIAGNOSIS — I6523 Occlusion and stenosis of bilateral carotid arteries: Secondary | ICD-10-CM

## 2020-12-24 DIAGNOSIS — I70219 Atherosclerosis of native arteries of extremities with intermittent claudication, unspecified extremity: Secondary | ICD-10-CM

## 2020-12-25 ENCOUNTER — Ambulatory Visit (INDEPENDENT_AMBULATORY_CARE_PROVIDER_SITE_OTHER): Payer: Medicare Other | Admitting: Nurse Practitioner

## 2020-12-25 ENCOUNTER — Encounter (INDEPENDENT_AMBULATORY_CARE_PROVIDER_SITE_OTHER): Payer: Self-pay

## 2020-12-25 ENCOUNTER — Other Ambulatory Visit: Payer: Self-pay

## 2020-12-25 ENCOUNTER — Ambulatory Visit (INDEPENDENT_AMBULATORY_CARE_PROVIDER_SITE_OTHER): Payer: Medicare Other

## 2020-12-25 VITALS — BP 146/86 | HR 76 | Resp 16 | Wt 162.6 lb

## 2020-12-25 DIAGNOSIS — E1151 Type 2 diabetes mellitus with diabetic peripheral angiopathy without gangrene: Secondary | ICD-10-CM

## 2020-12-25 DIAGNOSIS — I10 Essential (primary) hypertension: Secondary | ICD-10-CM | POA: Diagnosis not present

## 2020-12-25 DIAGNOSIS — I70219 Atherosclerosis of native arteries of extremities with intermittent claudication, unspecified extremity: Secondary | ICD-10-CM

## 2020-12-25 DIAGNOSIS — I6523 Occlusion and stenosis of bilateral carotid arteries: Secondary | ICD-10-CM

## 2021-01-04 ENCOUNTER — Encounter (INDEPENDENT_AMBULATORY_CARE_PROVIDER_SITE_OTHER): Payer: Self-pay | Admitting: Nurse Practitioner

## 2021-01-04 NOTE — Progress Notes (Signed)
Subjective:    Patient ID: Allen Massey, male    DOB: 1951-02-16, 70 y.o.   MRN: [id number] Chief Complaint  Patient presents with  . Follow-up    Ultrasound follow up    Zevin Nevares is a 70 year old male that returns to the office for followup and review of the noninvasive studies for his PAD and carotid artery stenosis. There have been no interval changes in lower extremity symptoms. No interval shortening of the patient's claudication distance or development of rest pain symptoms. No new ulcers or wounds have occurred since the last visit.  There have been no significant changes to the patient's overall health care.  The patient denies amaurosis fugax or recent TIA symptoms. There are no recent neurological changes noted. The patient denies history of DVT, PE or superficial thrombophlebitis. The patient denies recent episodes of angina or shortness of breath.   ABI Rt=0.82 and Lt=0.82  (previous ABI's Rt=0.76 and Lt=0.91) Duplex ultrasound of the bilateral tibial arteries have strong monophasic waveforms with good toe waveforms bilaterally.  Carotid Duplex done today shows less than 40% stenosis in the right ICA with a known occlusion of the left ICA.  This is consistent with previous studies.   Review of Systems  Cardiovascular: Negative for leg swelling.  Neurological: Negative for dizziness and weakness.  All other systems reviewed and are negative.      Objective:   Physical Exam Vitals reviewed.  HENT:     Head: Normocephalic.  Neck:     Vascular: No carotid bruit.  Cardiovascular:     Rate and Rhythm: Normal rate.     Pulses:          Dorsalis pedis pulses are 1+ on the right side and 1+ on the left side.       Posterior tibial pulses are 1+ on the right side and 1+ on the left side.  Pulmonary:     Effort: Pulmonary effort is normal.  Skin:    General: Skin is warm and dry.  Neurological:     Mental Status: He is alert and oriented to person, place, and  time.  Psychiatric:        Mood and Affect: Mood normal.        Behavior: Behavior normal.        Thought Content: Thought content normal.        Judgment: Judgment normal.     BP (!) 146/86 (BP Location: Right Arm)   Pulse 76   Resp 16   Wt 162 lb 9.6 oz (73.8 kg)   BMI 26.24 kg/m   Past Medical History:  Diagnosis Date  . Cancer ([place])    PROSTATE  . Diabetes mellitus without complication ([place])   . Hyperlipidemia   . Hypertension   . Stroke [facility]) 1990'S   MINI STROKE    Social History   Socioeconomic History  . Marital status: Married    Spouse name: Not on file  . Number of children: Not on file  . Years of education: Not on file  . Highest education level: Not on file  Occupational History  . Not on file  Tobacco Use  . Smoking status: Current Some Day Smoker    Packs/day: 0.25    Years: 48.00    Pack years: 12.00    Types: Cigarettes  . Smokeless tobacco: Never Used  Vaping Use  . Vaping Use: Never used  Substance and Sexual Activity  . Alcohol use: Yes  Comment: OCC  . Drug use: No  . Sexual activity: Not on file  Other Topics Concern  . Not on file  Social History Narrative  . Not on file   Social Determinants of Health   Financial Resource Strain: Not on file  Food Insecurity: Not on file  Transportation Needs: Not on file  Physical Activity: Not on file  Stress: Not on file  Social Connections: Not on file  Intimate Partner Violence: Not on file    Past Surgical History:  Procedure Laterality Date  . Colon cancer removal  10/30/2019  . NO PAST SURGERIES    . PELVIC LYMPH NODE DISSECTION Bilateral 10/30/2019   Procedure: PELVIC LYMPH NODE DISSECTION;  Surgeon: Hollice Espy, MD;  Location: [facility] ORS;  Service: Urology;  Laterality: Bilateral;  . ROBOT ASSISTED LAPAROSCOPIC RADICAL PROSTATECTOMY N/A 10/30/2019   Procedure: XI ROBOTIC ASSISTED LAPAROSCOPIC RADICAL PROSTATECTOMY;  Surgeon: Hollice Espy, MD;  Location: [facility] ORS;   Service: Urology;  Laterality: N/A;  . UMBILICAL HERNIA REPAIR N/A 10/30/2019   Procedure: HERNIA REPAIR UMBILICAL ADULT;  Surgeon: Hollice Espy, MD;  Location: [facility] ORS;  Service: Urology;  Laterality: N/A;    Family History  Problem Relation Age of Onset  . Diabetes Brother   . Kidney disease Brother     No Known Allergies  CBC Latest Ref Rng & Units 10/02/2020 05/02/2020 04/21/2020  WBC 4.0 - 10.5 K/uL 5.2 6.2 5.5  Hemoglobin 13.0 - 17.0 g/dL 11.9(L) 14.5 14.4  Hematocrit 39.0 - 52.0 % 34.8(L) 42.3 43.6  Platelets 150 - 400 K/uL 191 189 162      CMP     Component Value Date/Time   NA 141 04/21/2020 0059   NA 139 05/11/2014 0507   K 4.2 04/21/2020 0059   K 3.5 05/11/2014 0507   CL 106 04/21/2020 0059   CL 106 05/11/2014 0507   CO2 29 04/21/2020 0059   CO2 26 05/11/2014 0507   GLUCOSE 125 (H) 04/21/2020 0059   GLUCOSE 137 (H) 05/11/2014 0507   BUN 20 04/21/2020 0059   BUN 10 05/11/2014 0507   CREATININE 1.24 04/21/2020 0059   CREATININE 1.07 05/11/2014 0507   CALCIUM 9.1 04/21/2020 0059   CALCIUM 8.6 05/11/2014 0507   PROT 7.2 04/21/2020 0059   PROT 7.1 05/10/2014 0937   ALBUMIN 3.8 04/21/2020 0059   ALBUMIN 3.2 (L) 05/10/2014 0937   AST 18 04/21/2020 0059   AST 26 05/10/2014 0937   ALT 16 04/21/2020 0059   ALT 19 05/10/2014 0937   ALKPHOS 43 04/21/2020 0059   ALKPHOS 61 05/10/2014 0937   BILITOT 0.6 04/21/2020 0059   BILITOT 1.1 (H) 05/10/2014 0937   GFRNONAA 59 (L) 04/21/2020 0059   GFRNONAA >60 05/11/2014 0507   GFRAA >60 04/21/2020 0059   GFRAA >60 05/11/2014 0507     VAS [place] ABI WITH/WO TBI  Result Date: 12/30/2020 LOWER EXTREMITY DOPPLER STUDY  Comparison Study: 12/06/2019 Performing Technologist: Allen Massey RVS  Examination Guidelines: A complete evaluation includes at minimum, Doppler waveform signals and systolic blood pressure reading at the level of bilateral brachial, anterior tibial, and posterior tibial arteries, when vessel segments  are accessible. Bilateral testing is considered an integral part of a complete examination. Photoelectric Plethysmograph (PPG) waveforms and toe systolic pressure readings are included as required and additional duplex testing as needed. Limited examinations for reoccurring indications may be performed as noted.  ABI Findings: +---------+------------------+-----+----------+--------+ Right    Rt Pressure (mmHg)IndexWaveform  Comment  +---------+------------------+-----+----------+--------+ Brachial  143                                       +---------+------------------+-----+----------+--------+ ATA      117               0.82 monophasic         +---------+------------------+-----+----------+--------+ PTA      112               0.78 monophasic         +---------+------------------+-----+----------+--------+ Great Toe58                0.41 Abnormal           +---------+------------------+-----+----------+--------+ +---------+------------------+-----+----------+-------+ Left     Lt Pressure (mmHg)IndexWaveform  Comment +---------+------------------+-----+----------+-------+ Brachial 142                                      +---------+------------------+-----+----------+-------+ ATA      117               0.82 monophasic        +---------+------------------+-----+----------+-------+ PTA      94                0.66 monophasic        +---------+------------------+-----+----------+-------+ Great Toe104               0.73 Normal            +---------+------------------+-----+----------+-------+ +-------+-----------+-----------+------------+------------+ ABI/TBIToday's ABIToday's TBIPrevious ABIPrevious TBI +-------+-----------+-----------+------------+------------+ Right  .82        .41        .76         .61          +-------+-----------+-----------+------------+------------+ Left   .82        .73        .91         .57           +-------+-----------+-----------+------------+------------+ Right ABIs appear increased compared to prior study on 12/06/2019. Left ABIs appear decreased compared to prior study on 12/06/2019. Right TBIs appear decreased compared to prior study on 12/06/2019. Left TBIs appear increased compared to prior study on 12/06/2019.  Summary: Right: Resting right ankle-brachial index indicates mild right lower extremity arterial disease. The right toe-brachial index is abnormal. Left: Resting left ankle-brachial index indicates mild left lower extremity arterial disease. The left toe-brachial index is normal.  *See table(s) above for measurements and observations.  Electronically signed by Hortencia Pilar MD on 12/30/2020 at 5:08:00 PM.    Final        Assessment & Plan:   1. Bilateral carotid artery stenosis Recommend:  Given the patient's asymptomatic subcritical stenosis no further invasive testing or surgery at this time.  Duplex ultrasound shows less than 40% stenosis of the right ICA with an occluded left ICA.  Continue antiplatelet therapy as prescribed Continue management of CAD, HTN and Hyperlipidemia Healthy heart diet,  encouraged exercise at least 4 times per week Follow up in 12 months with duplex ultrasound and physical exam   2. 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  The patient does not voice lifestyle limiting changes at this point in time.  Noninvasive  studies do not suggest clinically significant change.  No invasive studies, angiography or surgery at this time The patient should continue walking and begin a more formal exercise program.  The patient should continue antiplatelet therapy and aggressive treatment of the lipid abnormalities  No changes in the patient's medications at this time  The patient  should continue wearing graduated compression socks 10-15 mmHg strength to control the mild edema.   Patient will return in 6 months or sooner if symptoms worsen. 3. Essential hypertension Continue antihypertensive medications as already ordered, these medications have been reviewed and there are no changes at this time.   4. Type 2 diabetes mellitus with diabetic peripheral angiopathy without gangrene, without long-term current use of insulin ([place]) Continue hypoglycemic medications as already ordered, these medications have been reviewed and there are no changes at this time.  Hgb A1C to be monitored as already arranged by primary service    Current Outpatient Medications on File Prior to Visit  Medication Sig Dispense Refill  . aspirin EC 81 MG tablet Take 81 mg by mouth daily.    Marland Kitchen atorvastatin (LIPITOR) 40 MG tablet Take 40 mg by mouth at bedtime.     . brimonidine (ALPHAGAN) 0.2 % ophthalmic solution Place 1 drop into both eyes 3 (three) times daily.    Marland Kitchen docusate sodium (COLACE) 100 MG capsule Take 1 capsule (100 mg total) by mouth 2 (two) times daily. 14 capsule 0  . dorzolamide-timolol (COSOPT) 22.3-6.8 MG/ML ophthalmic solution Place 1 drop into both eyes 2 (two) times daily.     Marland Kitchen latanoprost (XALATAN) 0.005 % ophthalmic solution Place 1 drop into both eyes at bedtime.     Marland Kitchen losartan (COZAAR) 25 MG tablet Take 25 mg by mouth every morning.     . metFORMIN (GLUCOPHAGE) 1000 MG tablet Take 1,000 mg by mouth 2 (two) times daily.     . naproxen (NAPROSYN) 500 MG tablet Take 1 tablet (500 mg total) by mouth 2 (two) times daily with a meal. 20 tablet 2  . sildenafil (REVATIO) 20 MG tablet Take 1 tablet (20 mg total) by mouth as needed. Take 1-5 tabs as needed prior to intercourse 30 tablet 11   No current facility-administered medications on file prior to visit.    There are no Patient Instructions on file for this visit. No follow-ups on file.   Kris Hartmann, NP

## 2021-01-06 ENCOUNTER — Other Ambulatory Visit: Payer: Self-pay

## 2021-01-06 DIAGNOSIS — C61 Malignant neoplasm of prostate: Secondary | ICD-10-CM

## 2021-01-07 ENCOUNTER — Other Ambulatory Visit: Payer: Medicare Other

## 2021-01-07 ENCOUNTER — Other Ambulatory Visit: Payer: Self-pay

## 2021-01-07 DIAGNOSIS — C61 Malignant neoplasm of prostate: Secondary | ICD-10-CM

## 2021-01-07 NOTE — Progress Notes (Signed)
01/08/2021  12:55 PM   Allen Massey 1951-05-22 [id number]  Referring provider: [facility], [facility] [place],  [place] [address] Chief Complaint  Patient presents with  . Prostate Cancer    7mo w/PSA follow up    HPI: Allen Massey is a 70 y.o. male who presents today for a 6 month  follow up of recent PSA value.   He has a personal history of high risk prostate cancer.  He is s/p prostatectomy on 10/30/2019. His surgical pathology report from 11/01/19 indicated gleason 3+4 with less than 5% of a tertiary pattern -EPE focal -SV +margin - lymph nodes pT2pN0. Intraoperatively, residual tumor/prostate lon posterior plane.  Resection was subtotal.  Postoperatively, his PSA was detectable and subsequently rose.  He underwent 6 months of ADT and salvage radiation which he tolerated well.  His final radiation treatment was 05/2020.  No longer takes OTC Viagra for his ED. Because they were not working, but he ran out of it. Patient was prescribed increased dose of Viagra prescription. Patient was only using 50 mg of Viagra but it was not working. Patient was accompanied by his wife.  Today the patient complained of intermittent hot flashes all day and all night. Not getting any less frequent.  Last ADT given in 6 months to perform about a year ago.  He reported that he has been urinating well.  No significant incontinence.  PSA remains undetectable.   PMH: Past Medical History:  Diagnosis Date  . Cancer ([place])    PROSTATE  . Diabetes mellitus without complication ([place])   . Hyperlipidemia   . Hypertension   . Stroke [facility]) 1990'S   MINI STROKE    Surgical History: Past Surgical History:  Procedure Laterality Date  . Colon cancer removal  10/30/2019  . NO PAST SURGERIES    . PELVIC LYMPH NODE DISSECTION Bilateral 10/30/2019   Procedure: PELVIC LYMPH NODE DISSECTION;  Surgeon: Hollice Espy, MD;  Location: [facility] ORS;  Service: Urology;  Laterality:  Bilateral;  . ROBOT ASSISTED LAPAROSCOPIC RADICAL PROSTATECTOMY N/A 10/30/2019   Procedure: XI ROBOTIC ASSISTED LAPAROSCOPIC RADICAL PROSTATECTOMY;  Surgeon: Hollice Espy, MD;  Location: [facility] ORS;  Service: Urology;  Laterality: N/A;  . UMBILICAL HERNIA REPAIR N/A 10/30/2019   Procedure: HERNIA REPAIR UMBILICAL ADULT;  Surgeon: Hollice Espy, MD;  Location: [facility] ORS;  Service: Urology;  Laterality: N/A;    Home Medications:  Allergies as of 01/08/2021   No Known Allergies     Medication List       Accurate as of January 08, 2021 12:55 PM. If you have any questions, ask your nurse or doctor.        aspirin EC 81 MG tablet Take 81 mg by mouth daily.   atorvastatin 40 MG tablet Commonly known as: LIPITOR Take 40 mg by mouth at bedtime.   brimonidine 0.2 % ophthalmic solution Commonly known as: ALPHAGAN Place 1 drop into both eyes 3 (three) times daily.   docusate sodium 100 MG capsule Commonly known as: COLACE Take 1 capsule (100 mg total) by mouth 2 (two) times daily.   dorzolamide-timolol 22.3-6.8 MG/ML ophthalmic solution Commonly known as: COSOPT Place 1 drop into both eyes 2 (two) times daily.   latanoprost 0.005 % ophthalmic solution Commonly known as: XALATAN Place 1 drop into both eyes at bedtime.   losartan 25 MG tablet Commonly known as: COZAAR Take 25 mg by mouth every morning.   metFORMIN 1000 MG tablet Commonly known as: GLUCOPHAGE  Take 1,000 mg by mouth 2 (two) times daily.   naproxen 500 MG tablet Commonly known as: Naprosyn Take 1 tablet (500 mg total) by mouth 2 (two) times daily with a meal.   sildenafil 20 MG tablet Commonly known as: Revatio Take 1 tablet (20 mg total) by mouth as needed. Take 1-5 tabs as needed prior to intercourse       Allergies: No Known Allergies  Family History: Family History  Problem Relation Age of Onset  . Diabetes Brother   . Kidney disease Brother     Social History:  reports that he has been smoking  cigarettes. He has a 12.00 pack-year smoking history. He has never used smokeless tobacco. He reports current alcohol use. He reports that he does not use drugs.   Physical Exam: BP 122/79   Pulse 92   Ht 5\' 6"  (1.676 m)   Wt 166 lb (75.3 kg)   BMI 26.79 kg/m   Constitutional:  Alert and oriented, No acute distress.  Accompanied by granddaughter today. HEENT: [place] AT, moist mucus membranes.  Trachea midline, no masses. Cardiovascular: No clubbing, cyanosis, or edema. Respiratory: Normal respiratory effort, no increased work of breathing. Skin: No rashes, bruises or suspicious lesions. Neurologic: Grossly intact, no focal deficits, moving all 4 extremities. Psychiatric: Normal mood and affect.  Laboratory Data: Component     Latest Ref Rng & Units 07/05/2019 12/12/2019 02/14/2020 07/09/2020  Prostate Specific Ag, Serum     0.0 - 4.0 ng/mL 8.1 (H) 1.1 1.7 <0.1   Component     Latest Ref Rng & Units 01/07/2021  Prostate Specific Ag, Serum     0.0 - 4.0 ng/mL <0.1    Assessment & Plan:    1. Prostate cancer NED. S/p prostatectomy and IMRT with 6 months ADT. His PSA remains undetectable. Continue PSA q6 months.  2. ED Increased sildenafil to 100 mg.  3. Hot flashes Patient reassured that these should decrease his frequency and severity as time goes on his testosterone continues to rise.  Offered to check a testosterone today, declined his son not likely change management.  We discussed various herbal treatment options including black cohosh, plant estrogens, etc.  He was given a handout about these.  He is not interested in SSRIs.  He will let us know if he fails to improve.  Follow Up:  Return for PSA only in 78mo, 1year w/PSA prior.   Hollice Espy, MD   I, Ardyth Gal, am acting as a scribe for Dr. Hollice Espy.   [facility] [address] [place], [place] [address] [phone]

## 2021-01-08 ENCOUNTER — Ambulatory Visit (INDEPENDENT_AMBULATORY_CARE_PROVIDER_SITE_OTHER): Payer: Medicare Other | Admitting: Urology

## 2021-01-08 ENCOUNTER — Encounter: Payer: Self-pay | Admitting: Urology

## 2021-01-08 VITALS — BP 122/79 | HR 92 | Ht 66.0 in | Wt 166.0 lb

## 2021-01-08 DIAGNOSIS — N529 Male erectile dysfunction, unspecified: Secondary | ICD-10-CM | POA: Diagnosis not present

## 2021-01-08 DIAGNOSIS — I6523 Occlusion and stenosis of bilateral carotid arteries: Secondary | ICD-10-CM

## 2021-01-08 DIAGNOSIS — C61 Malignant neoplasm of prostate: Secondary | ICD-10-CM

## 2021-01-08 LAB — PSA: Prostate Specific Ag, Serum: <0.1 ng/mL (ref 0.0–4.0)

## 2021-01-08 MED ORDER — SILDENAFIL CITRATE 20 MG PO TABS: 20.0000 mg | ORAL_TABLET | ORAL | 11 refills | Status: DC | PRN

## 2021-01-08 NOTE — Patient Instructions (Signed)
Dietary supplements People often assume that "natural" products cause no harm. However, all supplements may have potentially harmful side effects, and supplements can also interact with medications you're taking for other medical conditions. Always review what you're taking with your doctor.  Dietary supplements commonly considered for menopause symptoms include:  Plant estrogens. Asian women, who consume soy regularly, are less likely to report hot flashes and other menopausal symptoms than are women in other parts of the world. One reason might be related to the estrogen-like compounds in soy.  However, studies have generally found little or no benefit with plant estrogens, although research is ongoing to determine whether specific components of soy, such as genistein, help hot flashes.  Black cohosh. Black cohosh has been popular among many women with menopausal symptoms. Studies of black cohosh's effectiveness have had mixed results, and the supplement might be harmful to the liver in rare circumstances. Ginseng. While ginseng may help with mood symptoms and insomnia, it doesn't appear to reduce hot flashes. Dong quai. Study results indicate that dong quai isn't effective for hot flashes. The supplement can increase the effectiveness of blood-thinning medications, which can cause bleeding problems. Vitamin E. Taking a vitamin E supplement might offer some relief from mild hot flashes. In high doses, it can increase your risk of bleeding.

## 2021-01-09 ENCOUNTER — Ambulatory Visit: Payer: Self-pay | Admitting: Urology

## 2021-02-20 ENCOUNTER — Encounter: Payer: Self-pay | Admitting: Cardiology

## 2021-02-20 ENCOUNTER — Other Ambulatory Visit: Payer: Self-pay

## 2021-02-20 ENCOUNTER — Ambulatory Visit (INDEPENDENT_AMBULATORY_CARE_PROVIDER_SITE_OTHER): Payer: Medicare Other | Admitting: Cardiology

## 2021-02-20 VITALS — BP 160/86 | HR 77 | Ht 66.0 in | Wt 165.0 lb

## 2021-02-20 DIAGNOSIS — I2510 Atherosclerotic heart disease of native coronary artery without angina pectoris: Secondary | ICD-10-CM

## 2021-02-20 DIAGNOSIS — R072 Precordial pain: Secondary | ICD-10-CM

## 2021-02-20 DIAGNOSIS — F17200 Nicotine dependence, unspecified, uncomplicated: Secondary | ICD-10-CM

## 2021-02-20 DIAGNOSIS — E7800 Pure hypercholesterolemia, unspecified: Secondary | ICD-10-CM | POA: Diagnosis not present

## 2021-02-20 DIAGNOSIS — I10 Essential (primary) hypertension: Secondary | ICD-10-CM

## 2021-02-20 NOTE — Patient Instructions (Signed)
Medication Instructions:  Your physician recommends that you continue on your current medications as directed. Please refer to the Current Medication list given to you today. *If you need a refill on your cardiac medications before your next appointment, please call your pharmacy*   Lab Work:  Your physician recommends that you return for a FASTING lipid profile: at your convenience before your next follow up.  - You will need to be fasting. Please do not have anything to eat or drink after midnight the morning you have the lab work. You may only have water or black coffee with no cream or sugar. - You will need to be fasting. Please do not have anything to eat or drink after midnight the morning you have the lab work. You may only have water or black coffee with no cream or sugar.    Testing/Procedures:  1.  Your physician has requested that you have an echocardiogram. Echocardiography is a painless test that uses sound waves to create images of your heart. It provides your doctor with information about the size and shape of your heart and how well your heart's chambers and valves are working. This procedure takes approximately one hour. There are no restrictions for this procedure.  2.  [place]       Your caregiver has ordered a Stress Test with nuclear imaging. The purpose of this test is to evaluate the blood supply to your heart muscle. This procedure is referred to as a "Non-Invasive Stress Test." This is because other than having an IV started in your vein, nothing is inserted or "invades" your body. Cardiac stress tests are done to find areas of poor blood flow to the heart by determining the extent of coronary artery disease (CAD). Some patients exercise on a treadmill, which naturally increases the blood flow to your heart, while others who are  unable to walk on a treadmill due to physical limitations have a pharmacologic/chemical stress agent called Lexiscan . This medicine  will mimic walking on a treadmill by temporarily increasing your coronary blood flow.      PLEASE REPORT TO [facility] MEDICAL MALL ENTRANCE   THE VOLUNTEERS AT THE FIRST DESK WILL DIRECT YOU WHERE TO GO     *Please note: these test may take anywhere between 2-4 hours to complete       Date of Procedure:_____________________________________   Arrival Time for Procedure:______________________________    PLEASE NOTIFY THE OFFICE AT LEAST 24 HOURS IN ADVANCE IF YOU ARE UNABLE TO KEEP YOUR APPOINTMENT.  [place] 24 HOURS IN ADVANCE IF YOU ARE UNABLE TO KEEP YOUR APPOINTMENT. [phone]         How to prepare for your Myoview test:         _XX___:  Hold diabetes medication the morning of procedure: metFORMIN (GLUCOPHAGE)    1. Do not eat or drink after midnight  2. No caffeine for 24 hours prior to test  3. No smoking 24 hours prior to test.  4. Unless instructed otherwise, Take your medication with a small sips of water.    5.         Ladies, please do not wear dresses. Skirts or pants are appropriate. Please wear a short sleeve shirt.  6. No perfume, cologne or lotion.  7. Wear comfortable walking shoes. No heels!       Follow-Up: At [facility], you and your health needs are our priority.  As part  of our continuing mission to provide you with exceptional heart care, we have created designated Provider Care Teams.  These Care Teams include your primary Cardiologist (physician) and Advanced Practice Providers (APPs -  Physician Assistants and Nurse Practitioners) who all work together to provide you with the care you need, when you need it.  We recommend signing up for the patient portal called "MyChart".  Sign up information is provided on this After Visit Summary.  MyChart is used to connect with patients for Virtual Visits (Telemedicine).  Patients are able to view lab/test results, encounter notes, upcoming appointments,  etc.  Non-urgent messages can be sent to your provider as well.   To learn more about what you can do with MyChart, go to [email or web address].    Your next appointment:   Follow up after testing   The format for your next appointment:   In Person  Provider:   You may see Kate Sable, MD or one of the following Advanced Practice Providers on your designated Care Team:    Murray Hodgkins, NP  Christell Faith, PA-C  Marrianne Mood, PA-C  Cadence [place], [place]  Laurann Montana, NP    Other Instructions

## 2021-02-20 NOTE — Progress Notes (Signed)
Cardiology Office Note:    Date:  02/20/2021   ID:  Allen Massey, DOB May 07, 1951, MRN [id number]  PCP:  [facility] [place] [facility] Cardiologist:  Kate Sable, MD     Referring MD: Marguerita Merles, MD   Chief Complaint  Patient presents with  . New Patient (Initial Visit)    Referred by PCP for CAD. Patient c/o leg pain. Meds reviewed verbally with patient.     History of Present Illness:    Allen Massey is a 70 y.o. male with a hx of hypertension, hyperlipidemia, diabetes, current smoker x40+ years who presents for coronary artery disease evaluation.  Patient is a current smoker, underwent CT chest lung cancer screening scan on 11/14/2020  which showed three-vessel coronary calcification/atherosclerosis.  Patient states having chest pain with exertion over the past 5 months after he had colon surgery for cancer in January 2022.  Rates pain as 3 out of 10 in severity.  Also gets short of breath when he exerts himself.  He is a current smoker, working on quitting smoking.  Denies any family history of heart disease.  Past Medical History:  Diagnosis Date  . 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 Diabetes mellitus without complication ([place])   . Hyperlipidemia   . Hypertension   . Stroke [facility]) 1990'S   MINI STROKE    Past Surgical History:  Procedure Laterality Date  . Colon cancer removal  10/30/2019  . NO PAST SURGERIES    . PELVIC LYMPH NODE DISSECTION Bilateral 10/30/2019   Procedure: PELVIC LYMPH NODE DISSECTION;  Surgeon: Hollice Espy, MD;  Location: [facility] ORS;  Service: Urology;  Laterality: Bilateral;  . ROBOT ASSISTED LAPAROSCOPIC RADICAL PROSTATECTOMY N/A 10/30/2019   Procedure: XI ROBOTIC ASSISTED LAPAROSCOPIC RADICAL PROSTATECTOMY;  Surgeon: Hollice Espy, MD;  Location: [facility] ORS;  Service: Urology;  Laterality: N/A;  . UMBILICAL HERNIA REPAIR N/A 10/30/2019   Procedure: HERNIA REPAIR UMBILICAL ADULT;  Surgeon: Hollice Espy, MD;   Location: [facility] ORS;  Service: Urology;  Laterality: N/A;    Current Medications: Current Meds  Medication Sig  . aspirin EC 81 MG tablet Take 81 mg by mouth daily.  Marland Kitchen atorvastatin (LIPITOR) 40 MG tablet Take 40 mg by mouth at bedtime.   . brimonidine (ALPHAGAN) 0.2 % ophthalmic solution Place 1 drop into both eyes 3 (three) times daily.  Marland Kitchen docusate sodium (COLACE) 100 MG capsule Take 1 capsule (100 mg total) by mouth 2 (two) times daily.  . dorzolamide-timolol (COSOPT) 22.3-6.8 MG/ML ophthalmic solution Place 1 drop into both eyes 2 (two) times daily.   Marland Kitchen latanoprost (XALATAN) 0.005 % ophthalmic solution Place 1 drop into both eyes at bedtime.   Marland Kitchen losartan (COZAAR) 25 MG tablet Take 25 mg by mouth every morning.   . metFORMIN (GLUCOPHAGE) 1000 MG tablet Take 1,000 mg by mouth 2 (two) times daily.   . naproxen (NAPROSYN) 500 MG tablet Take 1 tablet (500 mg total) by mouth 2 (two) times daily with a meal.  . sildenafil (REVATIO) 20 MG tablet Take 1 tablet (20 mg total) by mouth as needed. Take 1-5 tabs as needed prior to intercourse     Allergies:   Patient has no known allergies.   Social History   Socioeconomic History  . Marital status: Married    Spouse name: Not on file  . Number of children: Not on file  . Years of education: Not on file  . Highest education level: Not on file  Occupational History  . Not on file  Tobacco Use  . Smoking status: Current Some Day Smoker    Packs/day: 0.25    Years: 48.00    Pack years: 12.00    Types: Cigarettes  . Smokeless tobacco: Never Used  Vaping Use  . Vaping Use: Never used  Substance and Sexual Activity  . Alcohol use: Yes    Comment: OCC  . Drug use: No  . Sexual activity: Not on file  Other Topics Concern  . Not on file  Social History Narrative  . Not on file   Social Determinants of Health   Financial Resource Strain: Not on file  Food Insecurity: Not on file  Transportation Needs: Not on file  Physical Activity:  Not on file  Stress: Not on file  Social Connections: Not on file     Family History: The patient's family history includes Diabetes in his brother; Kidney disease in his brother.  ROS:   Please see the history of present illness.     All other systems reviewed and are negative.  EKGs/Labs/Other Studies Reviewed:    The following studies were reviewed today:   EKG:  EKG is  ordered today.  The ekg ordered today demonstrates normal sinus rhythm, nonspecific T wave changes.  Recent Labs: 04/21/2020: ALT 16; BUN 20; Creatinine, Ser 1.24; Potassium 4.2; Sodium 141 10/02/2020: Hemoglobin 11.9; Platelets 191  Recent Lipid Panel    Component Value Date/Time   CHOL 162 05/11/2014 0507   TRIG 98 05/11/2014 0507   HDL 37 (L) 05/11/2014 0507   VLDL 20 05/11/2014 0507   LDLCALC 105 (H) 05/11/2014 0507     Risk Assessment/Calculations:      Physical Exam:    VS:  BP (!) 160/86 (BP Location: Left Arm, Patient Position: Sitting, Cuff Size: Normal)   Pulse 77   Ht 5\' 6"  (1.676 m)   Wt 165 lb (74.8 kg)   SpO2 99%   BMI 26.63 kg/m     Wt Readings from Last 3 Encounters:  02/20/21 165 lb (74.8 kg)  01/08/21 166 lb (75.3 kg)  12/25/20 162 lb 9.6 oz (73.8 kg)     GEN:  Well nourished, well developed in no acute distress HEENT: Normal NECK: No JVD; No carotid bruits LYMPHATICS: No lymphadenopathy CARDIAC: RRR, no murmurs, rubs, gallops RESPIRATORY: Diminished breath sounds, no wheezing ABDOMEN: Soft, non-tender, non-distended MUSCULOSKELETAL:  No edema; No deformity  SKIN: Warm and dry NEUROLOGIC:  Alert and oriented x 3 PSYCHIATRIC:  Normal affect   ASSESSMENT:    1. Coronary artery disease involving native coronary artery of native heart, unspecified whether angina present   2. Primary hypertension   3. Pure hypercholesterolemia   4. Smoking   5. Precordial pain    PLAN:    In order of problems listed above:  1. CAD, three-vessel coronary artery calcification,  endorses chest pain consistent with angina.  Risk factors include current smoker, hypertension, hyperlipidemia, diabetes.  Get echocardiogram to evaluate cardiac function.  Obtain Lexiscan Myoview to evaluate presence of ischemia.  Continue aspirin 81 mg, Lipitor 40 mg daily.  Obtain fasting lipid profile, goal LDL less than 70. 2. Hypertension, BP elevated today, uncontrolled when checked last month.  Continue losartan at current dose.  If stays elevated at follow-up visit, plan to increase losartan. 3. Hyperlipidemia, continue Lipitor.  Update fasting lipid profile. 4. Current smoker, cessation advised.  Follow-up after echocardiogram, Myoview and fasting lipid profile.   Shared Decision Making/Informed Consent  The risks [chest pain, shortness of breath, cardiac arrhythmias, dizziness, blood pressure fluctuations, myocardial infarction, stroke/transient ischemic attack, nausea, vomiting, allergic reaction, radiation exposure, metallic taste sensation and life-threatening complications (estimated to be 1 in 10,000)], benefits (risk stratification, diagnosing coronary artery disease, treatment guidance) and alternatives of a nuclear stress test were discussed in detail with Allen Massey and he agrees to proceed.    Medication Adjustments/Labs and Tests Ordered: Current medicines are reviewed at length with the patient today.  Concerns regarding medicines are outlined above.  Orders Placed This Encounter  Procedures  . NM Myocar Multi W/Spect W/Wall Motion / EF  . Lipid panel  . EKG 12-Lead  . ECHOCARDIOGRAM COMPLETE   No orders of the defined types were placed in this encounter.   Patient Instructions  Medication Instructions:  Your physician recommends that you continue on your current medications as directed. Please refer to the Current Medication list given to you today. *If you need a refill on your cardiac medications before your next appointment, please call your pharmacy*   Lab  Work:  Your physician recommends that you return for a FASTING lipid profile: at your convenience before your next follow up.  - You will need to be fasting. Please do not have anything to eat or drink after midnight the morning you have the lab work. You may only have water or black coffee with no cream or sugar. - You will need to be fasting. Please do not have anything to eat or drink after midnight the morning you have the lab work. You may only have water or black coffee with no cream or sugar.    Testing/Procedures:  1.  Your physician has requested that you have an echocardiogram. Echocardiography is a painless test that uses sound waves to create images of your heart. It provides your doctor with information about the size and shape of your heart and how well your heart's chambers and valves are working. This procedure takes approximately one hour. There are no restrictions for this procedure.  2.  [place]       Your caregiver has ordered a Stress Test with nuclear imaging. The purpose of this test is to evaluate the blood supply to your heart muscle. This procedure is referred to as a "Non-Invasive Stress Test." This is because other than having an IV started in your vein, nothing is inserted or "invades" your body. Cardiac stress tests are done to find areas of poor blood flow to the heart by determining the extent of coronary artery disease (CAD). Some patients exercise on a treadmill, which naturally increases the blood flow to your heart, while others who are  unable to walk on a treadmill due to physical limitations have a pharmacologic/chemical stress agent called Lexiscan . This medicine will mimic walking on a treadmill by temporarily increasing your coronary blood flow.      PLEASE REPORT TO [facility] MEDICAL MALL ENTRANCE   THE VOLUNTEERS AT THE FIRST DESK WILL DIRECT YOU WHERE TO GO     *Please note: these test may take anywhere between 2-4 hours to complete       Date  of Procedure:_____________________________________   Arrival Time for Procedure:______________________________    PLEASE NOTIFY THE OFFICE AT LEAST 24 HOURS IN ADVANCE IF YOU ARE UNABLE TO KEEP YOUR APPOINTMENT.  [place] 24 HOURS IN ADVANCE IF YOU ARE UNABLE TO KEEP YOUR APPOINTMENT. [phone]  How to prepare for your Myoview test:         _XX___:  Hold diabetes medication the morning of procedure: metFORMIN (GLUCOPHAGE)    1. Do not eat or drink after midnight  2. No caffeine for 24 hours prior to test  3. No smoking 24 hours prior to test.  4. Unless instructed otherwise, Take your medication with a small sips of water.    5.         Ladies, please do not wear dresses. Skirts or pants are appropriate. Please wear a short sleeve shirt.  6. No perfume, cologne or lotion.  7. Wear comfortable walking shoes. No heels!       Follow-Up: At [facility], you and your health needs are our priority.  As part of our continuing mission to provide you with exceptional heart care, we have created designated Provider Care Teams.  These Care Teams include your primary Cardiologist (physician) and Advanced Practice Providers (APPs -  Physician Assistants and Nurse Practitioners) who all work together to provide you with the care you need, when you need it.  We recommend signing up for the patient portal called "MyChart".  Sign up information is provided on this After Visit Summary.  MyChart is used to connect with patients for Virtual Visits (Telemedicine).  Patients are able to view lab/test results, encounter notes, upcoming appointments, etc.  Non-urgent messages can be sent to your provider as well.   To learn more about what you can do with MyChart, go to [email or web address].    Your next appointment:   Follow up after testing   The format for your next appointment:   In Person  Provider:   You may see Kate Sable, MD or one of the following Advanced Practice Providers on your designated Care Team:    Murray Hodgkins, NP  Christell Faith, PA-C  Marrianne Mood, PA-C  Cadence [place], [place]  Laurann Montana, NP    Other Instructions      Signed, Kate Sable, MD  02/20/2021 12:32 PM    [place]

## 2021-02-25 ENCOUNTER — Other Ambulatory Visit: Payer: Medicare Other

## 2021-02-25 ENCOUNTER — Other Ambulatory Visit: Payer: Self-pay

## 2021-02-25 ENCOUNTER — Emergency Department: Payer: Medicare Other

## 2021-02-25 ENCOUNTER — Emergency Department
Admission: EM | Admit: 2021-02-25 | Discharge: 2021-02-25 | Disposition: A | Discharge status: Home / Self Care | Payer: Medicare Other | Attending: Emergency Medicine | Admitting: Emergency Medicine

## 2021-02-25 ENCOUNTER — Other Ambulatory Visit
Admission: RE | Admit: 2021-02-25 | Discharge: 2021-02-25 | Disposition: A | Discharge status: Home / Self Care | Payer: Medicare Other | Source: Home / Self Care | Attending: Cardiology | Admitting: Cardiology

## 2021-02-25 DIAGNOSIS — Z7982 Long term (current) use of aspirin: Secondary | ICD-10-CM | POA: Diagnosis not present

## 2021-02-25 DIAGNOSIS — Z8546 Personal history of malignant neoplasm of prostate: Secondary | ICD-10-CM | POA: Diagnosis not present

## 2021-02-25 DIAGNOSIS — N451 Epididymitis: Secondary | ICD-10-CM | POA: Diagnosis not present

## 2021-02-25 DIAGNOSIS — E7800 Pure hypercholesterolemia, unspecified: Secondary | ICD-10-CM | POA: Insufficient documentation

## 2021-02-25 DIAGNOSIS — I10 Essential (primary) hypertension: Secondary | ICD-10-CM | POA: Diagnosis not present

## 2021-02-25 DIAGNOSIS — N5082 Scrotal pain: Secondary | ICD-10-CM | POA: Diagnosis present

## 2021-02-25 DIAGNOSIS — R1030 Lower abdominal pain, unspecified: Secondary | ICD-10-CM

## 2021-02-25 DIAGNOSIS — Z79899 Other long term (current) drug therapy: Secondary | ICD-10-CM | POA: Diagnosis not present

## 2021-02-25 DIAGNOSIS — F17210 Nicotine dependence, cigarettes, uncomplicated: Secondary | ICD-10-CM | POA: Diagnosis not present

## 2021-02-25 DIAGNOSIS — Z7984 Long term (current) use of oral hypoglycemic drugs: Secondary | ICD-10-CM | POA: Insufficient documentation

## 2021-02-25 DIAGNOSIS — N50819 Testicular pain, unspecified: Secondary | ICD-10-CM

## 2021-02-25 DIAGNOSIS — E119 Type 2 diabetes mellitus without complications: Secondary | ICD-10-CM | POA: Diagnosis not present

## 2021-02-25 LAB — URINALYSIS, COMPLETE (UACMP) WITH MICROSCOPIC
Bacteria, UA: NONE SEEN
Bilirubin Urine: NEGATIVE
Glucose, UA: >=500 mg/dL — AB
Ketones, ur: NEGATIVE mg/dL
Leukocytes,Ua: NEGATIVE
Nitrite: NEGATIVE
Protein, ur: 100 mg/dL — AB
Specific Gravity, Urine: 1.017 (ref 1.005–1.030)
pH: 5 (ref 5.0–8.0)

## 2021-02-25 LAB — LIPID PANEL
Cholesterol: 284 mg/dL — ABNORMAL HIGH (ref 0–200)
HDL: 51 mg/dL (ref >40)
LDL Cholesterol: 191 mg/dL — ABNORMAL HIGH (ref 0–99)
Total CHOL/HDL Ratio: 5.6 RATIO
Triglycerides: 208 mg/dL — ABNORMAL HIGH (ref <150)
VLDL: 42 mg/dL — ABNORMAL HIGH (ref 0–40)

## 2021-02-25 LAB — CHLAMYDIA/NGC RT PCR (ARMC ONLY)
Chlamydia Tr: NOT DETECTED
N gonorrhoeae: NOT DETECTED

## 2021-02-25 MED ORDER — DOXYCYCLINE HYCLATE 100 MG PO TABS: 100.0000 mg | ORAL_TABLET | Freq: Two times a day (BID) | ORAL | 0 refills | Status: DC

## 2021-02-25 MED ORDER — CEFTRIAXONE SODIUM 1 G IJ SOLR
1.0000 g | Freq: Once | INTRAMUSCULAR | Status: AC
  Administered 2021-02-25: 1 g via INTRAMUSCULAR
  Filled 2021-02-25: qty 10

## 2021-02-25 NOTE — ED Notes (Signed)
See triage note  Presents with pain to groin area for about 1 week  States no swelling or discharge  But states having a hard time when he tires to void

## 2021-02-25 NOTE — ED Triage Notes (Addendum)
Pt comes with c/o groin pain. Pt states this started week ago. Pt denies any swelling, discharge. Pt states pain.  Pt states pain when urinating.

## 2021-02-25 NOTE — ED Provider Notes (Signed)
[facility] Emergency Department Provider Note  ____________________________________________  Time seen: Approximately 8:01 PM  I have reviewed the triage vital signs and the nursing notes.   HISTORY  Chief Complaint Groin Pain    HPI Allen Massey is a 70 y.o. male who presents the emergency department complaining of scrotal pain.  He states that he is having scrotal/testicular pain worse on the right than left but bilateral symptoms.  He states that occasionally he will have a little bit of dysuria but no hematuria.  He denies any perineal pain, abdominal or suprapubic pain.  No flank pain.   Patient denies any fevers or chills.  No penile drainage.        Past Medical History:  Diagnosis Date  . Cancer ([place])    PROSTATE  . Diabetes mellitus without complication ([place])   . Hyperlipidemia   . Hypertension   . Stroke [facility]) 1990'S   MINI STROKE    Patient Active Problem List   Diagnosis Date Noted  . Prostate cancer ([place]) 10/30/2019  . Carotid stenosis 05/15/2019  . 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 Essential hypertension 05/15/2019  . Diabetes ([place]) 05/15/2019    Past Surgical History:  Procedure Laterality Date  . Colon cancer removal  10/30/2019  . NO PAST SURGERIES    . PELVIC LYMPH NODE DISSECTION Bilateral 10/30/2019   Procedure: PELVIC LYMPH NODE DISSECTION;  Surgeon: Hollice Espy, MD;  Location: [facility] ORS;  Service: Urology;  Laterality: Bilateral;  . ROBOT ASSISTED LAPAROSCOPIC RADICAL PROSTATECTOMY N/A 10/30/2019   Procedure: XI ROBOTIC ASSISTED LAPAROSCOPIC RADICAL PROSTATECTOMY;  Surgeon: Hollice Espy, MD;  Location: [facility] ORS;  Service: Urology;  Laterality: N/A;  . UMBILICAL HERNIA REPAIR N/A 10/30/2019   Procedure: HERNIA REPAIR UMBILICAL ADULT;  Surgeon: Hollice Espy, MD;  Location: [facility] ORS;  Service: Urology;  Laterality: N/A;    Prior to Admission medications    Medication Sig Start Date End Date Taking? Authorizing Provider  doxycycline (VIBRA-TABS) 100 MG tablet Take 1 tablet (100 mg total) by mouth 2 (two) times daily. 02/25/21  Yes Gerturde Kuba, Charline Bills, PA-C  aspirin EC 81 MG tablet Take 81 mg by mouth daily.    [provider]  atorvastatin (LIPITOR) 40 MG tablet Take 40 mg by mouth at bedtime.  01/31/19   [provider]  brimonidine (ALPHAGAN) 0.2 % ophthalmic solution Place 1 drop into both eyes 3 (three) times daily.    [provider]  docusate sodium (COLACE) 100 MG capsule Take 1 capsule (100 mg total) by mouth 2 (two) times daily. 10/31/19   Vaillancourt, Aldona Bar, PA-C  dorzolamide-timolol (COSOPT) 22.3-6.8 MG/ML ophthalmic solution Place 1 drop into both eyes 2 (two) times daily.  04/25/19   [provider]  latanoprost (XALATAN) 0.005 % ophthalmic solution Place 1 drop into both eyes at bedtime.  04/25/19   [provider]  losartan (COZAAR) 25 MG tablet Take 25 mg by mouth every morning.  01/31/19   [provider]  metFORMIN (GLUCOPHAGE) 1000 MG tablet Take 1,000 mg by mouth 2 (two) times daily.  04/12/19   [provider]  naproxen (NAPROSYN) 500 MG tablet Take 1 tablet (500 mg total) by mouth 2 (two) times daily with a meal. 04/21/20   Hinda Kehr, MD  sildenafil (REVATIO) 20 MG tablet Take 1 tablet (20 mg total) by mouth as needed. Take 1-5 tabs as needed prior to intercourse 01/08/21   Hollice Espy, MD  Allergies Patient has no known allergies.  Family History  Problem Relation Age of Onset  . Diabetes Brother   . Kidney disease Brother     Social History Social History   Tobacco Use  . Smoking status: Current Some Day Smoker    Packs/day: 0.25    Years: 48.00    Pack years: 12.00    Types: Cigarettes  . Smokeless tobacco: Never Used  Vaping Use  . Vaping Use: Never used  Substance Use Topics  . Alcohol use: Yes    Comment: OCC  . Drug use: No      Review of Systems  Constitutional: No fever/chills Eyes: No visual changes. No discharge ENT: No upper respiratory complaints. Cardiovascular: no chest pain. Respiratory: no cough. No SOB. Gastrointestinal: No abdominal pain.  No nausea, no vomiting.  No diarrhea.  No constipation. Genitourinary: Negative for dysuria. No hematuria.  Positive for scrotal/testicular pain bilaterally worse on the right than left Musculoskeletal: Negative for musculoskeletal pain. Skin: Negative for rash, abrasions, lacerations, ecchymosis. Neurological: Negative for headaches, focal weakness or numbness.  10 System ROS otherwise negative.  ____________________________________________   PHYSICAL EXAM:  VITAL SIGNS: ED Triage Vitals  Enc Vitals Group     BP 02/25/21 1842 (!) 163/104     Pulse Rate 02/25/21 1842 78     Resp 02/25/21 1842 18     Temp 02/25/21 1842 98 F (36.7 C)     Temp Source 02/25/21 1842 Oral     SpO2 02/25/21 1842 98 %     Weight 02/25/21 1842 164 lb 14.5 oz (74.8 kg)     Height 02/25/21 1842 5\' 6"  (1.676 m)     Head Circumference --      Peak Flow --      Pain Score 02/25/21 1627 5     Pain Loc --      Pain Edu? --      Excl. in [place]? --      Constitutional: Alert and oriented. Well appearing and in no acute distress. Eyes: Conjunctivae are normal. PERRL. EOMI. Head: Atraumatic. ENT:      Ears:       Nose: No congestion/rhinnorhea.      Mouth/Throat: Mucous membranes are moist.  Neck: No stridor.    Cardiovascular: Normal rate, regular rhythm. Normal S1 and S2.  Good peripheral circulation. Respiratory: Normal respiratory effort without tachypnea or retractions. Lungs CTAB. Good air entry to the bases with no decreased or absent breath sounds. Gastrointestinal: Bowel sounds 4 quadrants. Soft and nontender to palpation. No guarding or rigidity. No palpable masses. No distention. No CVA tenderness. Genitourinary: Patient declines genital exam at this  time Musculoskeletal: Full range of motion to all extremities. No gross deformities appreciated. Neurologic:  Normal speech and language. No gross focal neurologic deficits are appreciated.  Skin:  Skin is warm, dry and intact. No rash noted. Psychiatric: Mood and affect are normal. Speech and behavior are normal. Patient exhibits appropriate insight and judgement.   ____________________________________________   LABS (all labs ordered are listed, but only abnormal results are displayed)  Labs Reviewed  URINALYSIS, COMPLETE (UACMP) WITH MICROSCOPIC - Abnormal; Notable for the following components:      Result Value   Color, Urine YELLOW (*)    APPearance HAZY (*)    Glucose, UA >=500 (*)    Hgb urine dipstick MODERATE (*)    Protein, ur 100 (*)    All other components within normal limits  CHLAMYDIA/NGC RT PCR (  [place])  URINE CULTURE   ____________________________________________  EKG   ____________________________________________  RADIOLOGY I personally viewed and evaluated these images as part of my medical decision making, as well as reviewing the written report by the radiologist.  ED Provider Interpretation: Findings on ultrasound consistent with epididymitis with small right-sided hydrocele.  US SCROTUM W/DOPPLER  Result Date: 02/25/2021 CLINICAL DATA:  Right inguinal and right testicular pain EXAM: SCROTAL ULTRASOUND DOPPLER ULTRASOUND OF THE TESTICLES TECHNIQUE: Complete ultrasound examination of the testicles, epididymis, and other scrotal structures was performed. Color and spectral Doppler ultrasound were also utilized to evaluate blood flow to the testicles. COMPARISON:  None. FINDINGS: Right testicle Measurements: 3.2 x 3.3 x 2.1 cm. No mass or microlithiasis visualized. Normal color flow vascularity. Left testicle Measurements: 3.9 x 2.0 x 2.1 cm. No mass or microlithiasis visualized. Normal color flow vascularity. Right epididymis: The right  epididymis is thickened, heterogeneous, and hypervascular in keeping with changes of acute epididymitis. Left epididymis: The left epididymis demonstrates similar, though less severe changes involving the tail of the epididymis in keeping with early, acute epididymitis. Hydrocele: Small right hydrocele is present, likely reactive in nature. Varicocele:  None visualized. Pulsed Doppler interrogation of both testes demonstrates blunted arterial waveform within the right testicle is likely artifactual in nature, though this is present. There is normal vascularity of the left testicle. IMPRESSION: Thickening and heterogeneity of the testicles bilaterally, right greater than left, in keeping with changes of acute epididymitis. Small reactive right hydrocele. Blunted arterial waveform the right testicle, likely artifactual in nature though vascularity of the testicle can be compromised in the setting of extreme inflammation. Given the normal color flow appearance and parenchymal echotexture of the testicle, however, I suspect this is artifactual. Electronically Signed   By: Fidela Salisbury MD   On: 02/25/2021 18:17    ____________________________________________    PROCEDURES  Procedure(s) performed:    Procedures    Medications  cefTRIAXone (ROCEPHIN) injection 1 g (1 g Intramuscular Given 02/25/21 2036)     ____________________________________________   INITIAL IMPRESSION / ASSESSMENT AND PLAN / ED COURSE  Pertinent labs & imaging results that were available during my care of the patient were reviewed by me and considered in my medical decision making (see chart for details).  Review of the [place] CSRS was performed in accordance of the [place] prior to dispensing any controlled drugs.           Patient's diagnosis is consistent with epididymitis.  Patient presented to emergency department complaining of testicular/scrotal pain.  Patient was having some intermittent dysuria but no penile  discharge.  He denies any concern for STDs.  Patient had findings on ultrasound consistent with epididymitis.  Urinalysis is overall reassuring and urine culture as well as gonorrhea and Chlamydia are currently running.  Patient has received Rocephin here, will be prescribed doxycycline at home..  Follow-up with primary care as needed.  Return precautions discussed with the patient.  Patient is given ED precautions to return to the ED for any worsening or new symptoms.     ____________________________________________  FINAL CLINICAL IMPRESSION(S) / ED DIAGNOSES  Final diagnoses:  Epididymitis      NEW MEDICATIONS STARTED DURING THIS VISIT:  ED Discharge Orders         Ordered    doxycycline (VIBRA-TABS) 100 MG tablet  2 times daily        02/25/21 2134              This chart was  dictated using voice recognition software/Dragon. Despite best efforts to proofread, errors can occur which can change the meaning. Any change was purely unintentional.    Darletta Moll, PA-C 02/25/21 2136    Arta Silence, MD 02/25/21 2337

## 2021-02-26 ENCOUNTER — Telehealth: Payer: Self-pay

## 2021-02-26 MED ORDER — ATORVASTATIN CALCIUM 80 MG PO TABS: 80.0000 mg | ORAL_TABLET | Freq: Every day | ORAL | 3 refills | Status: AC

## 2021-02-26 NOTE — Telephone Encounter (Signed)
-----   Message from Kate Sable, MD sent at 02/26/2021 11:04 AM EDT ----- Cholesterol elevated, increase Lipitor to 80 mg daily.  Thank you

## 2021-02-26 NOTE — Telephone Encounter (Signed)
Called patient and gave him the result note as seen below. Patient verbalized understanding and agreed with plan.

## 2021-02-27 LAB — URINE CULTURE: Culture: <10000 — AB

## 2021-03-05 ENCOUNTER — Other Ambulatory Visit: Payer: Self-pay

## 2021-03-05 ENCOUNTER — Encounter
Admission: RE | Admit: 2021-03-05 | Discharge: 2021-03-05 | Disposition: A | Discharge status: Home / Self Care | Payer: Medicare Other | Source: Ambulatory Visit | Attending: Cardiology | Admitting: Cardiology

## 2021-03-05 ENCOUNTER — Ambulatory Visit (INDEPENDENT_AMBULATORY_CARE_PROVIDER_SITE_OTHER): Payer: Medicare Other | Admitting: Urology

## 2021-03-05 VITALS — BP 144/84 | HR 103 | Temp 98.1°F

## 2021-03-05 DIAGNOSIS — R072 Precordial pain: Secondary | ICD-10-CM | POA: Insufficient documentation

## 2021-03-05 DIAGNOSIS — N453 Epididymo-orchitis: Secondary | ICD-10-CM | POA: Diagnosis not present

## 2021-03-05 DIAGNOSIS — I6523 Occlusion and stenosis of bilateral carotid arteries: Secondary | ICD-10-CM

## 2021-03-05 DIAGNOSIS — N476 Balanoposthitis: Secondary | ICD-10-CM | POA: Diagnosis not present

## 2021-03-05 LAB — NM MYOCAR MULTI W/SPECT W/WALL MOTION / EF
LV dias vol: 45 mL (ref 62–150)
LV sys vol: 16 mL
MPHR: 150 {beats}/min
Peak HR: 102 {beats}/min
Percent HR: 68 %
Rest HR: 82 {beats}/min
SDS: 0
SRS: 3
SSS: 0
TID: 0.93

## 2021-03-05 MED ORDER — REGADENOSON 0.4 MG/5ML IV SOLN
0.4000 mg | Freq: Once | INTRAVENOUS | Status: AC
  Administered 2021-03-05: 0.4 mg via INTRAVENOUS

## 2021-03-05 MED ORDER — TECHNETIUM TC 99M TETROFOSMIN IV KIT
10.0000 | PACK | Freq: Once | INTRAVENOUS | Status: AC | PRN
  Administered 2021-03-05: 11.11 via INTRAVENOUS

## 2021-03-05 MED ORDER — TECHNETIUM TC 99M TETROFOSMIN IV KIT
30.0000 | PACK | Freq: Once | INTRAVENOUS | Status: AC | PRN
  Administered 2021-03-05: 31.7 via INTRAVENOUS

## 2021-03-05 NOTE — Progress Notes (Signed)
03/05/2021 5:15 PM   Allen Massey Apr 30, 1951 [id number]  Referring provider: [facility] [address],  [place] [address]  Chief Complaint  Patient presents with  . Epididymitis  . Testicle Pain    HPI: 70 year old male with a personal history of prostate cancer who presents today for follow-up of scrotal pain.  He was seen and evaluated in the emergency room on 02/25/2021 with right greater than left scrotal pain.  He underwent scrotal ultrasound on 02/25/2021 with thickening and heterogeneity of the testicles bilaterally, right greater than left which is felt to be likely secondary to epididymoorchitis.  There is also a small reactive right hydrocele.  He was given a dose of ceftriaxone and prescribed doxycycline. \ In the ER, his urine was fairly unremarkable although had greater than 500 glucose as well as 6-10 red blood cells per high-powered field.  Urine culture was negative.  Chlamydia and gonorrhea was also negative.  He was seen by his primary care earlier this week.  His antibiotics were switched from doxycycline to Cipro just yesterday.  He reports that he did not take a dose today.  It is somewhat unclear why this change was made.  He reports that he developed penile swelling starting Monday this week.  Its seems to have worsened.  He was able to pull back his foreskin but now is unable to do so.  He denies any purulent penile discharge.  He denies any recent sexual activity or STI exposures.  He is somewhat of a poor historian today and some of his information was pieced together by reviewing his ER visit as well as medical chart.   PMH: Past Medical History:  Diagnosis Date  . Cancer ([place])    PROSTATE  . Diabetes mellitus without complication ([place])   . Hyperlipidemia   . Hypertension   . Stroke [facility]) 1990'S   MINI STROKE    Surgical History: Past Surgical History:  Procedure Laterality Date  . Colon cancer removal   10/30/2019  . NO PAST SURGERIES    . PELVIC LYMPH NODE DISSECTION Bilateral 10/30/2019   Procedure: PELVIC LYMPH NODE DISSECTION;  Surgeon: Hollice Espy, MD;  Location: [facility] ORS;  Service: Urology;  Laterality: Bilateral;  . ROBOT ASSISTED LAPAROSCOPIC RADICAL PROSTATECTOMY N/A 10/30/2019   Procedure: XI ROBOTIC ASSISTED LAPAROSCOPIC RADICAL PROSTATECTOMY;  Surgeon: Hollice Espy, MD;  Location: [facility] ORS;  Service: Urology;  Laterality: N/A;  . UMBILICAL HERNIA REPAIR N/A 10/30/2019   Procedure: HERNIA REPAIR UMBILICAL ADULT;  Surgeon: Hollice Espy, MD;  Location: [facility] ORS;  Service: Urology;  Laterality: N/A;    Home Medications:  Allergies as of 03/05/2021   No Known Allergies     Medication List       Accurate as of March 05, 2021  5:15 PM. If you have any questions, ask your nurse or doctor.        STOP taking these medications   doxycycline 100 MG tablet Commonly known as: VIBRA-TABS Stopped by: Hollice Espy, MD     TAKE these medications   aspirin EC 81 MG tablet Take 81 mg by mouth daily.   atorvastatin 80 MG tablet Commonly known as: LIPITOR Take 1 tablet (80 mg total) by mouth daily.   brimonidine 0.2 % ophthalmic solution Commonly known as: ALPHAGAN Place 1 drop into both eyes 3 (three) times daily.   Cipro 500 MG tablet Generic drug: ciprofloxacin Take 500 mg by mouth 2 (two) times daily.   docusate sodium  100 MG capsule Commonly known as: COLACE Take 1 capsule (100 mg total) by mouth 2 (two) times daily.   dorzolamide-timolol 22.3-6.8 MG/ML ophthalmic solution Commonly known as: COSOPT Place 1 drop into both eyes 2 (two) times daily.   latanoprost 0.005 % ophthalmic solution Commonly known as: XALATAN Place 1 drop into both eyes at bedtime.   losartan 25 MG tablet Commonly known as: COZAAR Take 25 mg by mouth every morning.   metFORMIN 1000 MG tablet Commonly known as: GLUCOPHAGE Take 1,000 mg by mouth 2 (two) times daily.   naproxen 500  MG tablet Commonly known as: Naprosyn Take 1 tablet (500 mg total) by mouth 2 (two) times daily with a meal.   sildenafil 20 MG tablet Commonly known as: Revatio Take 1 tablet (20 mg total) by mouth as needed. Take 1-5 tabs as needed prior to intercourse       Allergies: No Known Allergies  Family History: Family History  Problem Relation Age of Onset  . Diabetes Brother   . Kidney disease Brother     Social History:  reports that he has been smoking cigarettes. He has a 12.00 pack-year smoking history. He has never used smokeless tobacco. He reports current alcohol use. He reports that he does not use drugs.   Physical Exam: BP (!) 144/84   Pulse (!) 103   Temp 98.1 F (36.7 C)   Constitutional:  Alert and oriented, No acute distress. HEENT: [place] AT, moist mucus membranes.  Trachea midline, no masses. Cardiovascular: No clubbing, cyanosis, or edema. Respiratory: Normal respiratory effort, no increased work of breathing. GU: Uncircumcised phallus with significant edema of the penile foreskin, unable to retract foreskin today to visualize glans.  I was able to palpate the penile shaft which felt palpably normal as well as the glans which also felt normal.  Bilateral testicles were descended, nontender, no scrotal skin or testicular pathology.  He does have what appears to be stress incontinence although he denies this, urine is noted leaking from his penis on the floor during exam today. Skin: No rashes, bruises or suspicious lesions. Neurologic: Grossly intact, no focal deficits, moving all 4 extremities. Psychiatric: Normal mood and affect.  Laboratory Data: Lab Results  Component Value Date   WBC 5.2 10/02/2020   HGB 11.9 (L) 10/02/2020   HCT 34.8 (L) 10/02/2020   MCV 92.8 10/02/2020   PLT 191 10/02/2020    Lab Results  Component Value Date   CREATININE 1.24 04/21/2020    Lab Results  Component Value Date   HGBA1C 7.8 (H) 10/30/2019    Urinalysis Microscopic  blood in urine today, otherwise unremarkable.  Pertinent Imaging:  IMPRESSION: Thickening and heterogeneity of the testicles bilaterally, right greater than left, in keeping with changes of acute epididymitis. Small reactive right hydrocele.  Blunted arterial waveform the right testicle, likely artifactual in nature though vascularity of the testicle can be compromised in the setting of extreme inflammation. Given the normal color flow appearance and parenchymal echotexture of the testicle, however, I suspect this is artifactual.   Electronically Signed   By: Fidela Salisbury MD   On: 02/25/2021 18:17  Scrotal ultrasound images were personally reviewed today.  Agree with radiologic interpretation.  Assessment & Plan:    1. Orchitis and epididymitis Scrotal exam today is benign, this appears to have resolved with antibiotics - Urinalysis, Complete  2. Penile edema Exam today reveals edema primarily in the foreskin which is not able to be retract with significant inflammation.  This likely the source of his microscopic blood in his urine today.  Underlying etiology for this is somewhat unclear.  The penile shaft itself is palpably normal although not able to examine a completely along with the glans due to inability to retract his foreskin.  Question whether or not this may be related to recently reduced paraphimosis versus underlying penile abscess versus cellulitis versus development of bioprosthesis after being on antibiotics.  His penile shaft along with the foreskin were wrapped today in clinic with Kerlix and Coban.  I also asked my PA, Debroah Loop to examine the patient with me today as I would like to see him in close follow-up in 2 days to remove the wrap in order to be able to reduce the foreskin and reevaluate his penile shaft and glans.  Will also plan to repeat his urinalysis at that time.  He is agreeable this plan.  Warning symptoms reviewed.  Will  leave him on Cipro today as this was just recently changed yesterday as not had time to be effective.  If no improvement by Friday, will consider topical steroid versus antifungal cream or switching antibiotics to include more skin flora.  F/u Friday for repeat exam/ close f/u   Hollice Espy, MD  [facility] [address], [place], [place] [address] [phone]

## 2021-03-07 ENCOUNTER — Ambulatory Visit: Payer: Self-pay | Admitting: Physician Assistant

## 2021-03-07 ENCOUNTER — Encounter: Payer: Self-pay | Admitting: Physician Assistant

## 2021-03-07 ENCOUNTER — Ambulatory Visit (INDEPENDENT_AMBULATORY_CARE_PROVIDER_SITE_OTHER): Payer: Medicare Other | Admitting: Physician Assistant

## 2021-03-07 ENCOUNTER — Other Ambulatory Visit: Payer: Self-pay

## 2021-03-07 VITALS — BP 117/74 | HR 106 | Ht 66.0 in | Wt 158.6 lb

## 2021-03-07 DIAGNOSIS — N4889 Other specified disorders of penis: Secondary | ICD-10-CM

## 2021-03-07 LAB — URINALYSIS, COMPLETE
Bilirubin, UA: NEGATIVE
Bilirubin, UA: NEGATIVE
Glucose, UA: NEGATIVE
Ketones, UA: NEGATIVE
Leukocytes,UA: NEGATIVE
Nitrite, UA: NEGATIVE
Nitrite, UA: NEGATIVE
Specific Gravity, UA: 1.01 (ref 1.005–1.030)
Specific Gravity, UA: >1.03 — ABNORMAL HIGH (ref 1.005–1.030)
Urobilinogen, Ur: 0.2 mg/dL (ref 0.2–1.0)
Urobilinogen, Ur: 0.2 mg/dL (ref 0.2–1.0)
pH, UA: 5 (ref 5.0–7.5)
pH, UA: 5 (ref 5.0–7.5)

## 2021-03-07 LAB — MICROSCOPIC EXAMINATION
Bacteria, UA: NONE SEEN
Bacteria, UA: NONE SEEN

## 2021-03-07 MED ORDER — NYSTATIN-TRIAMCINOLONE 100000-0.1 UNIT/GM-% EX OINT: 1.0000 "application " | TOPICAL_OINTMENT | Freq: Two times a day (BID) | CUTANEOUS | 0 refills | Status: DC

## 2021-03-07 NOTE — Patient Instructions (Signed)
1. Continue your ciprofloxacin antibiotic. 2. Go pick up the new prescription I sent to [facility] today. This is a cream. I want you to apply the cream two times per day on your foreskin. I want you to try to put the cream at the tip of your penis as best you can. 3. While you are at [facility], buy a jock strap and wear this instead of underwear for the next few days. 4. Dr. Erlene Quan will see you back in clinic early next week to check your penile swelling again.

## 2021-03-07 NOTE — Progress Notes (Signed)
03/07/2021 10:10 AM   Allen Massey May 31, 1951 [id number]  CC: Chief Complaint  Patient presents with  . Follow-up    HPI: Allen Massey is a 70 y.o. male with PMH prostate cancer s/p prostatectomy, ADT, and salvage radiation; ED; and recent epididymoorchitis and penile/foreskin swelling and pain who presents today for penile recheck. He saw Dr. Erlene Quan in clinic two days ago for evaluation of his penile swelling, at which point he was encouraged to continue Cipro and his penis was wrapped with Kerlix and Coban to reduce swelling.  Today he reports his penile bandage was itching him, so he removed it. He describes stable, 5/10 pain localized to the left foreskin. He continues to void spontaneously without difficulty. He is still taking Cipro.  In-office repeat UA today positive for trace ketones, 1+ blood, 2+ protein, and 1+ leukocyte esterase; urine microscopy with 11-30 WBCs/HPF and 3-10 RBCs/HPF.  PMH: Past Medical History:  Diagnosis Date  . Cancer ([place])    PROSTATE  . Diabetes mellitus without complication ([place])   . Hyperlipidemia   . Hypertension   . Stroke [facility]) 1990'S   MINI STROKE    Surgical History: Past Surgical History:  Procedure Laterality Date  . Colon cancer removal  10/30/2019  . NO PAST SURGERIES    . PELVIC LYMPH NODE DISSECTION Bilateral 10/30/2019   Procedure: PELVIC LYMPH NODE DISSECTION;  Surgeon: Hollice Espy, MD;  Location: [facility] ORS;  Service: Urology;  Laterality: Bilateral;  . ROBOT ASSISTED LAPAROSCOPIC RADICAL PROSTATECTOMY N/A 10/30/2019   Procedure: XI ROBOTIC ASSISTED LAPAROSCOPIC RADICAL PROSTATECTOMY;  Surgeon: Hollice Espy, MD;  Location: [facility] ORS;  Service: Urology;  Laterality: N/A;  . UMBILICAL HERNIA REPAIR N/A 10/30/2019   Procedure: HERNIA REPAIR UMBILICAL ADULT;  Surgeon: Hollice Espy, MD;  Location: [facility] ORS;  Service: Urology;  Laterality: N/A;    Home Medications:  Allergies as of 03/07/2021   No Known Allergies      Medication List       Accurate as of March 07, 2021 10:10 AM. If you have any questions, ask your nurse or doctor.        STOP taking these medications   docusate sodium 100 MG capsule Commonly known as: COLACE Stopped by: Debroah Loop, PA-C   naproxen 500 MG tablet Commonly known as: Naprosyn Stopped by: Debroah Loop, PA-C     TAKE these medications   aspirin EC 81 MG tablet Take 81 mg by mouth daily.   atorvastatin 80 MG tablet Commonly known as: LIPITOR Take 1 tablet (80 mg total) by mouth daily.   brimonidine 0.2 % ophthalmic solution Commonly known as: ALPHAGAN Place 1 drop into both eyes 3 (three) times daily.   Cipro 500 MG tablet Generic drug: ciprofloxacin Take 500 mg by mouth 2 (two) times daily.   dorzolamide-timolol 22.3-6.8 MG/ML ophthalmic solution Commonly known as: COSOPT Place 1 drop into both eyes 2 (two) times daily.   latanoprost 0.005 % ophthalmic solution Commonly known as: XALATAN Place 1 drop into both eyes at bedtime.   losartan 25 MG tablet Commonly known as: COZAAR Take 25 mg by mouth every morning.   metFORMIN 1000 MG tablet Commonly known as: GLUCOPHAGE Take 1,000 mg by mouth 2 (two) times daily.   nystatin-triamcinolone ointment Commonly known as: MYCOLOG Apply 1 application topically 2 (two) times daily. Started by: Debroah Loop, PA-C   sildenafil 20 MG tablet Commonly known as: Revatio Take 1 tablet (20 mg total) by mouth as needed. Take 1-5 tabs as  needed prior to intercourse       Allergies:  No Known Allergies  Family History: Family History  Problem Relation Age of Onset  . Diabetes Brother   . Kidney disease Brother     Social History:   reports that he has been smoking cigarettes. He has a 12.00 pack-year smoking history. He has never used smokeless tobacco. He reports current alcohol use. He reports that he does not use drugs.  Physical Exam: BP 117/74 (BP Location: Left Arm,  Patient Position: Sitting, Cuff Size: Large)   Pulse (!) 106   Ht 5\' 6"  (1.676 m)   Wt 158 lb 9.6 oz (71.9 kg)   BMI 25.60 kg/m   Constitutional:  Alert and oriented, no acute distress, nontoxic appearing HEENT: [place], AT Cardiovascular: No clubbing, cyanosis, or edema Respiratory: Normal respiratory effort, no increased work of breathing GU: Palpably normal penile shaft with worsening edema of the foreskin. The foreskin is not reducible, and the glans penis is not visible secondary to edematous phimosis. No significant erythema of the penis or foreskin. No purulence, crepitus, or fluctance of the penis or foreskin. Skin: No rashes, bruises or suspicious lesions Neurologic: Grossly intact, no focal deficits, moving all 4 extremities Psychiatric: Normal mood and affect  Laboratory Data: Results for orders placed or performed in visit on 03/07/21  Microscopic Examination   Urine  Result Value Ref Range   WBC, UA 11-30 (A) 0 - 5 /hpf   RBC 3-10 (A) 0 - 2 /hpf   Epithelial Cells (non renal) 0-10 0 - 10 /hpf   Casts Present (A) None seen /lpf   Cast Type Hyaline casts N/A   Bacteria, UA None seen None seen/Few  Urinalysis, Complete  Result Value Ref Range   Specific Gravity, UA >1.030 (H) 1.005 - 1.030   pH, UA 5.0 5.0 - 7.5   Color, UA Yellow Yellow   Appearance Ur Cloudy (A) Clear   Leukocytes,UA 1+ (A) Negative   Protein,UA 2+ (A) Negative/Trace   Glucose, UA Negative Negative   Ketones, UA Trace (A) Negative   RBC, UA 1+ (A) Negative   Bilirubin, UA Negative Negative   Urobilinogen, Ur 0.2 0.2 - 1.0 mg/dL   Nitrite, UA Negative Negative   Microscopic Examination See below:    Assessment & Plan:   1. Penile swelling Worsening edema of the foreskin. Patient was unable to tolerate penile dressing. No evidence of abscess on physical exam today. In consultation with Dr. Erlene Quan, I counseled him to continue Cipro, start Mycolog twice daily, wear a jock strap, and RTC early next week  for recheck. He expressed understanding.  UA remains significant for pyuria and microscopic hematuria, very likely 2/2 urinary contamination with grossly edematous, phimotic foreskin. - Urinalysis, Complete - nystatin-triamcinolone ointment (MYCOLOG); Apply 1 application topically 2 (two) times daily.  Dispense: 30 g; Refill: 0  Return in about 4 days (around 03/11/2021) for Symptom recheck with Dr. Erlene Quan.  Debroah Loop, PA-C  [facility] [address], [place], [place] [address] [phone]

## 2021-03-11 ENCOUNTER — Ambulatory Visit (INDEPENDENT_AMBULATORY_CARE_PROVIDER_SITE_OTHER): Payer: Medicare Other | Admitting: Urology

## 2021-03-11 ENCOUNTER — Other Ambulatory Visit: Payer: Self-pay

## 2021-03-11 ENCOUNTER — Encounter: Payer: Self-pay | Admitting: Urology

## 2021-03-11 VITALS — BP 146/89 | HR 92 | Ht 66.0 in

## 2021-03-11 DIAGNOSIS — N476 Balanoposthitis: Secondary | ICD-10-CM | POA: Diagnosis not present

## 2021-03-11 DIAGNOSIS — I6523 Occlusion and stenosis of bilateral carotid arteries: Secondary | ICD-10-CM

## 2021-03-11 DIAGNOSIS — N4889 Other specified disorders of penis: Secondary | ICD-10-CM | POA: Diagnosis not present

## 2021-03-11 NOTE — Progress Notes (Signed)
03/11/2021 4:54 PM   Allen Massey 02-24-1951 [id number]  Referring provider: [facility], [facility] [place],  [place] [address]  Chief Complaint  Patient presents with  . Follow-up    HPI: 70 year old man who returns today for reevaluation of penile swelling of unclear etiology.  He initially presented to the emergency room on 02/25/2021 complaining of testicular pain and discomfort.  He was prescribed a course of doxycycline which is later changed to Cipro after he developed swelling of his penile foreskin.  He was seen in follow-up Friday without improvement of his foreskin.  He did not tolerate compressive dressing.  He was prescribed triamcinolone/nystatin cream and returns today for reevaluation.  He reports overall generalized improvement in both his penile pain and swelling.  He denies any urinary issues.  PMH: Past Medical History:  Diagnosis Date  . Cancer ([place])    PROSTATE  . Diabetes mellitus without complication ([place])   . Hyperlipidemia   . Hypertension   . Stroke [facility]) 1990'S   MINI STROKE    Surgical History: Past Surgical History:  Procedure Laterality Date  . Colon cancer removal  10/30/2019  . NO PAST SURGERIES    . PELVIC LYMPH NODE DISSECTION Bilateral 10/30/2019   Procedure: PELVIC LYMPH NODE DISSECTION;  Surgeon: Hollice Espy, MD;  Location: [facility] ORS;  Service: Urology;  Laterality: Bilateral;  . ROBOT ASSISTED LAPAROSCOPIC RADICAL PROSTATECTOMY N/A 10/30/2019   Procedure: XI ROBOTIC ASSISTED LAPAROSCOPIC RADICAL PROSTATECTOMY;  Surgeon: Hollice Espy, MD;  Location: [facility] ORS;  Service: Urology;  Laterality: N/A;  . UMBILICAL HERNIA REPAIR N/A 10/30/2019   Procedure: HERNIA REPAIR UMBILICAL ADULT;  Surgeon: Hollice Espy, MD;  Location: [facility] ORS;  Service: Urology;  Laterality: N/A;    Home Medications:  Allergies as of 03/11/2021   No Known Allergies     Medication List       Accurate as of March 11, 2021  4:54  PM. If you have any questions, ask your nurse or doctor.        aspirin EC 81 MG tablet Take 81 mg by mouth daily.   atorvastatin 80 MG tablet Commonly known as: LIPITOR Take 1 tablet (80 mg total) by mouth daily.   brimonidine 0.2 % ophthalmic solution Commonly known as: ALPHAGAN Place 1 drop into both eyes 3 (three) times daily.   Cipro 500 MG tablet Generic drug: ciprofloxacin Take 500 mg by mouth 2 (two) times daily.   dorzolamide-timolol 22.3-6.8 MG/ML ophthalmic solution Commonly known as: COSOPT Place 1 drop into both eyes 2 (two) times daily.   latanoprost 0.005 % ophthalmic solution Commonly known as: XALATAN Place 1 drop into both eyes at bedtime.   losartan 25 MG tablet Commonly known as: COZAAR Take 25 mg by mouth every morning.   metFORMIN 1000 MG tablet Commonly known as: GLUCOPHAGE Take 1,000 mg by mouth 2 (two) times daily.   nystatin-triamcinolone ointment Commonly known as: MYCOLOG Apply 1 application topically 2 (two) times daily.   sildenafil 20 MG tablet Commonly known as: Revatio Take 1 tablet (20 mg total) by mouth as needed. Take 1-5 tabs as needed prior to intercourse       Allergies: No Known Allergies  Family History: Family History  Problem Relation Age of Onset  . Diabetes Brother   . Kidney disease Brother     Social History:  reports that he has been smoking cigarettes. He has a 12.00 pack-year smoking history. He has never used smokeless tobacco. He  reports current alcohol use. He reports that he does not use drugs.   Physical Exam: BP (!) 146/89   Pulse 92   Ht 5\' 6"  (1.676 m)   BMI 25.60 kg/m   Constitutional:  Alert and oriented, No acute distress. HEENT: [place] AT, moist mucus membranes.  Trachea midline, no masses. Cardiovascular: No clubbing, cyanosis, or edema. Respiratory: Normal respiratory effort, no increased work of breathing. GU:.  Significantly decreased amount of edema and foreskin with resolution of  erythema.  Able to now see portion of the glans.  Primary edema is ventrally. Skin: No rashes, bruises or suspicious lesions. Neurologic: Grossly intact, no focal deficits, moving all 4 extremities. Psychiatric: Normal mood and affect.   Assessment & Plan:    1. 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 Balanoposthitis As above   Penile recheck in a month  Hollice Espy, MD  [facility] [address], [place], [place] [address] [phone]

## 2021-03-25 ENCOUNTER — Telehealth: Payer: Self-pay | Admitting: Cardiology

## 2021-03-25 NOTE — Telephone Encounter (Signed)
-----   Message from Janan Ridge, [place] sent at 03/25/2021  1:10 PM EDT ----- Can you please reschedule until after Echo?  ----- Message ----- From: Kate Sable, MD Sent: 03/25/2021  11:28 AM EDT To: Janan Ridge, CMA  Reschedule after echo. ----- Message ----- From: Janan Ridge, CMA Sent: 03/25/2021  11:10 AM EDT To: Kate Sable, MD  Patient has seen you 02/20/2021.  You ordered an Echo and myoview.  Myoview was completed 03/05/2021.  Echo was No showed 02/25/2021  Would you still like to see the patient or would you like to reschedule until after Echo?

## 2021-03-25 NOTE — Telephone Encounter (Signed)
NO answer NO VM to r/s

## 2021-03-31 ENCOUNTER — Ambulatory Visit: Payer: Medicare Other | Admitting: Cardiology

## 2021-04-14 ENCOUNTER — Other Ambulatory Visit: Payer: Self-pay

## 2021-04-14 ENCOUNTER — Ambulatory Visit (INDEPENDENT_AMBULATORY_CARE_PROVIDER_SITE_OTHER): Payer: Medicare Other

## 2021-04-14 ENCOUNTER — Ambulatory Visit: Payer: Medicare Other | Admitting: Radiation Oncology

## 2021-04-14 DIAGNOSIS — I2510 Atherosclerotic heart disease of native coronary artery without angina pectoris: Secondary | ICD-10-CM | POA: Diagnosis not present

## 2021-04-14 LAB — ECHOCARDIOGRAM COMPLETE
Area-P 1/2: 2.6 cm2
Calc EF: 59.6 %
S' Lateral: 2.8 cm
Single Plane A2C EF: 52.2 %
Single Plane A4C EF: 64.4 %

## 2021-04-16 ENCOUNTER — Ambulatory Visit: Payer: Self-pay | Admitting: Urology

## 2021-04-21 ENCOUNTER — Ambulatory Visit (INDEPENDENT_AMBULATORY_CARE_PROVIDER_SITE_OTHER): Payer: Medicare Other | Admitting: Cardiology

## 2021-04-21 ENCOUNTER — Other Ambulatory Visit: Payer: Self-pay

## 2021-04-21 ENCOUNTER — Encounter: Payer: Self-pay | Admitting: Cardiology

## 2021-04-21 VITALS — BP 114/66 | HR 80 | Ht 63.0 in | Wt 160.0 lb

## 2021-04-21 DIAGNOSIS — F17200 Nicotine dependence, unspecified, uncomplicated: Secondary | ICD-10-CM

## 2021-04-21 DIAGNOSIS — I10 Essential (primary) hypertension: Secondary | ICD-10-CM

## 2021-04-21 DIAGNOSIS — E7800 Pure hypercholesterolemia, unspecified: Secondary | ICD-10-CM | POA: Diagnosis not present

## 2021-04-21 DIAGNOSIS — I2510 Atherosclerotic heart disease of native coronary artery without angina pectoris: Secondary | ICD-10-CM

## 2021-04-21 DIAGNOSIS — I739 Peripheral vascular disease, unspecified: Secondary | ICD-10-CM

## 2021-04-21 MED ORDER — CILOSTAZOL 100 MG PO TABS: 100.0000 mg | ORAL_TABLET | Freq: Two times a day (BID) | ORAL | 5 refills | Status: AC

## 2021-04-21 NOTE — Progress Notes (Signed)
Cardiology Office Note:    Date:  04/21/2021   ID:  Allen Massey, DOB [birth year], MRN [id number]  PCP:  [facility], [place] Providers Cardiologist:  Kate Sable, MD     Referring MD: [facility], [place]   Chief Complaint  Patient presents with   Other    Follow up post ECHO and Myoview. Meds reviewed verbally with patient.     History of Present Illness:    Allen Massey is a 70 y.o. male with a hx of hypertension, hyperlipidemia, diabetes, current smoker x40+ years who presents for follow-up.    Previously seen for coronary artery calcification.  Lung cancer screening chest CT showed three-vessel coronary artery calcifications.  He denies chest pain, but endorses shortness of breath with exertion.  States having a lung doctor, does not remember the name.  Echo and Lexiscan Myoview was ordered to evaluate presence of ischemia.  He endorses calf pain with walking.  He still smokes.  Prior notes Noncontrast chest CT 11/2020, three-vessel coronary artery calcification. Echo 04/2021 normal ejection fraction, EF 60 to 65%, impaired relaxation aortic valve sclerosis without stenosis. Lexiscan Myoview 03/05/2021, no evidence for ischemia, low risk.   Past Medical History:  Diagnosis Date   Cancer ([place])    PROSTATE   Diabetes mellitus without complication ([place])    Hyperlipidemia    Hypertension    Stroke [facility]) 1990'S   MINI STROKE    Past Surgical History:  Procedure Laterality Date   Colon cancer removal  10/30/2019   NO PAST SURGERIES     PELVIC LYMPH NODE DISSECTION Bilateral 10/30/2019   Procedure: PELVIC LYMPH NODE DISSECTION;  Surgeon: Hollice Espy, MD;  Location: [facility] ORS;  Service: Urology;  Laterality: Bilateral;   ROBOT ASSISTED LAPAROSCOPIC RADICAL PROSTATECTOMY N/A 10/30/2019   Procedure: XI ROBOTIC ASSISTED LAPAROSCOPIC RADICAL PROSTATECTOMY;  Surgeon: Hollice Espy, MD;  Location: [facility] ORS;  Service: Urology;  Laterality:  N/A;   UMBILICAL HERNIA REPAIR N/A 10/30/2019   Procedure: HERNIA REPAIR UMBILICAL ADULT;  Surgeon: Hollice Espy, MD;  Location: [facility] ORS;  Service: Urology;  Laterality: N/A;    Current Medications: Current Meds  Medication Sig   aspirin EC 81 MG tablet Take 81 mg by mouth daily.   atorvastatin (LIPITOR) 80 MG tablet Take 1 tablet (80 mg total) by mouth daily.   brimonidine (ALPHAGAN) 0.2 % ophthalmic solution Place 1 drop into both eyes 3 (three) times daily.   cilostazol (PLETAL) 100 MG tablet Take 1 tablet (100 mg total) by mouth 2 (two) times daily.   CIPRO 500 MG tablet Take 500 mg by mouth 2 (two) times daily.   dorzolamide-timolol (COSOPT) 22.3-6.8 MG/ML ophthalmic solution Place 1 drop into both eyes 2 (two) times daily.    latanoprost (XALATAN) 0.005 % ophthalmic solution Place 1 drop into both eyes at bedtime.    losartan (COZAAR) 25 MG tablet Take 25 mg by mouth every morning.    metFORMIN (GLUCOPHAGE) 1000 MG tablet Take 1,000 mg by mouth 2 (two) times daily.    nystatin-triamcinolone ointment (MYCOLOG) Apply 1 application topically 2 (two) times daily.   sildenafil (REVATIO) 20 MG tablet Take 1 tablet (20 mg total) by mouth as needed. Take 1-5 tabs as needed prior to intercourse     Allergies:   Patient has no known allergies.   Social History   Socioeconomic History   Marital status: Married    Spouse name: Not on file   Number of children: Not on  file   Years of education: Not on file   Highest education level: Not on file  Occupational History   Not on file  Tobacco Use   Smoking status: Some Days    Packs/day: 0.25    Years: 48.00    Pack years: 12.00    Types: Cigarettes   Smokeless tobacco: Never  Vaping Use   Vaping Use: Never used  Substance and Sexual Activity   Alcohol use: Yes    Comment: OCC   Drug use: No   Sexual activity: Not Currently    Birth control/protection: None  Other Topics Concern   Not on file  Social History Narrative   Not  on file   Social Determinants of Health   Financial Resource Strain: Not on file  Food Insecurity: Not on file  Transportation Needs: Not on file  Physical Activity: Not on file  Stress: Not on file  Social Connections: Not on file     Family History: The patient's family history includes Diabetes in his brother; Kidney disease in his brother.  ROS:   Please see the history of present illness.     All other systems reviewed and are negative.  EKGs/Labs/Other Studies Reviewed:    The following studies were reviewed today:   EKG:  EKG not ordered today.    Recent Labs: 10/02/2020: Hemoglobin 11.9; Platelets 191  Recent Lipid Panel    Component Value Date/Time   CHOL 284 (H) 02/25/2021 1134   CHOL 162 05/11/2014 0507   TRIG 208 (H) 02/25/2021 1134   TRIG 98 05/11/2014 0507   HDL 51 02/25/2021 1134   HDL 37 (L) 05/11/2014 0507   CHOLHDL 5.6 02/25/2021 1134   VLDL 42 (H) 02/25/2021 1134   VLDL 20 05/11/2014 0507   LDLCALC 191 (H) 02/25/2021 1134   LDLCALC 105 (H) 05/11/2014 0507     Risk Assessment/Calculations:      Physical Exam:    VS:  BP 114/66 (BP Location: Left Arm, Patient Position: Sitting, Cuff Size: Normal)   Pulse 80   Ht 5\' 3"  (1.6 m)   Wt 160 lb (72.6 kg)   SpO2 96%   BMI 28.34 kg/m     Wt Readings from Last 3 Encounters:  04/21/21 160 lb (72.6 kg)  03/07/21 158 lb 9.6 oz (71.9 kg)  02/25/21 164 lb 14.5 oz (74.8 kg)     GEN:  Well nourished, well developed in no acute distress HEENT: Normal NECK: No JVD; No carotid bruits LYMPHATICS: No lymphadenopathy CARDIAC: RRR, no murmurs, rubs, gallops RESPIRATORY: Diminished breath sounds, no wheezing ABDOMEN: Soft, non-tender, non-distended MUSCULOSKELETAL:  No edema; No deformity  SKIN: Warm and dry NEUROLOGIC:  Alert and oriented x 3 PSYCHIATRIC:  Normal affect   ASSESSMENT:    1. Coronary artery disease involving native coronary artery of native heart, unspecified whether angina  present   2. Primary hypertension   3. Pure hypercholesterolemia   4. Smoking   5. Claudication [facility])     PLAN:    In order of problems listed above:  CAD, three-vessel coronary artery calcification, endorses chest pain consistent with angina.  Echocardiogram 04/2021 shows normal systolic function, EF 60 to 65%, impaired relaxation.  Lexiscan Myoview 03/2021 with no evidence for ischemia, low risk scan.  Continue aspirin 81 mg, Lipitor 40 mg daily.  Obtain fasting lipid profile, goal LDL less than 70.  Advised patient to follow-up with pulmonary medicine regarding dyspnea on exertion.  If symptoms persist, may consider heart  cath for an anginal equivalent. Hypertension, BP controlled.  Continue losartan . Hyperlipidemia, continue Lipitor.  Check fasting lipid profile today. Current smoker, cessation advised. Claudication, follows up with vascular surgery.  We will start cilostazol to improve walking distance.  Continue aspirin, statin.  Follow-up in 2 to 3 months.  Evaluate walking distance, consider heart cath pending pulmonary work-up with shortness of breath.      Medication Adjustments/Labs and Tests Ordered: Current medicines are reviewed at length with the patient today.  Concerns regarding medicines are outlined above.  Orders Placed This Encounter  Procedures   Lipid panel    Meds ordered this encounter  Medications   cilostazol (PLETAL) 100 MG tablet    Sig: Take 1 tablet (100 mg total) by mouth 2 (two) times daily.    Dispense:  60 tablet    Refill:  5     Patient Instructions  Medication Instructions:   Your physician has recommended you make the following change in your medication:    START taking Cilostazol (PLETAL) 100 MG twice a day.   *If you need a refill on your cardiac medications before your next appointment, please call your pharmacy*   Lab Work:  Lipid Panel to be drawn today.   Testing/Procedures: None ordered   Follow-Up: At [facility], you and your health needs are our priority.  As part of our continuing mission to provide you with exceptional heart care, we have created designated Provider Care Teams.  These Care Teams include your primary Cardiologist (physician) and Advanced Practice Providers (APPs -  Physician Assistants and Nurse Practitioners) who all work together to provide you with the care you need, when you need it.  We recommend signing up for the patient portal called "MyChart".  Sign up information is provided on this After Visit Summary.  MyChart is used to connect with patients for Virtual Visits (Telemedicine).  Patients are able to view lab/test results, encounter notes, upcoming appointments, etc.  Non-urgent messages can be sent to your provider as well.   To learn more about what you can do with MyChart, go to [email or web address].    Your next appointment:   2 month(s)  The format for your next appointment:   In Person  Provider:   Kate Sable, MD   Other Instructions    Signed, Kate Sable, MD  04/21/2021 12:15 PM    [place]

## 2021-04-21 NOTE — Patient Instructions (Signed)
Medication Instructions:   Your physician has recommended you make the following change in your medication:    START taking Cilostazol (PLETAL) 100 MG twice a day.   *If you need a refill on your cardiac medications before your next appointment, please call your pharmacy*   Lab Work:  Lipid Panel to be drawn today.   Testing/Procedures: None ordered   Follow-Up: At [facility], you and your health needs are our priority.  As part of our continuing mission to provide you with exceptional heart care, we have created designated Provider Care Teams.  These Care Teams include your primary Cardiologist (physician) and Advanced Practice Providers (APPs -  Physician Assistants and Nurse Practitioners) who all work together to provide you with the care you need, when you need it.  We recommend signing up for the patient portal called "MyChart".  Sign up information is provided on this After Visit Summary.  MyChart is used to connect with patients for Virtual Visits (Telemedicine).  Patients are able to view lab/test results, encounter notes, upcoming appointments, etc.  Non-urgent messages can be sent to your provider as well.   To learn more about what you can do with MyChart, go to [email or web address].    Your next appointment:   2 month(s)  The format for your next appointment:   In Person  Provider:   Kate Sable, MD   Other Instructions

## 2021-04-22 LAB — LIPID PANEL
Chol/HDL Ratio: 3 ratio (ref 0.0–5.0)
Cholesterol, Total: 152 mg/dL (ref 100–199)
HDL: 50 mg/dL (ref >39)
LDL Chol Calc (NIH): 88 mg/dL (ref 0–99)
Triglycerides: 72 mg/dL (ref 0–149)
VLDL Cholesterol Cal: 14 mg/dL (ref 5–40)

## 2021-04-23 ENCOUNTER — Encounter: Payer: Self-pay | Admitting: Radiation Oncology

## 2021-04-23 ENCOUNTER — Ambulatory Visit (INDEPENDENT_AMBULATORY_CARE_PROVIDER_SITE_OTHER): Payer: Medicare Other | Admitting: Urology

## 2021-04-23 ENCOUNTER — Encounter: Payer: Self-pay | Admitting: Urology

## 2021-04-23 ENCOUNTER — Other Ambulatory Visit: Payer: Self-pay

## 2021-04-23 ENCOUNTER — Ambulatory Visit
Admission: RE | Admit: 2021-04-23 | Discharge: 2021-04-23 | Disposition: A | Discharge status: Home / Self Care | Payer: Medicare Other | Source: Ambulatory Visit | Attending: Radiation Oncology | Admitting: Radiation Oncology

## 2021-04-23 VITALS — BP 141/82 | HR 89 | Ht 63.0 in | Wt 165.0 lb

## 2021-04-23 VITALS — BP 137/78 | HR 95 | Temp 95.0°F | Wt 165.5 lb

## 2021-04-23 DIAGNOSIS — N476 Balanoposthitis: Secondary | ICD-10-CM

## 2021-04-23 DIAGNOSIS — C61 Malignant neoplasm of prostate: Secondary | ICD-10-CM | POA: Diagnosis present

## 2021-04-23 DIAGNOSIS — I6523 Occlusion and stenosis of bilateral carotid arteries: Secondary | ICD-10-CM | POA: Diagnosis not present

## 2021-04-23 DIAGNOSIS — Z923 Personal history of irradiation: Secondary | ICD-10-CM | POA: Diagnosis present

## 2021-04-23 NOTE — Progress Notes (Signed)
Radiation Oncology Follow up Note  Name: Allen Massey   Date:   04/23/2021 MRN:  [id number] DOB: 1951/06/16    This 70 y.o. 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  REFERRING PROVIDER: [facility]  HPI: Patient is a 70 year old male now about 10 months having completed salvage radiation therapy to his prostatic fossa status post robotic assisted prostatectomy for stage IIIb Gleason 7 (3+4) with biochemical failure seen today in routine follow-up he states he is having some low back pressure when he urinates.  No significant lower urinary tract symptoms no frequency urgency.  His bowel function is good.  His most recent PSA is less than 0.1.  Which has been stable over the past year.  His last PSA was 3 months prior.  Patient did have lungs CT screening back in February which I reviewed shows no evidence of active pulmonary disease.  COMPLICATIONS OF TREATMENT: none  FOLLOW UP COMPLIANCE: keeps appointments   PHYSICAL EXAM:  BP 137/78   Pulse 95   Temp (!) 95 F (35 C)   Wt 165 lb 8 oz (75.1 kg)   BMI 29.32 kg/m  Well-developed well-nourished patient in NAD. HEENT reveals PERLA, EOMI, discs not visualized.  Oral cavity is clear. No oral mucosal lesions are identified. Neck is clear without evidence of cervical or supraclavicular adenopathy. Lungs are clear to A&P. Cardiac examination is essentially unremarkable with regular rate and rhythm without murmur rub or thrill. Abdomen is benign with no organomegaly or masses noted. Motor sensory and DTR levels are equal and symmetric in the upper and lower extremities. Cranial nerves II through XII are grossly intact. Proprioception is intact. No peripheral adenopathy or edema is identified. No motor or sensory levels are  noted. Crude visual fields are within normal range.  RADIOLOGY RESULTS: CT scan reviewed compatible with above-stated findings  PLAN: Present time patient is under excellent biochemical control status post salvage radiation therapy for Gleason 7 adenocarcinoma status post robotic assisted prostatectomy.  He will continue address any urologic problems with urology.  He continues to have PSAs tested that they are.  I have asked to see him back in 6 months for follow-up and then will start once year follow-up visits.  Patient knows to call with any concerns.  I would like to take this opportunity to thank you for allowing me to participate in the care of your patient.Noreene Filbert, MD

## 2021-04-23 NOTE — Progress Notes (Signed)
04/23/21  HPI: 70 year old male who presents today for a brief penile check after experiencing relatively acute penile swelling/edema and erythema after a bout of scrotal and testicular pain.  He was initially prescribed oral antibiotics and later this was changed to triamcinolone/nystatin cream for presumed bioprosthesis.  Today, he reports that his penile swelling and redness irritation have completely resolved.  He is doing well.  He has no urinary complaints.  On exam today, he has a normal uncircumcised phallus and his foreskin now is completely normal and easily retractable with no residual changes of the glans, glandular erythema, or edema.  A/P: 1. Balanoposthitis REsolved  Return as scheduled for prostate cancer monitoring or if he develops recurrent pain and ordered swelling of the genitalia  Hollice Espy, MD

## 2021-05-10 ENCOUNTER — Other Ambulatory Visit: Payer: Self-pay

## 2021-05-10 ENCOUNTER — Emergency Department
Admission: EM | Admit: 2021-05-10 | Discharge: 2021-05-10 | Disposition: A | Discharge status: Home / Self Care | Payer: Medicare Other | Attending: Emergency Medicine | Admitting: Emergency Medicine

## 2021-05-10 ENCOUNTER — Emergency Department: Payer: Medicare Other

## 2021-05-10 DIAGNOSIS — E119 Type 2 diabetes mellitus without complications: Secondary | ICD-10-CM | POA: Insufficient documentation

## 2021-05-10 DIAGNOSIS — N390 Urinary tract infection, site not specified: Secondary | ICD-10-CM | POA: Diagnosis not present

## 2021-05-10 DIAGNOSIS — Z7982 Long term (current) use of aspirin: Secondary | ICD-10-CM | POA: Insufficient documentation

## 2021-05-10 DIAGNOSIS — Z7984 Long term (current) use of oral hypoglycemic drugs: Secondary | ICD-10-CM | POA: Diagnosis not present

## 2021-05-10 DIAGNOSIS — F17210 Nicotine dependence, cigarettes, uncomplicated: Secondary | ICD-10-CM | POA: Insufficient documentation

## 2021-05-10 DIAGNOSIS — Z79899 Other long term (current) drug therapy: Secondary | ICD-10-CM | POA: Diagnosis not present

## 2021-05-10 DIAGNOSIS — N451 Epididymitis: Secondary | ICD-10-CM | POA: Diagnosis not present

## 2021-05-10 DIAGNOSIS — I10 Essential (primary) hypertension: Secondary | ICD-10-CM | POA: Insufficient documentation

## 2021-05-10 DIAGNOSIS — N50811 Right testicular pain: Secondary | ICD-10-CM | POA: Diagnosis present

## 2021-05-10 DIAGNOSIS — Z8546 Personal history of malignant neoplasm of prostate: Secondary | ICD-10-CM | POA: Diagnosis not present

## 2021-05-10 LAB — CBC WITH DIFFERENTIAL/PLATELET
Abs Immature Granulocytes: 0.01 10*3/uL (ref 0.00–0.07)
Basophils Absolute: 0 10*3/uL (ref 0.0–0.1)
Basophils Relative: 0 %
Eosinophils Absolute: 0.1 10*3/uL (ref 0.0–0.5)
Eosinophils Relative: 1 %
HCT: 34.4 % — ABNORMAL LOW (ref 39.0–52.0)
Hemoglobin: 11.5 g/dL — ABNORMAL LOW (ref 13.0–17.0)
Immature Granulocytes: 0 %
Lymphocytes Relative: 20 %
Lymphs Abs: 1.4 10*3/uL (ref 0.7–4.0)
MCH: 30.7 pg (ref 26.0–34.0)
MCHC: 33.4 g/dL (ref 30.0–36.0)
MCV: 92 fL (ref 80.0–100.0)
Monocytes Absolute: 0.5 10*3/uL (ref 0.1–1.0)
Monocytes Relative: 7 %
Neutro Abs: 5 10*3/uL (ref 1.7–7.7)
Neutrophils Relative %: 72 %
Platelets: 269 10*3/uL (ref 150–400)
RBC: 3.74 MIL/uL — ABNORMAL LOW (ref 4.22–5.81)
RDW: 12.7 % (ref 11.5–15.5)
WBC: 7.1 10*3/uL (ref 4.0–10.5)
nRBC: 0 % (ref 0.0–0.2)

## 2021-05-10 LAB — URINALYSIS, COMPLETE (UACMP) WITH MICROSCOPIC
Bilirubin Urine: NEGATIVE
Glucose, UA: NEGATIVE mg/dL
Ketones, ur: NEGATIVE mg/dL
Nitrite: NEGATIVE
Protein, ur: 100 mg/dL — AB
Specific Gravity, Urine: 1.015 (ref 1.005–1.030)
WBC, UA: >50 WBC/hpf — ABNORMAL HIGH (ref 0–5)
pH: 5 (ref 5.0–8.0)

## 2021-05-10 LAB — COMPREHENSIVE METABOLIC PANEL
ALT: 13 U/L (ref 0–44)
AST: 16 U/L (ref 15–41)
Albumin: 3.8 g/dL (ref 3.5–5.0)
Alkaline Phosphatase: 67 U/L (ref 38–126)
Anion gap: 9 (ref 5–15)
BUN: 22 mg/dL (ref 8–23)
CO2: 25 mmol/L (ref 22–32)
Calcium: 9 mg/dL (ref 8.9–10.3)
Chloride: 105 mmol/L (ref 98–111)
Creatinine, Ser: 1.24 mg/dL (ref 0.61–1.24)
GFR, Estimated: >60 mL/min (ref >60)
Glucose, Bld: 149 mg/dL — ABNORMAL HIGH (ref 70–99)
Potassium: 4 mmol/L (ref 3.5–5.1)
Sodium: 139 mmol/L (ref 135–145)
Total Bilirubin: 0.6 mg/dL (ref 0.3–1.2)
Total Protein: 7.5 g/dL (ref 6.5–8.1)

## 2021-05-10 MED ORDER — CEFDINIR 300 MG PO CAPS
300.0000 mg | ORAL_CAPSULE | Freq: Once | ORAL | Status: AC
  Administered 2021-05-10: 300 mg via ORAL
  Filled 2021-05-10: qty 1

## 2021-05-10 MED ORDER — HYDROCODONE-ACETAMINOPHEN 5-325 MG PO TABS
1.0000 | ORAL_TABLET | Freq: Once | ORAL | Status: AC
  Administered 2021-05-10: 1 via ORAL
  Filled 2021-05-10: qty 1

## 2021-05-10 MED ORDER — PHENAZOPYRIDINE HCL 95 MG PO TABS: 95.0000 mg | ORAL_TABLET | Freq: Three times a day (TID) | ORAL | 0 refills | Status: DC | PRN

## 2021-05-10 MED ORDER — CEFDINIR 300 MG PO CAPS: 300.0000 mg | ORAL_CAPSULE | Freq: Two times a day (BID) | ORAL | 0 refills | Status: AC

## 2021-05-10 NOTE — ED Provider Notes (Signed)
[facility] Emergency Department Provider Note   ____________________________________________   Event Date/Time   First MD Initiated Contact with Patient 05/10/21 2000     (approximate)  I have reviewed the triage vital signs and the nursing notes.   HISTORY  Chief Complaint Testicle Pain    HPI Allen Massey is a 70 y.o. male who presents for testicular pain  LOCATION: Testicles DURATION: 1 week TIMING: Worsening since onset SEVERITY: 8/10 QUALITY: Aching CONTEXT: Patient states that he initially noticed swelling and right testicular pain approximately 1 week ago that now spread to the left testicle MODIFYING FACTORS: Any palpation of either testicle worsens this pain and partially relieved at rest or with elevation ASSOCIATED SYMPTOMS: Dysuria   Per medical record review, patient does have history of prostate cancer          Past Medical History:  Diagnosis Date   Cancer ([place])    PROSTATE   Diabetes mellitus without complication ([place])    Hyperlipidemia    Hypertension    Stroke [facility]) 1990'S   MINI STROKE    Patient Active Problem List   Diagnosis Date Noted   Prostate cancer ([place]) 10/30/2019   Carotid stenosis 05/15/2019   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 ([place]) 05/15/2019   Essential hypertension 05/15/2019   Diabetes ([place]) 05/15/2019    Past Surgical History:  Procedure Laterality Date   Colon cancer removal  10/30/2019   NO PAST SURGERIES     PELVIC LYMPH NODE DISSECTION Bilateral 10/30/2019   Procedure: PELVIC LYMPH NODE DISSECTION;  Surgeon: Hollice Espy, MD;  Location: [facility] ORS;  Service: Urology;  Laterality: Bilateral;   ROBOT ASSISTED LAPAROSCOPIC RADICAL PROSTATECTOMY N/A 10/30/2019   Procedure: XI ROBOTIC ASSISTED LAPAROSCOPIC RADICAL PROSTATECTOMY;  Surgeon: Hollice Espy, MD;  Location: [facility] ORS;  Service: Urology;  Laterality: N/A;   UMBILICAL HERNIA REPAIR N/A 10/30/2019    Procedure: HERNIA REPAIR UMBILICAL ADULT;  Surgeon: Hollice Espy, MD;  Location: [facility] ORS;  Service: Urology;  Laterality: N/A;    Prior to Admission medications   Medication Sig Start Date End Date Taking? Authorizing Provider  cefdinir (OMNICEF) 300 MG capsule Take 1 capsule (300 mg total) by mouth 2 (two) times daily for 5 days. 05/10/21 05/15/21 Yes Naaman Plummer, MD  phenazopyridine (PYRIDIUM) 95 MG tablet Take 1 tablet (95 mg total) by mouth 3 (three) times daily as needed for pain. 05/10/21  Yes Naaman Plummer, MD  aspirin EC 81 MG tablet Take 81 mg by mouth daily.    [provider]  atorvastatin (LIPITOR) 80 MG tablet Take 1 tablet (80 mg total) by mouth daily. 02/26/21 05/27/21  Kate Sable, MD  brimonidine (ALPHAGAN) 0.2 % ophthalmic solution Place 1 drop into both eyes 3 (three) times daily.    [provider]  cilostazol (PLETAL) 100 MG tablet Take 1 tablet (100 mg total) by mouth 2 (two) times daily. 04/21/21   Kate Sable, MD  dorzolamide-timolol (COSOPT) 22.3-6.8 MG/ML ophthalmic solution Place 1 drop into both eyes 2 (two) times daily.  04/25/19   [provider]  latanoprost (XALATAN) 0.005 % ophthalmic solution Place 1 drop into both eyes at bedtime.  04/25/19   [provider]  losartan (COZAAR) 25 MG tablet Take 25 mg by mouth every morning.  01/31/19   [provider]  metFORMIN (GLUCOPHAGE) 1000 MG tablet Take 1,000 mg by mouth 2 (two) times daily.  04/12/19   [provider]  nystatin-triamcinolone ointment (  MYCOLOG) Apply 1 application topically 2 (two) times daily. 03/07/21   Vaillancourt, Aldona Bar, PA-C  sildenafil (REVATIO) 20 MG tablet Take 1 tablet (20 mg total) by mouth as needed. Take 1-5 tabs as needed prior to intercourse 01/08/21   Hollice Espy, MD    Allergies Patient has no known allergies.  Family History  Problem Relation Age of Onset   Diabetes Brother    Kidney disease Brother      Social History Social History   Tobacco Use   Smoking status: Some Days    Packs/day: 0.25    Years: 48.00    Pack years: 12.00    Types: Cigarettes   Smokeless tobacco: Never  Vaping Use   Vaping Use: Never used  Substance Use Topics   Alcohol use: Yes    Comment: OCC   Drug use: No    Review of Systems Constitutional: No fever/chills Eyes: No visual changes. ENT: No sore throat. Cardiovascular: Denies chest pain. Respiratory: Denies shortness of breath. Gastrointestinal: No abdominal pain.  No nausea, no vomiting.  No diarrhea. Genitourinary: Positive for dysuria and testicular pain Musculoskeletal: Negative for acute arthralgias Skin: Negative for rash. Neurological: Negative for headaches, weakness/numbness/paresthesias in any extremity Psychiatric: Negative for suicidal ideation/homicidal ideation   ____________________________________________   PHYSICAL EXAM:  VITAL SIGNS: ED Triage Vitals  Enc Vitals Group     BP 05/10/21 1648 (!) 178/102     Pulse Rate 05/10/21 1648 98     Resp 05/10/21 1648 18     Temp 05/10/21 1648 98.9 F (37.2 C)     Temp Source 05/10/21 1648 Oral     SpO2 05/10/21 1648 97 %     Weight 05/10/21 1650 155 lb (70.3 kg)     Height 05/10/21 1650 '5\' 6"'$  (1.676 m)     Head Circumference --      Peak Flow --      Pain Score 05/10/21 1649 8     Pain Loc --      Pain Edu? --      Excl. in [place]? --    Constitutional: Alert and oriented. Well appearing and in no acute distress. Eyes: Conjunctivae are normal. PERRL. Head: Atraumatic. Nose: No congestion/rhinnorhea. Mouth/Throat: Mucous membranes are moist. Neck: No stridor Cardiovascular: Grossly normal heart sounds.  Good peripheral circulation. Respiratory: Normal respiratory effort.  No retractions. Gastrointestinal: Soft and nontender. No distention. Genitourinary: Normal external circumcised male genitalia without lesions or rashes.  Testicles in normal lie and significant  tenderness to palpation especially in the epididymis bilaterally Musculoskeletal: No obvious deformities Neurologic:  Normal speech and language. No gross focal neurologic deficits are appreciated. Skin:  Skin is warm and dry. No rash noted. Psychiatric: Mood and affect are normal. Speech and behavior are normal.  ____________________________________________   LABS (all labs ordered are listed, but only abnormal results are displayed)  Labs Reviewed  COMPREHENSIVE METABOLIC PANEL - Abnormal; Notable for the following components:      Result Value   Glucose, Bld 149 (*)    All other components within normal limits  CBC WITH DIFFERENTIAL/PLATELET - Abnormal; Notable for the following components:   RBC 3.74 (*)    Hemoglobin 11.5 (*)    HCT 34.4 (*)    All other components within normal limits  URINALYSIS, COMPLETE (UACMP) WITH MICROSCOPIC - Abnormal; Notable for the following components:   Color, Urine YELLOW (*)    APPearance CLOUDY (*)    Hgb urine dipstick SMALL (*)  Protein, ur 100 (*)    Leukocytes,Ua LARGE (*)    WBC, UA >50 (*)    Bacteria, UA RARE (*)    All other components within normal limits   RADIOLOGY  ED MD interpretation: Ultrasound of the scrotum with Doppler shows epididymitis  Official radiology report(s): US SCROTUM W/DOPPLER  Result Date: 05/10/2021 CLINICAL DATA:  Left testicular pain for 1 week EXAM: SCROTAL ULTRASOUND DOPPLER ULTRASOUND OF THE TESTICLES TECHNIQUE: Complete ultrasound examination of the testicles, epididymis, and other scrotal structures was performed. Color and spectral Doppler ultrasound were also utilized to evaluate blood flow to the testicles. COMPARISON:  Ultrasound 02/25/2021 FINDINGS: Right testicle Measurements: 3.3 x 2.3 x 2.5 cm. Mild striations. No concerning focal testicular lesion or microlithiasis. Left testicle Measurements: 3.2 x 2.6 x 2.6 cm. Mild striations. No concerning focal tech particular mass or microlithiasis.  Right epididymis: 1.1 x 0.9 x 0.8 cm. Mild heterogeneity and thickening of the epididymal tail. Left epididymis: 1.4 x 1.1 x 1 cm. Mild heterogeneity and thickening of the epididymal tail. Hydrocele: Small left hydrocele. No worrisome internal septation or other features of frank pyocele. No sizable right hydrocele with normal physiologic paratesticular fluid. Varicocele:  None visualized. Other: Bilateral scrotal skin thickening and edema noted, left greater than right. Pulsed Doppler interrogation of both testes demonstrates normal low resistance arterial and venous waveforms bilaterally. IMPRESSION: Heterogeneous thickening of the bilateral epididymal tail with overlying scrotal skin thickening, could reflect early epididymitis. Small left hydrocele is likely reactive, without worrisome sonographic features of pyocele. No residual right hydrocele. Chronic testicular striations, a nonspecific finding though most often the result of interstitial fibrosis in elderly patients. Electronically Signed   By: Lovena Le M.D.   On: 05/10/2021 21:01    ____________________________________________   PROCEDURES  Procedure(s) performed (including Critical Care):  .1-3 Lead EKG Interpretation  Date/Time: 05/10/2021 9:48 PM Performed by: Naaman Plummer, MD Authorized by: Naaman Plummer, MD     Interpretation: normal     ECG rate:  97   ECG rate assessment: normal     Rhythm: sinus rhythm     Ectopy: none     Conduction: normal     ____________________________________________   INITIAL IMPRESSION / ASSESSMENT AND PLAN / ED COURSE  As part of my medical decision making, I reviewed the following data within the electronic medical record, if available:  Nursing notes reviewed and incorporated, Labs reviewed, EKG interpreted, Old chart reviewed, Radiograph reviewed and Notes from prior ED visits reviewed and incorporated        The patient is suffering from testicular pain, but based on the history,  exam, and testing, I do not suspect that the patient has testicular torsion, abscess, severe cellulitis, Fournier's gangrene, or other emergent cause.  UA positive for UTI US Scrotum: Shows likely early epididymitis  Disposition: Plan follow up with primary care doctor for symptom re-check and possible referral to urology. Discussed return precautions at bedside. Discharge.      ____________________________________________   FINAL CLINICAL IMPRESSION(S) / ED DIAGNOSES  Final diagnoses:  Epididymitis  Urinary tract infection in male     ED Discharge Orders          Ordered    cefdinir (OMNICEF) 300 MG capsule  2 times daily        05/10/21 2139    phenazopyridine (PYRIDIUM) 95 MG tablet  3 times daily PRN        05/10/21 2139  Note:  This document was prepared using Dragon voice recognition software and may include unintentional dictation errors.    Naaman Plummer, MD 05/10/21 [phone]

## 2021-05-10 NOTE — ED Triage Notes (Signed)
Pt states he started having R testicular pain about 1 week ago- pt states he noticed swelling a few days ago in both- pt then started complaining of L testicular pain while in triage room

## 2021-05-10 NOTE — ED Notes (Signed)
Pt to ultrasound

## 2021-05-10 NOTE — ED Notes (Signed)
Pt updated waiting on omnicef from pharmacy. Pt states he does have a driver home, pt verbalizes understanding he is not to drive after administration of pain medication.

## 2021-05-16 ENCOUNTER — Encounter: Payer: Self-pay | Admitting: Physician Assistant

## 2021-05-16 ENCOUNTER — Other Ambulatory Visit: Payer: Self-pay

## 2021-05-16 ENCOUNTER — Ambulatory Visit (INDEPENDENT_AMBULATORY_CARE_PROVIDER_SITE_OTHER): Payer: Medicare Other | Admitting: Physician Assistant

## 2021-05-16 VITALS — BP 150/70 | HR 97 | Ht 66.0 in | Wt 160.0 lb

## 2021-05-16 DIAGNOSIS — N451 Epididymitis: Secondary | ICD-10-CM | POA: Diagnosis not present

## 2021-05-16 LAB — URINALYSIS, COMPLETE
Bilirubin, UA: NEGATIVE
Glucose, UA: NEGATIVE
Ketones, UA: NEGATIVE
Leukocytes,UA: NEGATIVE
Nitrite, UA: NEGATIVE
Specific Gravity, UA: 1.025 (ref 1.005–1.030)
Urobilinogen, Ur: 0.2 mg/dL (ref 0.2–1.0)
pH, UA: 5.5 (ref 5.0–7.5)

## 2021-05-16 LAB — MICROSCOPIC EXAMINATION: Bacteria, UA: NONE SEEN

## 2021-05-16 MED ORDER — LEVOFLOXACIN 500 MG PO TABS: 500.0000 mg | ORAL_TABLET | Freq: Every day | ORAL | 0 refills | Status: AC

## 2021-05-16 NOTE — Patient Instructions (Signed)
Swelling in the scrotum may take some time to resolve, as the scrotum is located below your torso and fluid has to work against gravity to leave this tissue. To promote resolution of swelling and decrease discomfort, please follow the scrotal support instructions below.  Scrotal support instructions:  Wear compressive underwear, e.g. jockstrap or snug boxer briefs, to keep the scrotum supported and promote drainage of swelling. Elevate the scrotum when at rest. Tuck a rolled washcloth or towel underneath the scrotum to lift it up and promote drainage back into your abdomen. Use ice as needed for pain relief. Never apply ice to the scrotum for longer than 20 minutes at a time and always keep a layer of fabric between the ice and the skin.  

## 2021-05-16 NOTE — Progress Notes (Signed)
05/16/2021 4:54 PM   Allen Massey Jul 28, 1951 [id number]  CC: Chief Complaint  Patient presents with   Follow-up    Swollen testicles   HPI: Allen Massey is a 70 y.o. male with PMH prostate cancer s/p prostatectomy, ADT, and salvage radiation; ED; epididymoorchitis; and balanoposthitis who presents today for evaluation of testicular swelling and pain.  He was seen in the emergency department 6 days ago for evaluation of the same.  UA at the time was notable for >50 WBCs/hpf, 11-20 RBCs/hpf, rare bacteria, and WBC clumps.  For unclear reasons, a urine culture was not sent.  Scrotal ultrasound with Doppler revealed heterogeneous thickening of the bilateral epididymal tails consistent with early epididymitis as well as a small left hydrocele, likely reactive.  He was prescribed 5 days of Omnicef, which he completed yesterday.  Today he reports ongoing bilateral testicular pain and swelling, L>R.  He states he has not had sex in several months.  In-office UA today positive for trace lysed blood and 2+ protein; urine microscopy with 6-10 WBCs/HPF.  PMH: Past Medical History:  Diagnosis Date   Cancer ([place])    PROSTATE   Diabetes mellitus without complication ([place])    Hyperlipidemia    Hypertension    Stroke [facility]) 1990'S   MINI STROKE    Surgical History: Past Surgical History:  Procedure Laterality Date   Colon cancer removal  10/30/2019   NO PAST SURGERIES     PELVIC LYMPH NODE DISSECTION Bilateral 10/30/2019   Procedure: PELVIC LYMPH NODE DISSECTION;  Surgeon: Hollice Espy, MD;  Location: [facility] ORS;  Service: Urology;  Laterality: Bilateral;   ROBOT ASSISTED LAPAROSCOPIC RADICAL PROSTATECTOMY N/A 10/30/2019   Procedure: XI ROBOTIC ASSISTED LAPAROSCOPIC RADICAL PROSTATECTOMY;  Surgeon: Hollice Espy, MD;  Location: [facility] ORS;  Service: Urology;  Laterality: N/A;   UMBILICAL HERNIA REPAIR N/A 10/30/2019   Procedure: HERNIA REPAIR UMBILICAL ADULT;  Surgeon: Hollice Espy,  MD;  Location: [facility] ORS;  Service: Urology;  Laterality: N/A;    Home Medications:  Allergies as of 05/16/2021   No Known Allergies      Medication List        Accurate as of May 16, 2021  4:54 PM. If you have any questions, ask your nurse or doctor.          aspirin EC 81 MG tablet Take 81 mg by mouth daily.   atorvastatin 80 MG tablet Commonly known as: LIPITOR Take 1 tablet (80 mg total) by mouth daily.   brimonidine 0.2 % ophthalmic solution Commonly known as: ALPHAGAN Place 1 drop into both eyes 3 (three) times daily.   cilostazol 100 MG tablet Commonly known as: PLETAL Take 1 tablet (100 mg total) by mouth 2 (two) times daily.   dorzolamide-timolol 22.3-6.8 MG/ML ophthalmic solution Commonly known as: COSOPT Place 1 drop into both eyes 2 (two) times daily.   latanoprost 0.005 % ophthalmic solution Commonly known as: XALATAN Place 1 drop into both eyes at bedtime.   levofloxacin 500 MG tablet Commonly known as: LEVAQUIN Take 1 tablet (500 mg total) by mouth daily for 10 days. Started by: Debroah Loop, PA-C   losartan 25 MG tablet Commonly known as: COZAAR Take 25 mg by mouth every morning.   metFORMIN 1000 MG tablet Commonly known as: GLUCOPHAGE Take 1,000 mg by mouth 2 (two) times daily.   nystatin-triamcinolone ointment Commonly known as: MYCOLOG Apply 1 application topically 2 (two) times daily.   phenazopyridine 95 MG tablet Commonly known as: PYRIDIUM Take  1 tablet (95 mg total) by mouth 3 (three) times daily as needed for pain.   sildenafil 20 MG tablet Commonly known as: Revatio Take 1 tablet (20 mg total) by mouth as needed. Take 1-5 tabs as needed prior to intercourse        Allergies:  No Known Allergies  Family History: Family History  Problem Relation Age of Onset   Diabetes Brother    Kidney disease Brother     Social History:   reports that he has been smoking cigarettes. He has a 12.00 pack-year smoking  history. He has never used smokeless tobacco. He reports current alcohol use. He reports that he does not use drugs.  Physical Exam: BP (!) 150/70   Pulse 97   Ht '5\' 6"'$  (1.676 m)   Wt 160 lb (72.6 kg)   BMI 25.82 kg/m   Constitutional:  Alert and oriented, no acute distress, nontoxic appearing HEENT: [place], AT Cardiovascular: No clubbing, cyanosis, or edema Respiratory: Normal respiratory effort, no increased work of breathing GU: Previously noted penile swelling significantly improved, now able to retract the foreskin.  Bilateral descended testicles, left testicle is enlarged and tender to palpation.  Left epididymis is also palpable and tender. Skin: No rashes, bruises or suspicious lesions Neurologic: Grossly intact, no focal deficits, moving all 4 extremities Psychiatric: Normal mood and affect  Laboratory Data: Results for orders placed or performed in visit on 05/16/21  Microscopic Examination   Urine  Result Value Ref Range   WBC, UA 6-10 (A) 0 - 5 /hpf   RBC 0-2 0 - 2 /hpf   Epithelial Cells (non renal) 0-10 0 - 10 /hpf   Casts Present (A) None seen /lpf   Cast Type Hyaline casts N/A   Bacteria, UA None seen None seen/Few  Urinalysis, Complete  Result Value Ref Range   Specific Gravity, UA 1.025 1.005 - 1.030   pH, UA 5.5 5.0 - 7.5   Color, UA Yellow Yellow   Appearance Ur Hazy (A) Clear   Leukocytes,UA Negative Negative   Protein,UA 2+ (A) Negative/Trace   Glucose, UA Negative Negative   Ketones, UA Negative Negative   RBC, UA Trace (A) Negative   Bilirubin, UA Negative Negative   Urobilinogen, Ur 0.2 0.2 - 1.0 mg/dL   Nitrite, UA Negative Negative   Microscopic Examination See below:    Assessment & Plan:   1. Left epididymitis Imaging, UA, and physical exam consistent with recurrent epididymitis.  I am concerned that 5 days of Omnicef was insufficient to clear the infection, though his UA is markedly improved over prior today.  Will send for culture for further  evaluation today, but this will likely result negative given antibiotic use.  Will treat with 10 days of levofloxacin.  We discussed scrotal support and I provided him with written guidance today. - Urinalysis, Complete - CULTURE, URINE COMPREHENSIVE - levofloxacin (LEVAQUIN) 500 MG tablet; Take 1 tablet (500 mg total) by mouth daily for 10 days.  Dispense: 10 tablet; Refill: 0  Return if symptoms worsen or fail to improve.  Debroah Loop, PA-C  [facility] [address] [place], [place] [address] [phone]

## 2021-05-21 LAB — CULTURE, URINE COMPREHENSIVE

## 2021-06-16 ENCOUNTER — Encounter (INDEPENDENT_AMBULATORY_CARE_PROVIDER_SITE_OTHER): Payer: Self-pay | Admitting: Nurse Practitioner

## 2021-06-16 ENCOUNTER — Other Ambulatory Visit: Payer: Self-pay

## 2021-06-16 ENCOUNTER — Ambulatory Visit (INDEPENDENT_AMBULATORY_CARE_PROVIDER_SITE_OTHER): Payer: Medicare Other

## 2021-06-16 ENCOUNTER — Ambulatory Visit (INDEPENDENT_AMBULATORY_CARE_PROVIDER_SITE_OTHER): Payer: Medicare Other | Admitting: Nurse Practitioner

## 2021-06-16 VITALS — BP 134/73 | HR 89 | Resp 16 | Wt 166.0 lb

## 2021-06-16 DIAGNOSIS — I6523 Occlusion and stenosis of bilateral carotid arteries: Secondary | ICD-10-CM | POA: Diagnosis not present

## 2021-06-16 DIAGNOSIS — E1151 Type 2 diabetes mellitus with diabetic peripheral angiopathy without gangrene: Secondary | ICD-10-CM

## 2021-06-16 DIAGNOSIS — I739 Peripheral vascular disease, unspecified: Secondary | ICD-10-CM | POA: Diagnosis not present

## 2021-06-16 DIAGNOSIS — I10 Essential (primary) hypertension: Secondary | ICD-10-CM | POA: Diagnosis not present

## 2021-06-16 DIAGNOSIS — I70219 Atherosclerosis of native arteries of extremities with intermittent claudication, unspecified extremity: Secondary | ICD-10-CM | POA: Diagnosis not present

## 2021-06-16 DIAGNOSIS — F17200 Nicotine dependence, unspecified, uncomplicated: Secondary | ICD-10-CM

## 2021-06-22 ENCOUNTER — Encounter (INDEPENDENT_AMBULATORY_CARE_PROVIDER_SITE_OTHER): Payer: Self-pay | Admitting: Nurse Practitioner

## 2021-06-22 NOTE — Progress Notes (Signed)
Subjective:    Patient ID: Allen Massey, male    DOB: 05-Jun-1951, 70 y.o.   MRN: [id number] Chief Complaint  Patient presents with   Follow-up    Ultrasound follow up    Allen Massey is a 70 year old male that returns to the office for followup and review of the noninvasive studies. There have been no interval changes in lower extremity symptoms. No interval shortening of the patient's claudication distance or development of rest pain symptoms. No new ulcers or wounds have occurred since the last visit.  There have been no significant changes to the patient's overall health care.  The patient denies amaurosis fugax or recent TIA symptoms. There are no recent neurological changes noted. The patient denies history of DVT, PE or superficial thrombophlebitis. The patient denies recent episodes of angina or shortness of breath.   ABI Rt=0.93 and Lt=0.64  (previous ABI's Rt=0.82 and Lt=0.82) Duplex ultrasound of the bilateral tibial arteries reveals strong monophasic waveforms with good toe waveforms bilaterally   Review of Systems  Musculoskeletal:  Positive for gait problem.  All other systems reviewed and are negative.     Objective:   Physical Exam Vitals reviewed.  HENT:     Head: Normocephalic.  Cardiovascular:     Rate and Rhythm: Normal rate.     Comments: Nonpalpable pedal pulses Pulmonary:     Effort: Pulmonary effort is normal.  Skin:    General: Skin is warm and dry.  Neurological:     Mental Status: He is alert and oriented to person, place, and time.  Psychiatric:        Mood and Affect: Mood normal.        Behavior: Behavior normal.        Thought Content: Thought content normal.        Judgment: Judgment normal.    BP 134/73 (BP Location: Left Arm)   Pulse 89   Resp 16   Wt 166 lb (75.3 kg)   BMI 26.79 kg/m   Past Medical History:  Diagnosis Date   Cancer ([place])    PROSTATE   Diabetes mellitus without complication ([place])    Hyperlipidemia     Hypertension    Stroke ([place]) 1990'S   MINI STROKE    Social History   Socioeconomic History   Marital status: Married    Spouse name: Not on file   Number of children: Not on file   Years of education: Not on file   Highest education level: Not on file  Occupational History   Not on file  Tobacco Use   Smoking status: Some Days    Packs/day: 0.25    Years: 48.00    Pack years: 12.00    Types: Cigarettes   Smokeless tobacco: Never  Vaping Use   Vaping Use: Never used  Substance and Sexual Activity   Alcohol use: Yes    Comment: OCC   Drug use: No   Sexual activity: Not Currently    Birth control/protection: None  Other Topics Concern   Not on file  Social History Narrative   Not on file   Social Determinants of Health   Financial Resource Strain: Not on file  Food Insecurity: Not on file  Transportation Needs: Not on file  Physical Activity: Not on file  Stress: Not on file  Social Connections: Not on file  Intimate Partner Violence: Not on file    Past Surgical History:  Procedure Laterality Date   Colon cancer removal  10/30/2019   NO PAST SURGERIES     PELVIC LYMPH NODE DISSECTION Bilateral 10/30/2019   Procedure: PELVIC LYMPH NODE DISSECTION;  Surgeon: Hollice Espy, MD;  Location: [facility] ORS;  Service: Urology;  Laterality: Bilateral;   ROBOT ASSISTED LAPAROSCOPIC RADICAL PROSTATECTOMY N/A 10/30/2019   Procedure: XI ROBOTIC ASSISTED LAPAROSCOPIC RADICAL PROSTATECTOMY;  Surgeon: Hollice Espy, MD;  Location: [facility] OR[facility];  Service: Urology;  Laterality: N/A;   UMBILICAL HERNIA REPAIR N/A 10/30/2019   Procedure: HERNIA REPAIR UMBILICAL ADULT;  Surgeon: Hollice Espy, MD;  Location: [facility] OR[facility];  Service: Urology;  Laterality: N/A;    Family History  Problem Relation Age of Onset   Diabetes Brother    Kidney disease Brother     No Known Allergies  CBC Latest Ref Rng & Units 05/10/2021 10/02/2020 05/02/2020  WBC 4.0 - 10.5 K/uL 7.1 5.2 6.2  Hemoglobin 13.0 -  17.0 g/dL 11.5(L) 11.9(L) 14.5  Hematocrit 39.0 - 52.0 % 34.4(L) 34.8(L) 42.3  Platelets 150 - 400 K/uL 269 191 189      CMP     Component Value Date/Time   NA 139 05/10/2021 1656   NA 139 05/11/2014 0507   K 4.0 05/10/2021 1656   K 3.5 05/11/2014 0507   CL 105 05/10/2021 1656   CL 106 05/11/2014 0507   CO2 25 05/10/2021 1656   CO2 26 05/11/2014 0507   GLUCOSE 149 (H) 05/10/2021 1656   GLUCOSE 137 (H) 05/11/2014 0507   BUN 22 05/10/2021 1656   BUN 10 05/11/2014 0507   CREATININE 1.24 05/10/2021 1656   CREATININE 1.07 05/11/2014 0507   CALCIUM 9.0 05/10/2021 1656   CALCIUM 8.6 05/11/2014 0507   PROT 7.5 05/10/2021 1656   PROT 7.1 05/10/2014 0937   ALBUMIN 3.8 05/10/2021 1656   ALBUMIN 3.2 (L) 05/10/2014 0937   AST 16 05/10/2021 1656   AST 26 05/10/2014 0937   ALT 13 05/10/2021 1656   ALT 19 05/10/2014 0937   ALKPHOS 67 05/10/2021 1656   ALKPHOS 61 05/10/2014 0937   BILITOT 0.6 05/10/2021 1656   BILITOT 1.1 (H) 05/10/2014 0937   GFRNONAA >60 05/10/2021 1656   GFRNONAA >60 05/11/2014 0507   GFRAA >60 04/21/2020 0059   GFRAA >60 05/11/2014 0507     VAS [place] ABI WITH/WO TBI  Result Date: 06/19/2021  LOWER EXTREMITY DOPPLER STUDY Patient Name:  Allen Massey  Date of Exam:   06/16/2021 Medical Rec #: [id number]       Accession #:    [id number] Date of Birth: Oct 15, 1950       Patient Gender: M Patient Age:   75 years Exam Location:  [facility] Procedure:      VAS [place] ABI WITH/WO TBI Referring Phys: Arna Medici Kentrell Hallahan --------------------------------------------------------------------------------  Indications: Peripheral artery disease.  Comparison Study: 12/25/2020 Performing Technologist: Almira Coaster RVS  Examination Guidelines: A complete evaluation includes at minimum, Doppler waveform signals and systolic blood pressure reading at the level of bilateral brachial, anterior tibial, and posterior tibial arteries, when vessel segments are accessible. Bilateral testing  is considered an integral part of a complete examination. Photoelectric Plethysmograph (PPG) waveforms and toe systolic pressure readings are included as required and additional duplex testing as needed. Limited examinations for reoccurring indications may be performed as noted.  ABI Findings: +---------+------------------+-----+----------+--------+ Right    Rt Pressure (mmHg)IndexWaveform  Comment  +---------+------------------+-----+----------+--------+ Brachial 156                                       +---------+------------------+-----+----------+--------+  ATA      109                    monophasic.67      +---------+------------------+-----+----------+--------+ PTA      150               0.93 monophasic         +---------+------------------+-----+----------+--------+ Great Toe48                0.30 Abnormal           +---------+------------------+-----+----------+--------+ +---------+------------------+-----+----------+-------+ Left     Lt Pressure (mmHg)IndexWaveform  Comment +---------+------------------+-----+----------+-------+ Brachial 162                                      +---------+------------------+-----+----------+-------+ ATA      104                    monophasic.64     +---------+------------------+-----+----------+-------+ PTA      100               0.62 monophasic        +---------+------------------+-----+----------+-------+ Great Toe78                0.48 Abnormal          +---------+------------------+-----+----------+-------+ +-------+-----------+-----------+------------+------------+ ABI/TBIToday's ABIToday's TBIPrevious ABIPrevious TBI +-------+-----------+-----------+------------+------------+ Right  .93        .30        .82         .41          +-------+-----------+-----------+------------+------------+ Left   .64        .48        .82         .73           +-------+-----------+-----------+------------+------------+ Bilateral TBIs appear decreased compared to prior study on 12/25/2020. Right ABIs appear increased compared to prior study on 12/25/2020. Lt ABIs appear decreased compared to prior study on 12/25/2020  Summary: Right: Resting right ankle-brachial index indicates mild right lower extremity arterial disease. The right toe-brachial index is abnormal. Left: Resting left ankle-brachial index indicates moderate left lower extremity arterial disease. The left toe-brachial index is abnormal.  *See table(s) above for measurements and observations.  Electronically signed by Hortencia Pilar MD on 06/19/2021 at 7:07:41 PM.    Final        Assessment & Plan:   1. 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  The patient does not voice lifestyle limiting changes at this point in time.  Noninvasive studies do not suggest clinically significant change.  No invasive studies, angiography or surgery at this time The patient should continue walking and begin a more formal exercise program.  The patient should continue antiplatelet therapy and aggressive treatment of the lipid abnormalities  No changes in the patient's medications at this time  The patient should continue wearing graduated compression socks 10-15 mmHg strength to control the mild edema.    Patient return in 6 months with noninvasive studies. - VAS [place] ABI WITH/WO TBI; Future  2. Bilateral carotid artery stenosis The patient has no worsening symptoms.  We will continue with annual follow-up of carotid arteries.  3. Essential hypertension Continue antihypertensive medications as already ordered, these medications have been reviewed and there are no changes at  this time.   4. Type 2 diabetes mellitus with diabetic peripheral angiopathy without gangrene,  without long-term current use of insulin ([place]) Continue hypoglycemic medications as already ordered, these medications have been reviewed and there are no changes at this time.  Hgb A1C to be monitored as already arranged by primary service   5. Smoking Smoking cessation was discussed, 3-10 minutes spent on this topic specifically    Current Outpatient Medications on File Prior to Visit  Medication Sig Dispense Refill   aspirin EC 81 MG tablet Take 81 mg by mouth daily.     cilostazol (PLETAL) 100 MG tablet Take 1 tablet (100 mg total) by mouth 2 (two) times daily. 60 tablet 5   metFORMIN (GLUCOPHAGE) 1000 MG tablet Take 1,000 mg by mouth 2 (two) times daily.      phenazopyridine (PYRIDIUM) 95 MG tablet Take 1 tablet (95 mg total) by mouth 3 (three) times daily as needed for pain. 10 tablet 0   sildenafil (REVATIO) 20 MG tablet Take 1 tablet (20 mg total) by mouth as needed. Take 1-5 tabs as needed prior to intercourse 30 tablet 11   atorvastatin (LIPITOR) 80 MG tablet Take 1 tablet (80 mg total) by mouth daily. 90 tablet 3   brimonidine (ALPHAGAN) 0.2 % ophthalmic solution Place 1 drop into both eyes 3 (three) times daily. (Patient not taking: Reported on 06/16/2021)     dorzolamide-timolol (COSOPT) 22.3-6.8 MG/ML ophthalmic solution Place 1 drop into both eyes 2 (two) times daily.  (Patient not taking: Reported on 06/16/2021)     latanoprost (XALATAN) 0.005 % ophthalmic solution Place 1 drop into both eyes at bedtime.  (Patient not taking: Reported on 06/16/2021)     losartan (COZAAR) 25 MG tablet Take 25 mg by mouth every morning.  (Patient not taking: Reported on 06/16/2021)     nystatin-triamcinolone ointment (MYCOLOG) Apply 1 application topically 2 (two) times daily. (Patient not taking: Reported on 06/16/2021) 30 g 0   No current facility-administered medications on file prior to visit.    There are no Patient Instructions on file for this visit. No follow-ups on file.   Kris Hartmann, NP

## 2021-06-23 ENCOUNTER — Ambulatory Visit: Payer: Medicare Other | Admitting: Cardiology

## 2021-06-24 ENCOUNTER — Encounter: Payer: Self-pay | Admitting: Cardiology

## 2021-07-08 NOTE — Progress Notes (Signed)
Cardiology Office Note:    Date:  07/10/2021   ID:  Allen Massey, DOB 08-03-1951, MRN [id number]  PCP:  [facility] Cardiologist:  Kate Sable, MD  [place] Electrophysiologist:  None   Referring MD: [facility]*   Chief Complaint: 2 month follow-up  History of Present Illness:    Allen Massey is a 70 y.o. male with a hx of 3V coronary artery calcification by chest CT 11/2020, HTN, HLD, DM2, current smoker, alcohol use who presents for 2 month follow-up.   Noncontrast CT 11/2020 showed 3V coronary artery calcification. Echo 04/2021 showed normal EF 60-65%, impaired relaxation aortic valve sclerosis without stenosis. Lexiscan Myoview 03/2021 showed no evidence of ischemia, low risk.   Last seen 04/21/21 and reported chest pain consistent with angina. Advised patient follow-up with pulmonary medicine regarding dyspnea on exertion. May consider heart cath if symptoms persist. Patient was started on Cilostazol for claudication symptoms.  Today, the patient reports he saw the lung doctors, but do not see a note. He denies any chest pain. He said he is still short of breath. It is worse when he walks. No orthopnea. He can tolerate daily activities, he feels short of breath after a quarter of a mile. Also has pain in his legs when he walk, this unchanged. No LLE, palpitations, lightheadedness, dizziness. Patient smokes, 2 cigarettes a day. He drinks gin and beer, drinks half a fifth (752ml). Drinks three times a week. Lives with a roommate. He is able to take care of himself ok.    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   Diabetes mellitus without complication ([place])    Hyperlipidemia    Hypertension    Stroke [facility]) 1990'S   MINI STROKE    Past Surgical History:  Procedure Laterality Date   Colon cancer removal  10/30/2019   NO PAST SURGERIES     PELVIC LYMPH NODE DISSECTION Bilateral 10/30/2019    Procedure: PELVIC LYMPH NODE DISSECTION;  Surgeon: Hollice Espy, MD;  Location: [facility] ORS;  Service: Urology;  Laterality: Bilateral;   ROBOT ASSISTED LAPAROSCOPIC RADICAL PROSTATECTOMY N/A 10/30/2019   Procedure: XI ROBOTIC ASSISTED LAPAROSCOPIC RADICAL PROSTATECTOMY;  Surgeon: Hollice Espy, MD;  Location: [facility] ORS;  Service: Urology;  Laterality: N/A;   UMBILICAL HERNIA REPAIR N/A 10/30/2019   Procedure: HERNIA REPAIR UMBILICAL ADULT;  Surgeon: Hollice Espy, MD;  Location: [facility] ORS;  Service: Urology;  Laterality: N/A;    Current Medications: Current Meds  Medication Sig   atorvastatin (LIPITOR) 80 MG tablet Take 1 tablet (80 mg total) by mouth daily.   brimonidine (ALPHAGAN) 0.2 % ophthalmic solution Place 1 drop into both eyes 3 (three) times daily.   carvedilol (COREG) 6.25 MG tablet Take 1 tablet (6.25 mg total) by mouth 2 (two) times daily.   cilostazol (PLETAL) 100 MG tablet Take 1 tablet (100 mg total) by mouth 2 (two) times daily.   dorzolamide-timolol (COSOPT) 22.3-6.8 MG/ML ophthalmic solution Place 1 drop into both eyes 2 (two) times daily.   latanoprost (XALATAN) 0.005 % ophthalmic solution Place 1 drop into both eyes at bedtime.   losartan (COZAAR) 25 MG tablet Take 25 mg by mouth every morning.   metFORMIN (GLUCOPHAGE) 1000 MG tablet Take 1,000 mg by mouth 2 (two) times daily.    nitroGLYCERIN (NITROSTAT) 0.4 MG SL tablet Place 1 tablet (0.4 mg total) under the tongue every 5 (five) minutes as needed for chest pain.  nystatin-triamcinolone ointment (MYCOLOG) Apply 1 application topically 2 (two) times daily.   phenazopyridine (PYRIDIUM) 95 MG tablet Take 1 tablet (95 mg total) by mouth 3 (three) times daily as needed for pain.     Allergies:   Patient has no known allergies.   Social History   Socioeconomic History   Marital status: Married    Spouse name: Not on file   Number of children: Not on file   Years of education: Not on file   Highest education level:  Not on file  Occupational History   Not on file  Tobacco Use   Smoking status: Some Days    Packs/day: 0.25    Years: 48.00    Pack years: 12.00    Types: Cigarettes   Smokeless tobacco: Never  Vaping Use   Vaping Use: Never used  Substance and Sexual Activity   Alcohol use: Yes    Comment: OCC   Drug use: No   Sexual activity: Not Currently    Birth control/protection: None  Other Topics Concern   Not on file  Social History Narrative   Not on file   Social Determinants of Health   Financial Resource Strain: Not on file  Food Insecurity: Not on file  Transportation Needs: Not on file  Physical Activity: Not on file  Stress: Not on file  Social Connections: Not on file     Family History: The patient's family history includes Diabetes in his brother; Kidney disease in his brother.  ROS:   Please see the history of present illness.     All other systems reviewed and are negative.  EKGs/Labs/Other Studies Reviewed:    The following studies were reviewed today:  Echo 04/2021  1. Left ventricular ejection fraction, by estimation, is 60 to 65%. The  left ventricle has normal function. The left ventricle has no regional  wall motion abnormalities. Left ventricular diastolic parameters are  consistent with Grade I diastolic  dysfunction (impaired relaxation). The average left ventricular global  longitudinal strain is -16.2 %. The global longitudinal strain is normal.   2. Right ventricular systolic function is normal. The right ventricular  size is normal. There is normal pulmonary artery systolic pressure.   3. The mitral valve is normal in structure. No evidence of mitral valve  regurgitation. No evidence of mitral stenosis.   4. The aortic valve is normal in structure. Aortic valve regurgitation is  not visualized. Mild to moderate aortic valve sclerosis/calcification is  present, without any evidence of aortic stenosis.   5. The inferior vena cava is normal in  size with greater than 50%  respiratory variability, suggesting right atrial pressure of 3 mmHg.   Myoview Lexiscan 03/2021 Narrative & Impression  Pharmacological myocardial perfusion imaging study with no significant  ischemia Normal wall motion, EF estimated at 63% GI uptake artifact noted No EKG changes concerning for ischemia at peak stress or in recovery. CT attenuation correction images with significant coronary calcification noted notably in LAD, minimal in the aorta Low risk scan     Signed, Esmond Plants, MD, Ph.D [facility]     ABI 06/2021 Bilateral TBIs appear decreased compared to prior study on 12/25/2020.  Right ABIs appear increased compared to prior study on 12/25/2020. Lt ABIs  appear decreased compared to prior study on 12/25/2020     Summary:  Right: Resting right ankle-brachial index indicates mild right lower  extremity arterial disease. The right toe-brachial index is abnormal.   Left: Resting left ankle-brachial  index indicates moderate left lower  extremity arterial disease. The left toe-brachial index is abnormal.   EKG:  EKG is ordered today.  The ekg ordered today demonstrates NSR, 88bpm, LVH, nonspecific ST/T wave changes  Recent Labs: 05/10/2021: ALT 13; BUN 22; Creatinine, Ser 1.24; Hemoglobin 11.5; Platelets 269; Potassium 4.0; Sodium 139  Recent Lipid Panel    Component Value Date/Time   CHOL 152 04/21/2021 1040   CHOL 162 05/11/2014 0507   TRIG 72 04/21/2021 1040   TRIG 98 05/11/2014 0507   HDL 50 04/21/2021 1040   HDL 37 (L) 05/11/2014 0507   CHOLHDL 3.0 04/21/2021 1040   CHOLHDL 5.6 02/25/2021 1134   VLDL 42 (H) 02/25/2021 1134   VLDL 20 05/11/2014 0507   LDLCALC 88 04/21/2021 1040   LDLCALC 105 (H) 05/11/2014 0507    Physical Exam:    VS:  BP (!) 144/80 (BP Location: Left Arm, Patient Position: Sitting, Cuff Size: Normal)   Pulse 88   Ht 5\' 6"  (1.676 m)   Wt 165 lb (74.8 kg)   SpO2 97%   BMI 26.63 kg/m     Wt Readings  from Last 3 Encounters:  07/10/21 165 lb (74.8 kg)  06/16/21 166 lb (75.3 kg)  05/16/21 160 lb (72.6 kg)     GEN:  Well nourished, well developed in no acute distress HEENT: Normal NECK: No JVD; No carotid bruits LYMPHATICS: No lymphadenopathy CARDIAC: RRR, no murmurs, rubs, gallops RESPIRATORY:  Clear to auscultation without rales, wheezing or rhonchi  ABDOMEN: Soft, non-tender, non-distended MUSCULOSKELETAL:  No edema; No deformity  SKIN: Warm and dry NEUROLOGIC:  Alert and oriented x 3 PSYCHIATRIC:  Normal affect   ASSESSMENT:    1. Coronary artery disease involving native coronary artery of native heart, unspecified whether angina present   2. Primary hypertension   3. Hyperlipidemia, mixed   4. Claudication ([place])   5. Shortness of breath   6. Dyspnea on exertion    PLAN:    In order of problems listed above:  3V CAD Patient denies further chest pain, but reports persistent dyspnea on exertion. This is unchanged. He says he saw pulmonology, but do not see a note. EKG shows SR with nonspecific ST/T wave changes. Cardiac cath was discussed in detail, but patient would like to wait at this time. I will refer to pulmonology and we will see him back in 2 months to re-assess symptoms and re-visit cardiac cath is necessary. Re-start Aspirin 81mg  daily (he previously self-stopped this). Continue statin. I will start Coreg. Will send in SL NTG.   HTN Mildly elevated today. I will start Coreg 6.25mg  BID as above. Continue Losartan  HLD LDL 88 04/2021. Continue statin.   Claudication Repots stable claudication symptoms. Following with [facility]. Continue Pletal.   Tobacco use Reports smoking 2 cigarettes daily. Complete cessation recommended. Will refer to pulmonology as above.   Alcohol use Reports drinking half a fifth three times a week. Complete cessation recommended.   Disposition: Follow up in 2 month(s) with MD     Signed, Shacoria Latif Ninfa Meeker, PA-C  07/10/2021 10:13 AM     [facility]

## 2021-07-10 ENCOUNTER — Encounter: Payer: Self-pay | Admitting: Medical

## 2021-07-10 ENCOUNTER — Other Ambulatory Visit: Payer: Self-pay

## 2021-07-10 ENCOUNTER — Ambulatory Visit (INDEPENDENT_AMBULATORY_CARE_PROVIDER_SITE_OTHER): Payer: Medicare Other | Admitting: Medical

## 2021-07-10 VITALS — BP 144/80 | HR 88 | Ht 66.0 in | Wt 165.0 lb

## 2021-07-10 DIAGNOSIS — I739 Peripheral vascular disease, unspecified: Secondary | ICD-10-CM

## 2021-07-10 DIAGNOSIS — R0609 Other forms of dyspnea: Secondary | ICD-10-CM

## 2021-07-10 DIAGNOSIS — E782 Mixed hyperlipidemia: Secondary | ICD-10-CM | POA: Diagnosis not present

## 2021-07-10 DIAGNOSIS — I10 Essential (primary) hypertension: Secondary | ICD-10-CM | POA: Diagnosis not present

## 2021-07-10 DIAGNOSIS — R0602 Shortness of breath: Secondary | ICD-10-CM

## 2021-07-10 DIAGNOSIS — I2510 Atherosclerotic heart disease of native coronary artery without angina pectoris: Secondary | ICD-10-CM

## 2021-07-10 MED ORDER — NITROGLYCERIN 0.4 MG SL SUBL: 0.4000 mg | SUBLINGUAL_TABLET | SUBLINGUAL | 3 refills | Status: AC | PRN

## 2021-07-10 MED ORDER — ASPIRIN EC 81 MG PO TBEC: 81.0000 mg | DELAYED_RELEASE_TABLET | Freq: Every day | ORAL | Status: AC

## 2021-07-10 MED ORDER — CARVEDILOL 6.25 MG PO TABS: 6.2500 mg | ORAL_TABLET | Freq: Two times a day (BID) | ORAL | 1 refills | Status: DC

## 2021-07-10 NOTE — Patient Instructions (Addendum)
Medication Instructions:  -Your physician has recommended you make the following change in your medication:   1) RESTART aspirin 81 mg: - take 1 tablet by mouth ONCE daily  2) START coreg (carvedilol) 6.25 mg: - take 1 tablet by mouth TWICE daily   3) START nitroglycerin 0.4 mg: - place 1 tablet under the tongue every 5 minutes up to 3 doses as needed for chest pain  *If you need a refill on your cardiac medications before your next appointment, please call your pharmacy*   Lab Work: - none ordered  If you have labs (blood work) drawn today and your tests are completely normal, you will receive your results only by: MyChart Message (if you have MyChart) OR A paper copy in the mail If you have any lab test that is abnormal or we need to change your treatment, we will call you to review the results.   Testing/Procedures: - You have been referred to [facility]- for shortness of breath  - They will contact you directly from the Pulmonary office to schedule an appointment. If you do not hear from them with 1.5-2 weeks, then please call [phone] to schedule.     Follow-Up: At [facility], you and your health needs are our priority.  As part of our continuing mission to provide you with exceptional heart care, we have created designated Provider Care Teams.  These Care Teams include your primary Cardiologist (physician) and Advanced Practice Providers (APPs -  Physician Assistants and Nurse Practitioners) who all work together to provide you with the care you need, when you need it.  We recommend signing up for the patient portal called "MyChart".  Sign up information is provided on this After Visit Summary.  MyChart is used to connect with patients for Virtual Visits (Telemedicine).  Patients are able to view lab/test results, encounter notes, upcoming appointments, etc.  Non-urgent messages can be sent to your provider as well.   To learn more about what you can do with  MyChart, go to [email or web address].    Your next appointment:   2 month(s)  The format for your next appointment:   In Person  Provider:   Kate Sable, MD   Other Instructions N/a

## 2021-07-11 ENCOUNTER — Encounter: Payer: Self-pay | Admitting: Urology

## 2021-07-30 ENCOUNTER — Institutional Professional Consult (permissible substitution): Payer: Medicare Other | Admitting: Internal Medicine

## 2021-07-30 NOTE — Progress Notes (Deleted)
   Allen Massey, male    DOB: [birth year], 70 y.o.   MRN: [id number]   Brief patient profile:  68 yobm ***  referred to pulmonary clinic in [facility]  07/30/2021 by Dr Marland Kitchen  for ***         History of Present Illness  07/30/2021  Pulmonary/ 1st office eval/ Layce Sprung / [employer]  No chief complaint on file.    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     atorvastatin (LIPITOR) 80 MG tablet Take 1 tablet (80 mg total) by mouth daily. 90 tablet 3   brimonidine (ALPHAGAN) 0.2 % ophthalmic solution Place 1 drop into both eyes 3 (three) times daily.     carvedilol (COREG) 6.25 MG tablet Take 1 tablet (6.25 mg total) by mouth 2 (two) times daily. 180 tablet 1   cilostazol (PLETAL) 100 MG tablet Take 1 tablet (100 mg total) by mouth 2 (two) times daily. 60 tablet 5   dorzolamide-timolol (COSOPT) 22.3-6.8 MG/ML ophthalmic solution Place 1 drop into both eyes 2 (two) times daily.     latanoprost (XALATAN) 0.005 % ophthalmic solution Place 1 drop into both eyes at bedtime.     losartan (COZAAR) 25 MG tablet Take 25 mg by mouth every morning.     metFORMIN (GLUCOPHAGE) 1000 MG tablet Take 1,000 mg by mouth 2 (two) times daily.      nitroGLYCERIN (NITROSTAT) 0.4 MG SL tablet Place 1 tablet (0.4 mg total) under the tongue every 5 (five) minutes as needed for chest pain. 25 tablet 3   nystatin-triamcinolone ointment (MYCOLOG) Apply 1 application topically 2 (two) times daily. 30 g 0   phenazopyridine (PYRIDIUM) 95 MG tablet Take 1 tablet (95 mg total) by mouth 3 (three) times daily as needed for pain. 10 tablet 0   sildenafil (REVATIO) 20 MG tablet Take 1  tablet (20 mg total) by mouth as needed. Take 1-5 tabs as needed prior to intercourse (Patient not taking: Reported on 07/10/2021) 30 tablet 11   No facility-administered medications prior to visit.     Objective:     There were no vitals taken for this visit.      I personally reviewed images and agree with radiology impression as follows:   Chest CT 11/14/20 LDSCT  Mildcentrilobular and paraseptal emphysema with mild diffuse bronchial wall thickening    Assessment   No problem-specific Assessment & Plan notes found for this encounter.     Christinia Gully, MD 07/30/2021

## 2021-09-19 ENCOUNTER — Ambulatory Visit: Payer: Medicare Other | Admitting: Cardiology

## 2021-10-24 ENCOUNTER — Other Ambulatory Visit: Payer: Self-pay | Admitting: *Deleted

## 2021-10-24 DIAGNOSIS — C61 Malignant neoplasm of prostate: Secondary | ICD-10-CM

## 2021-10-27 ENCOUNTER — Inpatient Hospital Stay: Payer: Medicare Other | Attending: Radiation Oncology

## 2021-10-27 ENCOUNTER — Ambulatory Visit: Payer: Medicare Other | Admitting: Radiation Oncology

## 2021-12-11 ENCOUNTER — Emergency Department: Payer: Medicare Other

## 2021-12-11 ENCOUNTER — Encounter: Payer: Self-pay | Admitting: Emergency Medicine

## 2021-12-11 ENCOUNTER — Emergency Department
Admission: EM | Admit: 2021-12-11 | Discharge: 2021-12-11 | Disposition: A | Discharge status: Home / Self Care | Payer: Medicare Other | Attending: Emergency Medicine | Admitting: Emergency Medicine

## 2021-12-11 ENCOUNTER — Other Ambulatory Visit: Payer: Self-pay

## 2021-12-11 DIAGNOSIS — E119 Type 2 diabetes mellitus without complications: Secondary | ICD-10-CM | POA: Insufficient documentation

## 2021-12-11 DIAGNOSIS — F17210 Nicotine dependence, cigarettes, uncomplicated: Secondary | ICD-10-CM | POA: Diagnosis not present

## 2021-12-11 DIAGNOSIS — M549 Dorsalgia, unspecified: Secondary | ICD-10-CM | POA: Diagnosis present

## 2021-12-11 DIAGNOSIS — M19011 Primary osteoarthritis, right shoulder: Secondary | ICD-10-CM | POA: Insufficient documentation

## 2021-12-11 DIAGNOSIS — R030 Elevated blood-pressure reading, without diagnosis of hypertension: Secondary | ICD-10-CM

## 2021-12-11 DIAGNOSIS — M5030 Other cervical disc degeneration, unspecified cervical region: Secondary | ICD-10-CM | POA: Diagnosis not present

## 2021-12-11 DIAGNOSIS — I10 Essential (primary) hypertension: Secondary | ICD-10-CM | POA: Diagnosis not present

## 2021-12-11 MED ORDER — HYDROCODONE-ACETAMINOPHEN 5-325 MG PO TABS: 1.0000 | ORAL_TABLET | Freq: Four times a day (QID) | ORAL | 0 refills | Status: AC | PRN

## 2021-12-11 MED ORDER — MELOXICAM 7.5 MG PO TABS: 7.5000 mg | ORAL_TABLET | Freq: Every day | ORAL | 2 refills | Status: AC

## 2021-12-11 MED ORDER — HYDROCODONE-ACETAMINOPHEN 5-325 MG PO TABS
1.0000 | ORAL_TABLET | Freq: Once | ORAL | Status: AC
  Administered 2021-12-11: 12:00:00 1 via ORAL
  Filled 2021-12-11: qty 1

## 2021-12-11 NOTE — ED Provider Notes (Signed)
? ?[facility] ?Provider Note ? ? ? Event Date/Time  ? First MD Initiated Contact with Patient 12/11/21 1134   ?  (approximate) ? ? ?History  ? ?Shoulder Pain ? ? ?HPI ? ?Allen Massey is a 71 y.o. male presents to the ED with complaint of neck and right shoulder pain for approximately 1 month.  Patient denies any injury and has not been taking any over-the-counter medication as he thought he would "wait it out".  His pain is not improved during this time.  Patient has history of diabetes, hypertension, hyperlipidemia, stroke in the 1990s and continues to smoke 1/4 pack cigarettes per day. ?  ? ? ?Physical Exam  ? ?Triage Vital Signs: ?ED Triage Vitals  ?Enc Vitals Group  ?   BP 12/11/21 1123 (!) 155/100  ?   Pulse Rate 12/11/21 1123 98  ?   Resp 12/11/21 1123 18  ?   Temp 12/11/21 1123 97.7 ?F (36.5 ?C)  ?   Temp Source 12/11/21 1123 Oral  ?   SpO2 12/11/21 1123 98 %  ?   Weight 12/11/21 1121 155 lb (70.3 kg)  ?   Height 12/11/21 1121 '5\' 6"'$  (1.676 m)  ?   Head Circumference --   ?   Peak Flow --   ?   Pain Score 12/11/21 1121 7  ?   Pain Loc --   ?   Pain Edu? --   ?   Excl. in [place]? --   ? ? ?Most recent vital signs: ?Vitals:  ? 12/11/21 1123 12/11/21 1346  ?BP: (!) 155/100 (!) 150/88  ?Pulse: 98 90  ?Resp: 18 18  ?Temp: 97.7 ?F (36.5 ?C)   ?SpO2: 98% 99%  ? ? ? ?General: Awake, no distress.  ?CV:  Good peripheral perfusion.  Heart regular rate and rhythm. ?Resp:  Normal effort.  Clear bilaterally. ?Abd:  No distention.  ?Other:  There is tenderness on palpation of the cervical spine posteriorly.  No step-offs are appreciated.  Range of motion is slow and guarded secondary to increased pain.  There is also tenderness to the right trapezius and rhomboid muscle.  No crepitus is noted with range of motion of the right shoulder.  No warmth or redness is noted.  No evidence of injury present.  Radial pulse distally is present. ? ? ?ED Results / Procedures / Treatments  ? ?Labs ?(all labs ordered  are listed, but only abnormal results are displayed) ?Labs Reviewed - No data to display ? ? ? ?RADIOLOGY ? ?Cervical spine x-ray was reviewed by myself with multilevel degenerative changes noted.  Right shoulder no fracture but osteoarthritis present.  Radiology reports were reviewed with degenerative disc disease noted multilevels with the worst being at C4 and C5 and C5-C6.  Also anterior spurring noted. ? ?Right shoulder x-ray also showed osteoarthritis and radiology report reviewed and confirmed. ? ? ?PROCEDURES: ? ?Critical Care performed:  ? ?Procedures ? ? ?MEDICATIONS ORDERED IN ED: ?Medications  ?HYDROcodone-acetaminophen (NORCO/VICODIN) 5-325 MG per tablet 1 tablet (1 tablet Oral Given 12/11/21 1222)  ? ? ? ?IMPRESSION / MDM / ASSESSMENT AND PLAN / ED COURSE  ?I reviewed the triage vital signs and the nursing notes. ? ? ?Differential diagnosis includes, but is not limited to, degenerative disc disease, cervical pain with radiculopathy, ? ? ?71 year old male presents to the ED with complaint of cervical and right shoulder pain for 1 month without history of injury.  Patient has taken occasional Tylenol but no  other over-the-counter medications and states he got no relief with it.  Physical exam did show tenderness on palpation of the cervical spine posteriorly and the right trapezius muscle.  No crepitus was appreciated.  No evidence of injury present.  X-rays confirmed degenerative disc disease cervical spine and also osteoarthritis right shoulder.  Patient was given hydrocodone prior to going to x-ray and states that it has eased his pain slightly.  He reports that he is not drowsy on this medication.  A prescription for hydrocodone and meloxicam was sent to his pharmacy.  He is to follow-up with his PCP who also may make referral for him to see a specialist in the future.  I spoke with patient about his hypertension as he did not take his blood pressure medication today but does take it usually on a daily  basis. ? ? ?  ? ? ?FINAL CLINICAL IMPRESSION(S) / ED DIAGNOSES  ? ?Final diagnoses:  ?Degenerative disc disease, cervical  ?Osteoarthritis of right shoulder, unspecified osteoarthritis type  ?Elevated blood pressure reading  ? ? ? ?Rx / DC Orders  ? ?ED Discharge Orders   ? ?      Ordered  ?  HYDROcodone-acetaminophen (NORCO/VICODIN) 5-325 MG tablet  Every 6 hours PRN       ? 12/11/21 1327  ?  meloxicam (MOBIC) 7.5 MG tablet  Daily       ? 12/11/21 1332  ? ?  ?  ? ?  ? ? ? ?Note:  This document was prepared using Dragon voice recognition software and may include unintentional dictation errors. ?  ?Johnn Hai, PA-C ?12/11/21 1536 ? ?  ?Duffy Bruce, MD ?12/13/21 [phone] ? ?

## 2021-12-11 NOTE — ED Triage Notes (Signed)
Pt via POV from home. Pt c/o L shoulder and neck pain for approx a month. Pt is A&OX4 and NAD ?

## 2021-12-11 NOTE — ED Notes (Signed)
See triage note  presents with left shoulder pain   states pain started about 1 month ago also having some neck pain  denies any injury  no deformity noted  good pulses ?

## 2021-12-11 NOTE — Discharge Instructions (Addendum)
Call make an appointment with your primary care provider for further evaluation and medication management.  Also take your blood pressure medication every day as it was elevated in the emergency department today.  A prescription for arthritis and also for pain was sent to your pharmacy.  Begin taking the meloxicam 1 daily with food which is for arthritis.  The hydrocodone is for pain and should not be taken if you plan on driving or operating machinery as it could cause drowsiness and increase your risk for falling. ?

## 2021-12-21 NOTE — Progress Notes (Deleted)
? ? ?MRN : [id number] ? ?Allen Massey is a 71 y.o. (22-Jan-1951) male who presents with chief complaint of check carotid arteries. ? ?History of Present Illness:  ? ?The patient is seen for follow up evaluation of carotid stenosis. The carotid stenosis followed by ultrasound.  ? ?The patient denies amaurosis fugax. There is no recent history of TIA symptoms or focal motor deficits. There is no prior documented CVA. ? ?The patient is taking enteric-coated aspirin 81 mg daily. ? ?There is no history of migraine headaches. There is no history of seizures. ? ?The patient is also followed for PAD.  There have been no interval changes in lower extremity symptoms. No interval shortening of the patient's claudication distance or development of rest pain symptoms. No new ulcers or wounds have occurred since the last visit. ?  ?There have been no significant changes to the patient's overall health care. ?  ?The patient denies history of DVT, PE or superficial thrombophlebitis. ?The patient has a history of coronary artery disease, no recent episodes of angina or shortness of breath. ?There is a history of hyperlipidemia which is being treated with a statin.   ? ?Carotid Duplex done today shows ***.  No change compared to last study in ***  ? ?ABI Rt=0.93 and Lt=0.64  (previous ABI's Rt=0.82 and Lt=0.82) ?Duplex ultrasound of the bilateral tibial arteries reveals strong monophasic waveforms with good toe waveforms bilaterally ? ?No outpatient medications have been marked as taking for the 12/22/21 encounter (Appointment) with Delana Meyer, Dolores Lory, MD.  ? ? ?Past Medical History:  ?Diagnosis Date  ? Cancer [facility])   ? PROSTATE  ? Diabetes mellitus without complication ([place])   ? Hyperlipidemia   ? Hypertension   ? Stroke [facility]) 1990'S  ? MINI STROKE  ? ? ?Past Surgical History:  ?Procedure Laterality Date  ? Colon cancer removal  10/30/2019  ? NO PAST SURGERIES    ? PELVIC LYMPH NODE DISSECTION Bilateral 10/30/2019  ? Procedure: PELVIC  LYMPH NODE DISSECTION;  Surgeon: Hollice Espy, MD;  Location: [facility] ORS;  Service: Urology;  Laterality: Bilateral;  ? ROBOT ASSISTED LAPAROSCOPIC RADICAL PROSTATECTOMY N/A 10/30/2019  ? Procedure: XI ROBOTIC ASSISTED LAPAROSCOPIC RADICAL PROSTATECTOMY;  Surgeon: Hollice Espy, MD;  Location: [facility] ORS;  Service: Urology;  Laterality: N/A;  ? UMBILICAL HERNIA REPAIR N/A 10/30/2019  ? Procedure: HERNIA REPAIR UMBILICAL ADULT;  Surgeon: Hollice Espy, MD;  Location: [facility] ORS;  Service: Urology;  Laterality: N/A;  ? ? ?Social History ?Social History  ? ?Tobacco Use  ? Smoking status: Some Days  ?  Packs/day: 0.25  ?  Years: 48.00  ?  Pack years: 12.00  ?  Types: Cigarettes  ? Smokeless tobacco: Never  ?Vaping Use  ? Vaping Use: Never used  ?Substance Use Topics  ? Alcohol use: Yes  ?  Comment: OCC  ? Drug use: No  ? ? ?Family History ?Family History  ?Problem Relation Age of Onset  ? Diabetes Brother   ? Kidney disease Brother   ? ? ?No Known Allergies ? ? ?REVIEW OF SYSTEMS (Negative unless checked) ? ?Constitutional: '[]'$ Weight loss  '[]'$ Fever  '[]'$ Chills ?Cardiac: '[]'$ Chest pain   '[]'$ Chest pressure   '[]'$ Palpitations   '[]'$ Shortness of breath when laying flat   '[]'$ Shortness of breath with exertion. ?Vascular:  '[]'$ Pain in legs with walking   '[]'$ Pain in legs at rest  '[]'$ History of DVT   '[]'$ Phlebitis   '[]'$ Swelling in legs   '[]'$ Varicose veins   '[]'$ Non-healing ulcers ?Pulmonary:   '[]'$   Uses home oxygen   '[]'$ Productive cough   '[]'$ Hemoptysis   '[]'$ Wheeze  '[]'$ COPD   '[]'$ Asthma ?Neurologic:  '[]'$ Dizziness   '[]'$ Seizures   '[]'$ History of stroke   '[]'$ History of TIA  '[]'$ Aphasia   '[]'$ Vissual changes   '[]'$ Weakness or numbness in arm   '[]'$ Weakness or numbness in leg ?Musculoskeletal:   '[]'$ Joint swelling   '[]'$ Joint pain   '[]'$ Low back pain ?Hematologic:  '[]'$ Easy bruising  '[]'$ Easy bleeding   '[]'$ Hypercoagulable state   '[]'$ Anemic ?Gastrointestinal:  '[]'$ Diarrhea   '[]'$ Vomiting  '[]'$ Gastroesophageal reflux/heartburn   '[]'$ Difficulty swallowing. ?Genitourinary:  '[]'$ Chronic kidney disease    '[]'$ Difficult urination  '[]'$ Frequent urination   '[]'$ Blood in urine ?Skin:  '[]'$ Rashes   '[]'$ Ulcers  ?Psychological:  '[]'$ History of anxiety   '[]'$  History of major depression. ? ?Physical Examination ? ?There were no vitals filed for this visit. ?There is no height or weight on file to calculate BMI. ?Gen: WD/WN, NAD ?Head: [place]/AT, No temporalis wasting.  ?Ear/Nose/Throat: Hearing grossly intact, nares w/o erythema or drainage ?Eyes: PER, EOMI, sclera nonicteric.  ?Neck: Supple, no masses.  No bruit or JVD.  ?Pulmonary:  Good air movement, no audible wheezing, no use of accessory muscles.  ?Cardiac: RRR, normal S1, S2, no Murmurs. ?Vascular:  *** ?Vessel Right Left  ?Radial Palpable Palpable  ?Carotid Palpable Palpable  ?PT Palpable Palpable  ?DP Palpable Palpable  ?Gastrointestinal: soft, non-distended. No guarding/no peritoneal signs.  ?Musculoskeletal: M/S 5/5 throughout.  No visible deformity.  ?Neurologic: CN 2-12 intact. Pain and light touch intact in extremities.  Symmetrical.  Speech is fluent. Motor exam as listed above. ?Psychiatric: Judgment intact, Mood & affect appropriate for pt's clinical situation. ?Dermatologic: No rashes or ulcers noted.  No changes consistent with cellulitis. ? ? ?CBC ?Lab Results  ?Component Value Date  ? WBC 7.1 05/10/2021  ? HGB 11.5 (L) 05/10/2021  ? HCT 34.4 (L) 05/10/2021  ? MCV 92.0 05/10/2021  ? PLT 269 05/10/2021  ? ? ?BMET ?   ?Component Value Date/Time  ? NA 139 05/10/2021 1656  ? NA 139 05/11/2014 0507  ? K 4.0 05/10/2021 1656  ? K 3.5 05/11/2014 0507  ? CL 105 05/10/2021 1656  ? CL 106 05/11/2014 0507  ? CO2 25 05/10/2021 1656  ? CO2 26 05/11/2014 0507  ? GLUCOSE 149 (H) 05/10/2021 1656  ? GLUCOSE 137 (H) 05/11/2014 0507  ? BUN 22 05/10/2021 1656  ? BUN 10 05/11/2014 0507  ? CREATININE 1.24 05/10/2021 1656  ? CREATININE 1.07 05/11/2014 0507  ? CALCIUM 9.0 05/10/2021 1656  ? CALCIUM 8.6 05/11/2014 0507  ? GFRNONAA >60 05/10/2021 1656  ? GFRNONAA >60 05/11/2014 0507  ? GFRAA >60  04/21/2020 0059  ? GFRAA >60 05/11/2014 0507  ? ?CrCl cannot be calculated (Patient's most recent lab result is older than the maximum 21 days allowed.). ? ?COAG ?Lab Results  ?Component Value Date  ? INR 0.9 10/26/2019  ? ? ?Radiology ?DG Cervical Spine 2-3 Views ? ?Result Date: 12/11/2021 ?CLINICAL DATA:  Pain.  No known injury.  Right neck pain. EXAM: CERVICAL SPINE - 2-3 VIEW COMPARISON:  08/22/2010 cervical spine radiographs FINDINGS: There is straightening of the normal cervical lordosis. 2 mm grade 1 anterolisthesis of C4 on C5 is unchanged minimally worsened from prior. Vertebral body heights are maintained. Large anterior C4-5 and C5-6 endplate osteophytes mildly worsened from remote prior radiographs. Minimal anterior C5-6 disc space narrowing. The atlantodens interval is intact. No prevertebral soft tissue swelling. IMPRESSION:: IMPRESSION: 1. Large anterior C4-5 and C5-6 endplate  osteophytes. 2. Mild C5-6 degenerative disc changes. Electronically Signed   By: Yvonne Kendall M.D.   On: 12/11/2021 13:19  ? ?DG Shoulder Right ? ?Result Date: 12/11/2021 ?CLINICAL DATA:  Right neck and right shoulder pain. EXAM: RIGHT SHOULDER - 2+ VIEW COMPARISON:  Right shoulder radiographs 08/22/2010 FINDINGS: Moderate inferior glenoid and mild inferior humeral head-neck junction degenerative osteophytosis. Mild-to-moderate anterior glenoid degenerative osteophytosis. Moderate acromioclavicular joint space narrowing and peripheral osteophytosis. No acute fracture or dislocation. IMPRESSION: Moderate glenohumeral and acromioclavicular osteoarthritis, mildly progressed from prior. Electronically Signed   By: Yvonne Kendall M.D.   On: 12/11/2021 13:17   ? ? ?Assessment/Plan ?There are no diagnoses linked to this encounter. ? ? ?Hortencia Pilar, MD ? ?12/21/2021 ?4:44 PM ? ?  ?

## 2021-12-22 ENCOUNTER — Encounter (INDEPENDENT_AMBULATORY_CARE_PROVIDER_SITE_OTHER): Payer: Medicare Other

## 2021-12-22 ENCOUNTER — Ambulatory Visit (INDEPENDENT_AMBULATORY_CARE_PROVIDER_SITE_OTHER): Payer: Medicare Other | Admitting: Vascular Surgery

## 2021-12-22 DIAGNOSIS — E1151 Type 2 diabetes mellitus with diabetic peripheral angiopathy without gangrene: Secondary | ICD-10-CM

## 2021-12-22 DIAGNOSIS — I10 Essential (primary) hypertension: Secondary | ICD-10-CM

## 2021-12-22 DIAGNOSIS — I6523 Occlusion and stenosis of bilateral carotid arteries: Secondary | ICD-10-CM

## 2021-12-22 DIAGNOSIS — I70219 Atherosclerosis of native arteries of extremities with intermittent claudication, unspecified extremity: Secondary | ICD-10-CM

## 2022-01-06 ENCOUNTER — Other Ambulatory Visit: Payer: Self-pay

## 2022-01-07 ENCOUNTER — Encounter: Payer: Self-pay | Admitting: Urology

## 2022-01-12 NOTE — Progress Notes (Incomplete)
? ?01/12/22 ?11:23 AM  ? ?[name] ?[birth year] ?[id number] ? ?Referring provider:  ?Center, [facility] ?[address] ?[place],  [place] [address] ?No chief complaint on file. ? ? ? ? ?HPI: ?Allen Massey is a 71 y.o.male with a personal history of prostate cancer, ED, epididymoorchitis  , and balanoposthitis, who presents today for a 1 year follow-up with PSA.  ? ?He is s/p prostatectomy on 10/30/2019. His surgical pathology report from 11/01/19 indicated gleason 3+4 with less than 5% of a tertiary pattern -EPE focal -SV +margin - lymph nodes pT2pN0.  Intraoperatively, residual tumor/prostate lon posterior plane.  Resection was subtotal.  Postoperatively, his PSA was detectable and subsequently rose.  He underwent 6 months of ADT and salvage radiation which he tolerated well.  His final radiation treatment was 05/2020 ? ? ? ? ? ?PMH: ?Past Medical History:  ?Diagnosis Date  ? Cancer [facility])   ? PROSTATE  ? Diabetes mellitus without complication ([place])   ? Hyperlipidemia   ? Hypertension   ? Stroke [facility]) 1990'S  ? MINI STROKE  ? ? ?Surgical History: ?Past Surgical History:  ?Procedure Laterality Date  ? Colon cancer removal  10/30/2019  ? NO PAST SURGERIES    ? PELVIC LYMPH NODE DISSECTION Bilateral 10/30/2019  ? Procedure: PELVIC LYMPH NODE DISSECTION;  Surgeon: Hollice Espy, MD;  Location: [facility] ORS;  Service: Urology;  Laterality: Bilateral;  ? ROBOT ASSISTED LAPAROSCOPIC RADICAL PROSTATECTOMY N/A 10/30/2019  ? Procedure: XI ROBOTIC ASSISTED LAPAROSCOPIC RADICAL PROSTATECTOMY;  Surgeon: Hollice Espy, MD;  Location: [facility] ORS;  Service: Urology;  Laterality: N/A;  ? UMBILICAL HERNIA REPAIR N/A 10/30/2019  ? Procedure: HERNIA REPAIR UMBILICAL ADULT;  Surgeon: Hollice Espy, MD;  Location: [facility] ORS;  Service: Urology;  Laterality: N/A;  ? ? ?Home Medications:  ?Allergies as of 01/13/2022   ?No Known Allergies ?  ? ?  ?Medication List  ?  ? ?  ? Accurate as of January 12, 2022 11:23 AM. If you have any  questions, ask your nurse or doctor.  ?  ?  ? ?  ? ?aspirin EC 81 MG tablet ?Take 1 tablet (81 mg total) by mouth daily. ?  ?atorvastatin 80 MG tablet ?Commonly known as: LIPITOR ?Take 1 tablet (80 mg total) by mouth daily. ?  ?brimonidine 0.2 % ophthalmic solution ?Commonly known as: ALPHAGAN ?Place 1 drop into both eyes 3 (three) times daily. ?  ?carvedilol 6.25 MG tablet ?Commonly known as: COREG ?Take 1 tablet (6.25 mg total) by mouth 2 (two) times daily. ?  ?cilostazol 100 MG tablet ?Commonly known as: PLETAL ?Take 1 tablet (100 mg total) by mouth 2 (two) times daily. ?  ?dorzolamide-timolol 22.3-6.8 MG/ML ophthalmic solution ?Commonly known as: COSOPT ?Place 1 drop into both eyes 2 (two) times daily. ?  ?HYDROcodone-acetaminophen 5-325 MG tablet ?Commonly known as: NORCO/VICODIN ?Take 1 tablet by mouth every 6 (six) hours as needed for moderate pain. ?  ?latanoprost 0.005 % ophthalmic solution ?Commonly known as: XALATAN ?Place 1 drop into both eyes at bedtime. ?  ?losartan 25 MG tablet ?Commonly known as: COZAAR ?Take 25 mg by mouth every morning. ?  ?meloxicam 7.5 MG tablet ?Commonly known as: Mobic ?Take 1 tablet (7.5 mg total) by mouth daily. ?  ?metFORMIN 1000 MG tablet ?Commonly known as: GLUCOPHAGE ?Take 1,000 mg by mouth 2 (two) times daily. ?  ?nitroGLYCERIN 0.4 MG SL tablet ?Commonly known as: NITROSTAT ?Place 1 tablet (0.4 mg total) under the tongue every 5 (five) minutes  as needed for chest pain. ?  ?nystatin-triamcinolone ointment ?Commonly known as: MYCOLOG ?Apply 1 application topically 2 (two) times daily. ?  ?phenazopyridine 95 MG tablet ?Commonly known as: PYRIDIUM ?Take 1 tablet (95 mg total) by mouth 3 (three) times daily as needed for pain. ?  ?sildenafil 20 MG tablet ?Commonly known as: Revatio ?Take 1 tablet (20 mg total) by mouth as needed. Take 1-5 tabs as needed prior to intercourse ?  ? ?  ? ? ?Allergies:  ?No Known Allergies ? ?Family History: ?Family History  ?Problem Relation Age  of Onset  ? Diabetes Brother   ? Kidney disease Brother   ? ? ?Social History:  reports that he has been smoking cigarettes. He has a 12.00 pack-year smoking history. He has never used smokeless tobacco. He reports current alcohol use. He reports that he does not use drugs. ? ? ?Physical Exam: ?There were no vitals taken for this visit.  ?Constitutional:  Alert and oriented, No acute distress. ?HEENT: [place] AT, moist mucus membranes.  Trachea midline, no masses. ?Cardiovascular: No clubbing, cyanosis, or edema. ?Respiratory: Normal respiratory effort, no increased work of breathing. ?Skin: No rashes, bruises or suspicious lesions. ?Neurologic: Grossly intact, no focal deficits, moving all 4 extremities. ?Psychiatric: Normal mood and affect. ? ?Laboratory Data: ? ?Lab Results  ?Component Value Date  ? CREATININE 1.24 05/10/2021  ? ?Lab Results  ?Component Value Date  ? HGBA1C 7.8 (H) 10/30/2019  ? ? ?Urinalysis ? ? ?Pertinent Imaging: ? ? ? ?Assessment & Plan:   ? ? ?No follow-ups on file. ? ?I,Kailey Littlejohn,acting as a scribe for Hollice Espy, MD.,have documented all relevant documentation on the behalf of Hollice Espy, MD,as directed by  Hollice Espy, MD while in the presence of Hollice Espy, MD. ? ? ?[facility] ?[address] ?[place], [place] [address] ?[phone] ? ?

## 2022-01-13 ENCOUNTER — Other Ambulatory Visit: Payer: Self-pay

## 2022-01-13 ENCOUNTER — Encounter: Payer: Self-pay | Admitting: Urology

## 2022-01-13 ENCOUNTER — Ambulatory Visit: Payer: Self-pay | Admitting: Urology

## 2022-01-13 DIAGNOSIS — C61 Malignant neoplasm of prostate: Secondary | ICD-10-CM

## 2022-01-22 ENCOUNTER — Telehealth: Payer: Self-pay | Admitting: Acute Care

## 2022-01-22 NOTE — Telephone Encounter (Signed)
Attempted to reach-continuous ringing, never going to voicemail.  Unable to leave VMM  ?

## 2022-01-23 ENCOUNTER — Other Ambulatory Visit: Payer: Medicare Other

## 2022-01-23 DIAGNOSIS — C61 Malignant neoplasm of prostate: Secondary | ICD-10-CM

## 2022-01-24 LAB — PSA: Prostate Specific Ag, Serum: <0.1 ng/mL (ref 0.0–4.0)

## 2022-01-27 ENCOUNTER — Telehealth: Payer: Self-pay | Admitting: *Deleted

## 2022-01-27 DIAGNOSIS — C61 Malignant neoplasm of prostate: Secondary | ICD-10-CM

## 2022-01-27 NOTE — Telephone Encounter (Addendum)
Patient informed, scheduled lap follow up voiced understanding ? ?----- Message from Hollice Espy, MD sent at 01/27/2022  7:50 AM EDT ----- ?Please thank Mr. Allen Massey for getting his PSA.  He needs to have it checked at least every 6 months. ? ?Hollice Espy, MD ? ?

## 2022-07-26 ENCOUNTER — Emergency Department: Payer: Medicare Other

## 2022-07-26 ENCOUNTER — Emergency Department
Admission: EM | Admit: 2022-07-26 | Discharge: 2022-07-26 | Disposition: A | Discharge status: Home / Self Care | Payer: Medicare Other | Attending: Emergency Medicine | Admitting: Emergency Medicine

## 2022-07-26 ENCOUNTER — Other Ambulatory Visit: Payer: Self-pay

## 2022-07-26 DIAGNOSIS — I10 Essential (primary) hypertension: Secondary | ICD-10-CM | POA: Insufficient documentation

## 2022-07-26 DIAGNOSIS — Z8546 Personal history of malignant neoplasm of prostate: Secondary | ICD-10-CM | POA: Diagnosis not present

## 2022-07-26 DIAGNOSIS — M549 Dorsalgia, unspecified: Secondary | ICD-10-CM | POA: Diagnosis present

## 2022-07-26 DIAGNOSIS — J479 Bronchiectasis, uncomplicated: Secondary | ICD-10-CM | POA: Insufficient documentation

## 2022-07-26 DIAGNOSIS — E119 Type 2 diabetes mellitus without complications: Secondary | ICD-10-CM | POA: Insufficient documentation

## 2022-07-26 DIAGNOSIS — Z8673 Personal history of transient ischemic attack (TIA), and cerebral infarction without residual deficits: Secondary | ICD-10-CM | POA: Insufficient documentation

## 2022-07-26 DIAGNOSIS — R4182 Altered mental status, unspecified: Secondary | ICD-10-CM | POA: Insufficient documentation

## 2022-07-26 DIAGNOSIS — N200 Calculus of kidney: Secondary | ICD-10-CM

## 2022-07-26 LAB — CBC
HCT: 43.4 % (ref 39.0–52.0)
Hemoglobin: 14.1 g/dL (ref 13.0–17.0)
MCH: 30.3 pg (ref 26.0–34.0)
MCHC: 32.5 g/dL (ref 30.0–36.0)
MCV: 93.3 fL (ref 80.0–100.0)
Platelets: 230 10*3/uL (ref 150–400)
RBC: 4.65 MIL/uL (ref 4.22–5.81)
RDW: 12.9 % (ref 11.5–15.5)
WBC: 8.9 10*3/uL (ref 4.0–10.5)
nRBC: 0 % (ref 0.0–0.2)

## 2022-07-26 LAB — URINALYSIS, ROUTINE W REFLEX MICROSCOPIC
Bacteria, UA: NONE SEEN
Bilirubin Urine: NEGATIVE
Glucose, UA: NEGATIVE mg/dL
Ketones, ur: NEGATIVE mg/dL
Nitrite: NEGATIVE
Protein, ur: >=300 mg/dL — AB
RBC / HPF: >50 RBC/hpf — ABNORMAL HIGH (ref 0–5)
Specific Gravity, Urine: 1.018 (ref 1.005–1.030)
pH: 5 (ref 5.0–8.0)

## 2022-07-26 LAB — BASIC METABOLIC PANEL
Anion gap: 11 (ref 5–15)
BUN: 43 mg/dL — ABNORMAL HIGH (ref 8–23)
CO2: 23 mmol/L (ref 22–32)
Calcium: 8.9 mg/dL (ref 8.9–10.3)
Chloride: 106 mmol/L (ref 98–111)
Creatinine, Ser: 2.11 mg/dL — ABNORMAL HIGH (ref 0.61–1.24)
GFR, Estimated: 33 mL/min — ABNORMAL LOW (ref >60)
Glucose, Bld: 141 mg/dL — ABNORMAL HIGH (ref 70–99)
Potassium: 4.4 mmol/L (ref 3.5–5.1)
Sodium: 140 mmol/L (ref 135–145)

## 2022-07-26 MED ORDER — OXYCODONE HCL 5 MG PO TABS: 5.0000 mg | ORAL_TABLET | Freq: Three times a day (TID) | ORAL | 0 refills | Status: AC | PRN

## 2022-07-26 MED ORDER — LACTATED RINGERS IV BOLUS
1000.0000 mL | Freq: Once | INTRAVENOUS | Status: AC
  Administered 2022-07-26: 1000 mL via INTRAVENOUS

## 2022-07-26 MED ORDER — CEFDINIR 300 MG PO CAPS
300.0000 mg | ORAL_CAPSULE | Freq: Two times a day (BID) | ORAL | Status: DC
  Administered 2022-07-26: 300 mg via ORAL
  Filled 2022-07-26: qty 1

## 2022-07-26 MED ORDER — OXYCODONE-ACETAMINOPHEN 5-325 MG PO TABS
1.0000 | ORAL_TABLET | Freq: Once | ORAL | Status: AC
  Administered 2022-07-26: 1 via ORAL
  Filled 2022-07-26: qty 1

## 2022-07-26 MED ORDER — CEFDINIR 300 MG PO CAPS: 300.0000 mg | ORAL_CAPSULE | Freq: Two times a day (BID) | ORAL | 0 refills | Status: AC
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
## 2022-07-26 NOTE — Discharge Instructions (Addendum)
You have a left-sided kidney stone which is the cause of your flank pain.  Your urine has some white blood cells in it so we are giving you an antibiotic which she should take twice a day for the next 7 days to prevent infection.  In addition to Tylenol you can take oxycodone as needed for breakthrough pain.  Please follow-up with the urologist.  Return to the emergency department if your pain is poorly controlled or you develop fevers.

## 2022-07-26 NOTE — ED Notes (Addendum)
Pt vital signs stable. Pt pain reassessed. Pt resting in bed comfortably.
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
## 2022-07-28 ENCOUNTER — Other Ambulatory Visit: Payer: Medicare Other

## 2022-07-28 DIAGNOSIS — C61 Malignant neoplasm of prostate: Secondary | ICD-10-CM

## 2022-07-28 LAB — URINE CULTURE

## 2022-07-29 ENCOUNTER — Telehealth: Payer: Self-pay

## 2022-07-29 DIAGNOSIS — C61 Malignant neoplasm of prostate: Secondary | ICD-10-CM

## 2022-07-29 LAB — PSA: Prostate Specific Ag, Serum: <0.1 ng/mL (ref 0.0–4.0)

## 2022-07-29 NOTE — Telephone Encounter (Signed)
6 months with PSA

## 2022-07-29 NOTE — Telephone Encounter (Signed)
Follow up scheduled

## 2022-07-29 NOTE — Telephone Encounter (Signed)
Informed patient of results.  When does he need to follow up?

## 2022-07-29 NOTE — Telephone Encounter (Signed)
-----   Message from Hollice Espy, MD sent at 07/29/2022  9:31 AM EDT ----- PSA remains undetectable great news  Hollice Espy, MD

## 2022-08-03 ENCOUNTER — Encounter (INDEPENDENT_AMBULATORY_CARE_PROVIDER_SITE_OTHER): Payer: Self-pay

## 2022-08-18 ENCOUNTER — Ambulatory Visit: Payer: Medicare Other | Admitting: Urology

## 2022-09-18 ENCOUNTER — Telehealth: Payer: Self-pay

## 2022-09-18 NOTE — Telephone Encounter (Signed)
Received fax from [employer] for RX order for Adult medium pull ups. Spoke with patient and he did confirm that he is using this and gets this shipped to him. Will discuss with Dr Erlene Quan to review

## 2022-09-21 NOTE — Telephone Encounter (Signed)
Form placed on Dr Cherrie Gauze desk to review.

## 2022-09-24 NOTE — Telephone Encounter (Signed)
Form has been faxed and sent for scanning

## 2023-01-26 ENCOUNTER — Encounter: Payer: Self-pay | Admitting: Urology

## 2023-01-26 ENCOUNTER — Other Ambulatory Visit: Payer: Medicare Other

## 2023-01-27 ENCOUNTER — Ambulatory Visit (INDEPENDENT_AMBULATORY_CARE_PROVIDER_SITE_OTHER): Payer: Medicare Other | Admitting: Urology

## 2023-01-27 DIAGNOSIS — Z8546 Personal history of malignant neoplasm of prostate: Secondary | ICD-10-CM

## 2023-01-27 DIAGNOSIS — N529 Male erectile dysfunction, unspecified: Secondary | ICD-10-CM

## 2023-01-27 DIAGNOSIS — C61 Malignant neoplasm of prostate: Secondary | ICD-10-CM

## 2023-01-27 MED ORDER — SILDENAFIL CITRATE 20 MG PO TABS: 20.0000 mg | ORAL_TABLET | ORAL | 11 refills | Status: AC | PRN

## 2023-01-27 NOTE — Progress Notes (Unsigned)
Allen Massey presents for an office visit. BP today is ___185/83________. He is complaint with BP medication but did not take medication this morning yet. Greater than 140/90. Provider  notified. Pt advised to take his medication when he gets home and f/u with PCP.Pt voiced understanding.

## 2023-01-27 NOTE — Progress Notes (Signed)
Marcelle Overlie Plume,acting as a scribe for Vanna Scotland, MD.,have documented all relevant documentation on the behalf of Vanna Scotland, MD,as directed by  Vanna Scotland, MD while in the presence of Vanna Scotland, MD.  01/27/2023 11:42 AM   Anna Genre 73-Nov-1952 [id number]  Referring provider: [facility], [facility] [address] [place],  [place] [address]  Chief Complaint  Patient presents with   Prostate Cancer    Follow up    HPI: 72 year-old male who presents today for follow up. He was diagnosed with high risk prostate cancer in 2021. He underwent radical prostatectomy, which was subtotal. Some tissue was left on the posterior plane. He did have a positive margin. His PSA persisted and then subsequently rose. He had 6 months of ADT with IMRT.   His most recent PSA on 07/28/2022 was undetectable.  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   Today, he reports occasional urinary incontinence but denies it being bothersome. No current issues with epididymitis or balanoposthitis. He would like to discuss increasing the dosage of sildenafil as the current dose is ineffective.    PMH: Past Medical History:  Diagnosis Date   Cancer (H[facility])    PROSTATE   Diabetes mellitus without complication ([facility])    Hyperlipidemia    Hypertension    Stroke [facility]) 1990'S   MINI STROKE    Surgical History: Past Surgical History:  Procedure Laterality Date   Colon cancer removal  10/30/2019   NO PAST SURGERIES     PELVIC LYMPH NODE DISSECTION Bilateral 10/30/2019   Procedure: PELVIC LYMPH NODE DISSECTION;  Surgeon: Vanna Scotland, MD;  Location: [facility] ORS;  Service: Urology;  Laterality: Bilateral;   ROBOT ASSISTED LAPAROSCOPIC RADICAL PROSTATECTOMY N/A 10/30/2019   Procedure: XI ROBOTIC ASSISTED LAPAROSCOPIC RADICAL PROSTATECTOMY;  Surgeon: Vanna Scotland, MD;  Location: [facility] ORS;  Service: Urology;  Laterality: N/A;    UMBILICAL HERNIA REPAIR N/A 10/30/2019   Procedure: HERNIA REPAIR UMBILICAL ADULT;  Surgeon: Vanna Scotland, MD;  Location: [facility] ORS;  Service: Urology;  Laterality: N/A;    Home Medications:  Allergies as of 01/27/2023   No Known Allergies      Medication List        Accurate as of January 27, 2023 11:42 AM. If you have any questions, ask your nurse or doctor.          STOP taking these medications    nystatin-triamcinolone ointment Commonly known as: MYCOLOG   phenazopyridine 95 MG tablet Commonly known as: PYRIDIUM       TAKE these medications    aspirin EC 81 MG tablet Take 1 tablet (81 mg total) by mouth daily.   atorvastatin 80 MG tablet Commonly known as: LIPITOR Take 1 tablet (80 mg total) by mouth daily.   brimonidine 0.2 % ophthalmic solution Commonly known as: ALPHAGAN Place 1 drop into both eyes 3 (three) times daily.   carvedilol 6.25 MG tablet Commonly known as: COREG Take 1 tablet (6.25 mg total) by mouth 2 (two) times daily.   cilostazol 100 MG tablet Commonly known as: PLETAL Take 1 tablet (100 mg total) by mouth 2 (two) times daily.   dorzolamide-timolol 2-0.5 % ophthalmic solution Commonly known as: COSOPT Place 1 drop into both eyes 2 (two) times daily.   latanoprost 0.005 % ophthalmic solution Commonly known as: XALATAN Place 1 drop into both eyes at bedtime.   losartan 25 MG tablet Commonly known as: COZAAR Take 25  mg by mouth every morning.   metFORMIN 1000 MG tablet Commonly known as: GLUCOPHAGE Take 1,000 mg by mouth 2 (two) times daily.   nitroGLYCERIN 0.4 MG SL tablet Commonly known as: NITROSTAT Place 1 tablet (0.4 mg total) under the tongue every 5 (five) minutes as needed for chest pain.   sildenafil 20 MG tablet Commonly known as: Revatio Take 1 tablet (20 mg total) by mouth as needed. Take 1-5 tabs as needed prior to intercourse        Family History: Family History  Problem Relation Age of Onset    Diabetes Brother    Kidney disease Brother     Social History:  reports that he has been smoking cigarettes. He has a 12.00 pack-year smoking history. He has never used smokeless tobacco. He reports current alcohol use. He reports that he does not use drugs.   Physical Exam: BP (!) 179/90   Pulse 60   Ht  (1.6 m)   Wt 152 lb (68.9 kg)   BMI 26.93 kg/m   Constitutional:  Alert and oriented, No acute distress. HEENT: [place] AT, moist mucus membranes.  Trachea midline, no masses. Neurologic: Grossly intact, no focal deficits, moving all 4 extremities. Psychiatric: Normal mood and affect.  Assessment & Plan:    1. History of prostate cancer - PSA is pending today - No evidence of disease  2. Erectile dysfunction - Sildenafil 100 mg  - We can discuss injections if that is not satisfactory down the road   Return in about 6 months (around 07/29/2023) for repeat PSA.  [facility] [address] [place], [place] [address] [phone]

## 2023-01-28 LAB — PSA: Prostate Specific Ag, Serum: <0.1 ng/mL (ref 0.0–4.0)

## 2023-02-12 ENCOUNTER — Other Ambulatory Visit: Payer: Self-pay

## 2023-02-12 ENCOUNTER — Emergency Department
Admission: EM | Admit: 2023-02-12 | Discharge: 2023-02-12 | Disposition: A | Discharge status: Home / Self Care | Payer: Medicare Other | Attending: Emergency Medicine | Admitting: Emergency Medicine

## 2023-02-12 ENCOUNTER — Emergency Department: Payer: Medicare Other

## 2023-02-12 ENCOUNTER — Encounter: Payer: Self-pay | Admitting: Intensive Care

## 2023-02-12 DIAGNOSIS — N132 Hydronephrosis with renal and ureteral calculous obstruction: Secondary | ICD-10-CM | POA: Insufficient documentation

## 2023-02-12 DIAGNOSIS — R109 Unspecified abdominal pain: Secondary | ICD-10-CM | POA: Diagnosis present

## 2023-02-12 DIAGNOSIS — N3000 Acute cystitis without hematuria: Secondary | ICD-10-CM | POA: Diagnosis not present

## 2023-02-12 DIAGNOSIS — Z8546 Personal history of malignant neoplasm of prostate: Secondary | ICD-10-CM | POA: Insufficient documentation

## 2023-02-12 DIAGNOSIS — N200 Calculus of kidney: Secondary | ICD-10-CM

## 2023-02-12 LAB — CBC
HCT: 41.2 % (ref 39.0–52.0)
Hemoglobin: 13.5 g/dL (ref 13.0–17.0)
MCH: 30.6 pg (ref 26.0–34.0)
MCHC: 32.8 g/dL (ref 30.0–36.0)
MCV: 93.4 fL (ref 80.0–100.0)
Platelets: 210 10*3/uL (ref 150–400)
RBC: 4.41 MIL/uL (ref 4.22–5.81)
RDW: 12.9 % (ref 11.5–15.5)
WBC: 10.5 10*3/uL (ref 4.0–10.5)
nRBC: 0 % (ref 0.0–0.2)

## 2023-02-12 LAB — COMPREHENSIVE METABOLIC PANEL
ALT: 12 U/L (ref 0–44)
AST: 19 U/L (ref 15–41)
Albumin: 3.7 g/dL (ref 3.5–5.0)
Alkaline Phosphatase: 57 U/L (ref 38–126)
Anion gap: 8 (ref 5–15)
BUN: 17 mg/dL (ref 8–23)
CO2: 28 mmol/L (ref 22–32)
Calcium: 8.9 mg/dL (ref 8.9–10.3)
Chloride: 105 mmol/L (ref 98–111)
Creatinine, Ser: 1.47 mg/dL — ABNORMAL HIGH (ref 0.61–1.24)
GFR, Estimated: 50 mL/min — ABNORMAL LOW (ref >60)
Glucose, Bld: 133 mg/dL — ABNORMAL HIGH (ref 70–99)
Potassium: 3.8 mmol/L (ref 3.5–5.1)
Sodium: 141 mmol/L (ref 135–145)
Total Bilirubin: 1 mg/dL (ref 0.3–1.2)
Total Protein: 7.3 g/dL (ref 6.5–8.1)

## 2023-02-12 LAB — URINALYSIS, ROUTINE W REFLEX MICROSCOPIC
Bilirubin Urine: NEGATIVE
Glucose, UA: NEGATIVE mg/dL
Ketones, ur: NEGATIVE mg/dL
Nitrite: NEGATIVE
Protein, ur: >=300 mg/dL — AB
Specific Gravity, Urine: 1.013 (ref 1.005–1.030)
WBC, UA: >50 WBC/hpf (ref 0–5)
pH: 6 (ref 5.0–8.0)

## 2023-02-12 LAB — LIPASE, BLOOD: Lipase: 31 U/L (ref 11–51)

## 2023-02-12 MED ORDER — HYDROCODONE-ACETAMINOPHEN 5-325 MG PO TABS
1.0000 | ORAL_TABLET | Freq: Once | ORAL | Status: AC
  Administered 2023-02-12: 1 via ORAL
  Filled 2023-02-12: qty 1

## 2023-02-12 MED ORDER — CEPHALEXIN 500 MG PO CAPS: 500.0000 mg | ORAL_CAPSULE | Freq: Four times a day (QID) | ORAL | 0 refills | Status: AC

## 2023-02-12 MED ORDER — TAMSULOSIN HCL 0.4 MG PO CAPS: 0.4000 mg | ORAL_CAPSULE | Freq: Every day | ORAL | 0 refills | Status: AC

## 2023-02-12 MED ORDER — HYDROCODONE-ACETAMINOPHEN 5-325 MG PO TABS: 1.0000 | ORAL_TABLET | Freq: Four times a day (QID) | ORAL | 0 refills | Status: AC | PRN

## 2023-02-12 NOTE — ED Notes (Signed)
US at bedside

## 2023-02-12 NOTE — ED Notes (Addendum)
This note made in error.  

## 2023-02-12 NOTE — ED Notes (Signed)
Pt reports taking longer to urinate than usual; reports L-sided testicular and back pain; reports R hand pain; R wrist pulse 1+; R hand warm, appropriate in color, cap refill less than 3 seconds; pt reports numbness at times in hands; reports stroke hx. Pt resting calmly on stretcher, skin dry and resp reg/unlabored; pt taking in full sentences to this RN and family on personal phone. Pt requesting something for pain. EDP Ray N. notified via secure chat.

## 2023-02-12 NOTE — ED Triage Notes (Signed)
Patient c/o left testicular pain and lower back pain. Reports having to strain to urinate. Also c/o right hand pain. Denies injury

## 2023-02-12 NOTE — ED Provider Notes (Signed)
[facility] Provider Note    Event Date/Time   First MD Initiated Contact with Patient 02/12/23 1235     (approximate)   History   Testicle Pain and Back Pain   HPI  Allen Massey is a 72 y.o. male with history of prostate cancer s/p radical prostatectomy, recurrent epididymitis/orchitis and balanoposthitis, prior renal stones presenting to the emergency department for evaluation of left testicular and back pain.  He reports that a few days ago he had onset of pain primarily in his left testicle but extending into his left flank as well.  Says this does not feel very similar to when he has had kidney stones in the past.  Does think he has had pain like this previously.  Does report burning when he pees and difficulty with stream.  No fevers.      Physical Exam   Triage Vital Signs: ED Triage Vitals  Enc Vitals Group     BP 02/12/23 0956 (!) 186/118     Pulse Rate 02/12/23 0955 72     Resp 02/12/23 0955 16     Temp 02/12/23 0955 97.8 F (36.6 C)     Temp Source 02/12/23 0955 Oral     SpO2 02/12/23 0955 99 %     Weight 02/12/23 0956 140 lb (63.5 kg)     Height 02/12/23 0956 5\' 4"  (1.626 m)     Head Circumference --      Peak Flow --      Pain Score 02/12/23 0955 5     Pain Loc --      Pain Edu? --      Excl. in [place]? --     Most recent vital signs: Vitals:   02/12/23 1330 02/12/23 1738  BP: (!) 174/83 (!) 183/88  Pulse: 70 76  Resp:  16  Temp:    SpO2: 97% 100%     General: Awake, interactive  CV:  Regular rate, good peripheral perfusion.  Resp:  Lungs clear, unlabored respirations.  Abd:  Soft, no significant tenderness over the abdomen, there is tenderness to palpation over the left flank without clear CVA tenderness GU:  Normal external genitalia without masses or lesions. No TTP of penis. Tenderness along the left proximal scrotum and groin.  Normal cremasteric reflex.  No visible or palpable hernia. No urethral discharge. Testicles  are in anatomical lie with tenderness along the left testicle. Neuro:  Symmetric facial movement, fluid speech   ED Results / Procedures / Treatments   Labs (all labs ordered are listed, but only abnormal results are displayed) Labs Reviewed  COMPREHENSIVE METABOLIC PANEL - Abnormal; Notable for the following components:      Result Value   Glucose, Bld 133 (*)    Creatinine, Ser 1.47 (*)    GFR, Estimated 50 (*)    All other components within normal limits  URINALYSIS, ROUTINE W REFLEX MICROSCOPIC - Abnormal; Notable for the following components:   Color, Urine YELLOW (*)    APPearance HAZY (*)    Hgb urine dipstick MODERATE (*)    Protein, ur >=300 (*)    Leukocytes,Ua SMALL (*)    Bacteria, UA RARE (*)    All other components within normal limits  URINE CULTURE  LIPASE, BLOOD  CBC     EKG EKG independently reviewed interpreted by myself (ER attending) demonstrates:    RADIOLOGY Imaging independently reviewed and interpreted by myself demonstrates:  Scrotal ultrasound without evidence of torsion, radiologist interprets  as 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  Differential diagnosis includes, but is not limited to, recurrent epididymitis, orchitis, renal stone, lower suspicion torsion  Patient's presentation is most consistent with acute presentation with potential threat to life or bodily function.  72 year old male presenting with left testicular and flank pain. Labs from triage overall reassuring.  Urine with some hematuria, somewhat concerning  for infection.  Will send urine culture.  Will also obtain scrotal ultrasound and CT renal stone to further evaluate.  Scrotal ultrasound with hypervascularity, possibly reflective of chronic epididymitis.  CT scan demonstrating left-sided obstructing stone.  Suspect that obstructing stone is likely cause of patient's pain.  He does have a thick bladder with urinalysis that is concerning for infection.  With this, I did review the case with Dr. Mena Goes with urology.  Fortunately patient is clinically well-appearing with normal white blood cell count and no systemic symptoms.  With this, he does feel that patient can be safely discharged home for close outpatient follow-up.  He did recommend discharge with Flomax and Keflex which were both prescribed.  Patient was also prescribed Norco.  Patient was reevaluated following completion of his workup.  He reports that his pain is since improved.  He do think his symptoms are manageable enough that he can be safely discharged home.  Did discuss very strict return precautions including for signs suggestive of developing sepsis.  Strict return precautions were provided.  Patient was discharged in stable condition.       FINAL CLINICAL IMPRESSION(S) / ED DIAGNOSES   Final diagnoses:  Kidney stone  Acute cystitis without hematuria     Rx / DC Orders   ED Discharge Orders          Ordered    HYDROcodone-acetaminophen (NORCO) 5-325 MG tablet  Every 6 hours PRN        02/12/23 1721    tamsulosin (FLOMAX) 0.4 MG CAPS capsule  Daily        02/12/23 1721    cephALEXin (KEFLEX) 500 MG capsule  4 times daily        02/12/23 1721             Note:  This document was prepared using Dragon voice recognition software and may include unintentional dictation errors.   Trinna Post, MD 02/12/23 [phone]

## 2023-02-12 NOTE — Discharge Instructions (Signed)
You were seen in the emergency department today for evaluation of your back and testicular pain.  Your CT scan did show that you have a kidney stone that I suspect is the cause of your pain.  Your urine is also concerning for an infection.  I spoke with your urology group.  You can take Tylenol ibuprofen as needed to help with your pain.  If you have breakthrough pain, I have sent a short course of narcotic pain medicine that you can take as needed.  Do not drive or operate machinery when taking this.  In addition, I have sent a medication called Flomax that you should take until you are able to pass your stone.  Finally, I have sent an antibiotic to treat your urine infection.  I have included information to contact urology above.  Please call them Monday morning to arrange follow-up for further evaluation.  Return to the ER for new or worsening symptoms including fevers, worsening back pain, uncontrolled pain despite the medications you have, inability to tolerate food or liquids, or any other new or concerning symptoms.

## 2023-02-14 LAB — URINE CULTURE: Culture: >=100000 — AB

## 2023-02-15 NOTE — Progress Notes (Signed)
ED Antimicrobial Stewardship Positive Culture Follow Up   Allen Massey is an 72 y.o. male who presented to [facility] on 02/12/2023 with a chief complaint of pain of left testicle as well as left flank pain. PMH recurrent epidydmitis. Urine culture collected growing Serratia marcescens.  Chief Complaint  Patient presents with   Testicle Pain   Back Pain    Recent Results (from the past 720 hour(s))  Urine Culture     Status: Abnormal   Collection Time: 02/12/23 10:00 AM   Specimen: Urine, Clean Catch  Result Value Ref Range Status   Specimen Description   Final    URINE, CLEAN CATCH Performed at [facility], [address], [place], [place] [address]    Special Requests   Final    NONE Performed at [facility], [address] [place], [place] [address]    Culture >=100,000 COLONIES/mL SERRATIA MARCESCENS (A)  Final   Report Status 02/14/2023 FINAL  Final   Organism ID, Bacteria SERRATIA MARCESCENS (A)  Final      Susceptibility   Serratia marcescens - MIC*    CEFEPIME <=0.12 SENSITIVE Sensitive     CEFTRIAXONE <=0.25 SENSITIVE Sensitive     CIPROFLOXACIN <=0.25 SENSITIVE Sensitive     GENTAMICIN <=1 SENSITIVE Sensitive     NITROFURANTOIN 256 RESISTANT Resistant     TRIMETH/SULFA <=20 SENSITIVE Sensitive     * >=100,000 COLONIES/mL SERRATIA MARCESCENS    [x]  Treated with cephalexin, organism resistant to prescribed antimicrobial []  Patient discharged originally without antimicrobial agent and treatment is now indicated  New antibiotic prescription: ciprofloxacin 500 mg every 12 hours x 7 days  ED Provider: Dr. Nolon Rod, PharmD, BCPS Clinical Pharmacist  02/15/2023 3:56 PM

## 2023-02-24 ENCOUNTER — Emergency Department: Payer: Medicare Other

## 2023-02-24 ENCOUNTER — Emergency Department
Admission: EM | Admit: 2023-02-24 | Discharge: 2023-02-24 | Disposition: A | Discharge status: Home / Self Care | Payer: Medicare Other | Attending: Emergency Medicine | Admitting: Emergency Medicine

## 2023-02-24 ENCOUNTER — Other Ambulatory Visit: Payer: Self-pay

## 2023-02-24 DIAGNOSIS — N50812 Left testicular pain: Secondary | ICD-10-CM | POA: Diagnosis present

## 2023-02-24 DIAGNOSIS — N451 Epididymitis: Secondary | ICD-10-CM | POA: Diagnosis not present

## 2023-02-24 LAB — URINALYSIS, ROUTINE W REFLEX MICROSCOPIC
Bacteria, UA: NONE SEEN
Bilirubin Urine: NEGATIVE
Glucose, UA: NEGATIVE mg/dL
Hgb urine dipstick: NEGATIVE
Ketones, ur: NEGATIVE mg/dL
Leukocytes,Ua: NEGATIVE
Nitrite: NEGATIVE
Protein, ur: >=300 mg/dL — AB
Specific Gravity, Urine: 1.018 (ref 1.005–1.030)
pH: 5 (ref 5.0–8.0)

## 2023-02-24 MED ORDER — CIPROFLOXACIN HCL 500 MG PO TABS: 500.0000 mg | ORAL_TABLET | Freq: Two times a day (BID) | ORAL | 0 refills | Status: AC

## 2023-02-24 NOTE — ED Triage Notes (Signed)
Pt to ED for lower abd pain radiating to left testicle for 2 weeks. Denies swelling.   Pt also states lives alone, has nobody to help him and needs help getting into nursing facility.

## 2023-02-24 NOTE — ED Notes (Signed)
See triage note. Pt confirms has been able to urinate well. Pt in NAD; resp reg/unlabored, skin dry and calmly laying on stretcher.

## 2023-02-24 NOTE — Discharge Instructions (Addendum)
Please use ibuprofen (Motrin) up to 800 mg every 8 hours OR naproxen (Naprosyn) up to 500 mg every 12 hours for any continued pain.  Please do not use this medication regimen for longer than 7 days 

## 2023-02-24 NOTE — ED Triage Notes (Signed)
First Nurse Note Pt via EMS from home. Pt was seen last week for gall stones, pt c/o RUQ pain that radiates down to L testicle pain. Pt is A&Ox4 and NAD 171/93 BP  86 HR  98% on RA

## 2023-02-24 NOTE — ED Notes (Signed)
Pt states is going to take a taxi back home.

## 2023-02-24 NOTE — ED Provider Notes (Signed)
[facility] Provider Note   Event Date/Time   First MD Initiated Contact with Patient 02/24/23 1411     (approximate) History  Testicle Pain  HPI Allen Massey is a 72 y.o. male with the elsewhere documented past medical history who presents complaining of testicular pain that he describes as 8/10, throbbing, and at the top of his scrotum.  Patient states that walking and any movement worsens his pain and he denies any relieving factors.  Patient states that he was recently treated for a kidney stone and that this pain is similar but has not gone away. ROS: Patient currently denies any vision changes, tinnitus, difficulty speaking, facial droop, sore throat, chest pain, shortness of breath, abdominal pain, nausea/vomiting/diarrhea, dysuria, or weakness/numbness/paresthesias in any extremity   Physical Exam  Triage Vital Signs: ED Triage Vitals  Enc Vitals Group     BP 02/24/23 1307 (!) 160/101     Pulse Rate 02/24/23 1307 84     Resp 02/24/23 1307 18     Temp 02/24/23 1305 98.5 F (36.9 C)     Temp src --      SpO2 02/24/23 1307 96 %     Weight 02/24/23 1307 138 lb 14.2 oz (63 kg)     Height 02/24/23 1307 5\' 4"  (1.626 m)     Head Circumference --      Peak Flow --      Pain Score 02/24/23 1306 8     Pain Loc --      Pain Edu? --      Excl. in GC? --    Most recent vital signs: Vitals:   02/24/23 1534 02/24/23 1535  BP:    Pulse: 65 75  Resp: 17   Temp:    SpO2:  98%   General: Awake, oriented x4. CV:  Good peripheral perfusion.  Resp:  Normal effort.  Abd:  No distention.  Other:  Well-developed, well-nourished elderly African-American male laying in bed in no acute distress.  Normal external male genitalia with testicles in normal lie and with mild tenderness to palpation along the epididymis bilaterally ED Results / Procedures / Treatments  Labs (all labs ordered are listed, but only abnormal results are displayed) Labs Reviewed   URINALYSIS, ROUTINE W REFLEX MICROSCOPIC - Abnormal; Notable for the following components:      Result Value   Color, Urine YELLOW (*)    APPearance CLEAR (*)    Protein, ur >=300 (*)    All other components within normal limits   RADIOLOGY ED MD interpretation: Doppler ultrasound of the scrotum shows epididymal inflammation and thickening bilaterally concerning for possible infection -Agree with radiology assessment Official radiology report(s): US SCROTUM W/DOPPLER  Result Date: 02/24/2023 CLINICAL DATA:  Testicular pain. EXAM: SCROTAL ULTRASOUND DOPPLER ULTRASOUND OF THE TESTICLES TECHNIQUE: Complete ultrasound examination of the testicles, epididymis, and other scrotal structures was performed. Color and spectral Doppler ultrasound were also utilized to evaluate blood flow to the testicles. COMPARISON:  02/12/2023. FINDINGS: Right testicle Measurements: 3.6 x 2.1 x 3.2 cm. No mass or microlithiasis visualized. Left testicle Measurements: 3.4 x 2.7 x 2.8 cm. No mass or microlithiasis visualized. Right epididymis: Tail appears slightly thickened but without increased vascularity, nonspecific. Left epididymis: Tail appears somewhat thickened but without increased vascularity, nonspecific. Hydrocele:  None visualized. Varicocele:  Bilateral varicoceles. Pulsed Doppler interrogation of both testes demonstrates normal low resistance arterial and venous waveforms bilaterally. IMPRESSION: 1. No acute findings. 2. Persistent thickening of the epididymal  tail bilaterally, possibly related to a previous infectious/inflammatory insult. 3. Bilateral varicoceles. Electronically Signed   By: Leanna Battles M.D.   On: 02/24/2023 14:52   PROCEDURES: Critical Care performed: No Procedures MEDICATIONS ORDERED IN ED: Medications - No data to display IMPRESSION / MDM / ASSESSMENT AND PLAN / ED COURSE  I reviewed the triage vital signs and the nursing notes.                             Patient's presentation  is most consistent with acute presentation with potential threat to life or bodily function. The patient is suffering from testicular pain, but based on the history, exam, and testing, I do not suspect that the patient has testicular torsion, abscess, severe cellulitis, Fournier's gangrene, or other emergent cause.  UA unremarkable US Scrotum: Bilateral epididymitis Clindamycin Rx Disposition: Plan follow up with primary care doctor for symptom re-check and possible referral to urology. Discussed return precautions at bedside. Discharge.   FINAL CLINICAL IMPRESSION(S) / ED DIAGNOSES   Final diagnoses:  Epididymitis  Pain in both testicles   Rx / DC Orders   ED Discharge Orders          Ordered    ciprofloxacin (CIPRO) 500 MG tablet  2 times daily        02/24/23 1517           Note:  This document was prepared using Dragon voice recognition software and may include unintentional dictation errors.   Merwyn Katos, MD 02/24/23 [phone]

## 2023-03-12 ENCOUNTER — Ambulatory Visit: Payer: Medicare Other | Admitting: Physician Assistant

## 2023-05-26 ENCOUNTER — Emergency Department: Payer: Medicare Other

## 2023-05-26 ENCOUNTER — Emergency Department
Admission: EM | Admit: 2023-05-26 | Discharge: 2023-05-26 | Disposition: A | Discharge status: Home / Self Care | Payer: Medicare Other | Attending: Emergency Medicine | Admitting: Emergency Medicine

## 2023-05-26 ENCOUNTER — Other Ambulatory Visit: Payer: Self-pay

## 2023-05-26 DIAGNOSIS — N453 Epididymo-orchitis: Secondary | ICD-10-CM | POA: Diagnosis not present

## 2023-05-26 DIAGNOSIS — R93421 Abnormal radiologic findings on diagnostic imaging of right kidney: Secondary | ICD-10-CM | POA: Diagnosis not present

## 2023-05-26 DIAGNOSIS — R93422 Abnormal radiologic findings on diagnostic imaging of left kidney: Secondary | ICD-10-CM | POA: Insufficient documentation

## 2023-05-26 DIAGNOSIS — R1030 Lower abdominal pain, unspecified: Secondary | ICD-10-CM | POA: Diagnosis not present

## 2023-05-26 DIAGNOSIS — N50812 Left testicular pain: Secondary | ICD-10-CM | POA: Diagnosis present

## 2023-05-26 LAB — CBC
HCT: 41.8 % (ref 39.0–52.0)
Hemoglobin: 13.6 g/dL (ref 13.0–17.0)
MCH: 29.7 pg (ref 26.0–34.0)
MCHC: 32.5 g/dL (ref 30.0–36.0)
MCV: 91.3 fL (ref 80.0–100.0)
Platelets: 222 10*3/uL (ref 150–400)
RBC: 4.58 MIL/uL (ref 4.22–5.81)
RDW: 13.2 % (ref 11.5–15.5)
WBC: 6.4 10*3/uL (ref 4.0–10.5)
nRBC: 0 % (ref 0.0–0.2)

## 2023-05-26 LAB — COMPREHENSIVE METABOLIC PANEL
ALT: 10 U/L (ref 0–44)
AST: 18 U/L (ref 15–41)
Albumin: 3.6 g/dL (ref 3.5–5.0)
Alkaline Phosphatase: 54 U/L (ref 38–126)
Anion gap: 6 (ref 5–15)
BUN: 21 mg/dL (ref 8–23)
CO2: 26 mmol/L (ref 22–32)
Calcium: 9 mg/dL (ref 8.9–10.3)
Chloride: 108 mmol/L (ref 98–111)
Creatinine, Ser: 1.74 mg/dL — ABNORMAL HIGH (ref 0.61–1.24)
GFR, Estimated: 41 mL/min — ABNORMAL LOW (ref >60)
Glucose, Bld: 101 mg/dL — ABNORMAL HIGH (ref 70–99)
Potassium: 4.6 mmol/L (ref 3.5–5.1)
Sodium: 140 mmol/L (ref 135–145)
Total Bilirubin: 0.5 mg/dL (ref 0.3–1.2)
Total Protein: 7.5 g/dL (ref 6.5–8.1)

## 2023-05-26 LAB — URINALYSIS, ROUTINE W REFLEX MICROSCOPIC
Bacteria, UA: NONE SEEN
Bilirubin Urine: NEGATIVE
Glucose, UA: NEGATIVE mg/dL
Ketones, ur: NEGATIVE mg/dL
Leukocytes,Ua: NEGATIVE
Nitrite: NEGATIVE
Protein, ur: 100 mg/dL — AB
Specific Gravity, Urine: 1.012 (ref 1.005–1.030)
Squamous Epithelial / HPF: NONE SEEN /HPF (ref 0–5)
pH: 6 (ref 5.0–8.0)

## 2023-05-26 LAB — LIPASE, BLOOD: Lipase: 35 U/L (ref 11–51)

## 2023-05-26 MED ORDER — LEVOFLOXACIN 500 MG PO TABS
500.0000 mg | ORAL_TABLET | Freq: Once | ORAL | Status: AC
  Administered 2023-05-26: 500 mg via ORAL
  Filled 2023-05-26: qty 1

## 2023-05-26 MED ORDER — ONDANSETRON HCL 4 MG/2ML IJ SOLN
4.0000 mg | Freq: Once | INTRAMUSCULAR | Status: AC
  Administered 2023-05-26: 4 mg via INTRAVENOUS
  Filled 2023-05-26: qty 2

## 2023-05-26 MED ORDER — SODIUM CHLORIDE 0.9 % IV BOLUS
1000.0000 mL | Freq: Once | INTRAVENOUS | Status: AC
  Administered 2023-05-26: 1000 mL via INTRAVENOUS

## 2023-05-26 MED ORDER — CEFTRIAXONE SODIUM 500 MG IJ SOLR
500.0000 mg | Freq: Once | INTRAMUSCULAR | Status: AC
  Administered 2023-05-26: 500 mg via INTRAMUSCULAR
  Filled 2023-05-26: qty 500

## 2023-05-26 MED ORDER — CARVEDILOL 6.25 MG PO TABS: 6.2500 mg | ORAL_TABLET | Freq: Two times a day (BID) | ORAL | 1 refills | Status: AC

## 2023-05-26 MED ORDER — MORPHINE SULFATE (PF) 4 MG/ML IV SOLN
4.0000 mg | Freq: Once | INTRAVENOUS | Status: AC
  Administered 2023-05-26: 4 mg via INTRAVENOUS
  Filled 2023-05-26: qty 1

## 2023-05-26 MED ORDER — OXYCODONE-ACETAMINOPHEN 5-325 MG PO TABS
1.0000 | ORAL_TABLET | Freq: Once | ORAL | Status: AC
  Administered 2023-05-26: 1 via ORAL
  Filled 2023-05-26: qty 1

## 2023-05-26 MED ORDER — LEVOFLOXACIN 500 MG PO TABS: 500.0000 mg | ORAL_TABLET | Freq: Every day | ORAL | 0 refills | Status: AC

## 2023-05-26 MED ORDER — HYDRALAZINE HCL 20 MG/ML IJ SOLN
10.0000 mg | Freq: Once | INTRAMUSCULAR | Status: AC
  Administered 2023-05-26: 10 mg via INTRAVENOUS
  Filled 2023-05-26: qty 1

## 2023-05-26 MED ORDER — LOSARTAN POTASSIUM 25 MG PO TABS: 25.0000 mg | ORAL_TABLET | ORAL | 1 refills | Status: AC

## 2023-05-26 MED ORDER — OXYCODONE-ACETAMINOPHEN 5-325 MG PO TABS: 1.0000 | ORAL_TABLET | ORAL | 0 refills | Status: AC | PRN

## 2023-05-26 NOTE — ED Triage Notes (Signed)
Pt to ED via POV from home. Pt reports LLQ/groin pain for the past week. Pt on BP medication out of BP medication. Pt denies HA, dizziness or CP. Pt with hx of kidney stones.

## 2023-05-26 NOTE — Discharge Instructions (Addendum)
Follow-up with Urology in 1 week   Take the antibiotic as prescribed  Take the percocet as needed for severe pain  Do not take more than 6 tablets of percocet or more than 4000 mg total of tylenol per day

## 2023-05-26 NOTE — ED Provider Notes (Signed)
[facility] Provider Note    Event Date/Time   First MD Initiated Contact with Patient 05/26/23 1258     (approximate)   History   Groin Pain   HPI  Allen Massey is a 72 y.o. male  here with groin pain. Pt reports that over the past 2 days he has had gradual onset of progressively worsening left testicular and lower abd pain. It began more in his left groin/lower abdomen and now radiates down toward his L testicle. Reports a h/o recurrent epididymitis with similar issues. No penile discharge, no dysuria. No other complaints. No fevers, chills.       Physical Exam   Triage Vital Signs: ED Triage Vitals  Encounter Vitals Group     BP 05/26/23 1231 (!) 200/117     Systolic BP Percentile --      Diastolic BP Percentile --      Pulse Rate 05/26/23 1231 85     Resp 05/26/23 1231 18     Temp 05/26/23 1230 98.1 F (36.7 C)     Temp Source 05/26/23 1230 Oral     SpO2 05/26/23 1231 100 %     Weight --      Height --      Head Circumference --      Peak Flow --      Pain Score 05/26/23 1230 10     Pain Loc --      Pain Education --      Exclude from Growth Chart --     Most recent vital signs: Vitals:   05/26/23 1652 05/26/23 1656  BP: (!) 183/91 (!) 186/79  Pulse:    Resp:    Temp:    SpO2:       General: Awake, no distress.  CV:  Good peripheral perfusion.  Resp:  Normal work of breathing.  Abd:  No distention. Mild suprapubic TTP. No rebound or guarding. Other:  TTP over bilateral but L>R posterior testicles. Testes descended b/l with intact cremasterics.   ED Results / Procedures / Treatments   Labs (all labs ordered are listed, but only abnormal results are displayed) Labs Reviewed  COMPREHENSIVE METABOLIC PANEL - Abnormal; Notable for the following components:      Result Value   Glucose, Bld 101 (*)    Creatinine, Ser 1.74 (*)    GFR, Estimated 41 (*)    All other components within normal limits  URINALYSIS, ROUTINE W  REFLEX MICROSCOPIC - Abnormal; Notable for the following components:   Color, Urine STRAW (*)    APPearance CLEAR (*)    Hgb urine dipstick SMALL (*)    Protein, ur 100 (*)    All other components within normal limits  URINE CULTURE  LIPASE, BLOOD  CBC     EKG    RADIOLOGY US Scrotum: bilateral epididymo-orchitis US renal: Normal   I also independently reviewed and agree with radiologist interpretations.   PROCEDURES:  Critical Care performed: No   MEDICATIONS ORDERED IN ED: Medications  morphine (PF) 4 MG/ML injection 4 mg (4 mg Intravenous Given 05/26/23 1320)  ondansetron (ZOFRAN) injection 4 mg (4 mg Intravenous Given 05/26/23 1320)  sodium chloride 0.9 % bolus 1,000 mL (0 mLs Intravenous Stopped 05/26/23 1619)  oxyCODONE-acetaminophen (PERCOCET/ROXICET) 5-325 MG per tablet 1 tablet (1 tablet Oral Given 05/26/23 1531)  cefTRIAXone (ROCEPHIN) injection 500 mg (500 mg Intramuscular Given 05/26/23 1617)  levofloxacin (LEVAQUIN) tablet 500 mg (500 mg Oral Given 05/26/23 1616)  hydrALAZINE (APRESOLINE) injection 10 mg (10 mg Intravenous Given 05/26/23 1652)     IMPRESSION / MDM / ASSESSMENT AND PLAN / ED COURSE  I reviewed the triage vital signs and the nursing notes.                              Differential diagnosis includes, but is not limited to, epididymitis, orchitis, torsion, ureteral stone, bladder spasm, diverticulitis  Patient's presentation is most consistent with acute presentation with potential threat to life or bodily function.  The patient is on the cardiac monitor to evaluate for evidence of arrhythmia and/or significant heart rate changes  72 yo M with h/o recurrent epididymitis here with testicular pain, lower abd pain. UA shows no signs of UTI. US Renal without hydro or evidence to suggest obstructing stone. CMP shows baseline CKD, no acute abnormalities. CBC shows no leukocytosis or anemia.  Scrotal u/s shows recurrent b/l epididymo-orchitis.  Unfortunately, this seems to be a recurrent issue for pt., Will place him on a course of outpt levaquin and refer to Urology to discuss additional treatment options. Brief course of analgesia given. He does not appear septic and pain improved in ED.   FINAL CLINICAL IMPRESSION(S) / ED DIAGNOSES   Final diagnoses:  Epididymo-orchitis     Rx / DC Orders   ED Discharge Orders          Ordered    levofloxacin (LEVAQUIN) 500 MG tablet  Daily        05/26/23 1541    oxyCODONE-acetaminophen (PERCOCET) 5-325 MG tablet  Every 4 hours PRN        05/26/23 1541    carvedilol (COREG) 6.25 MG tablet  2 times daily        05/26/23 1701    losartan (COZAAR) 25 MG tablet  BH-each morning        05/26/23 1701             Note:  This document was prepared using Dragon voice recognition software and may include unintentional dictation errors.   Shaune Pollack, MD 05/26/23 2206

## 2023-05-26 NOTE — ED Notes (Signed)
First Nurse Note: Patient to ED via [facility] from home for LLQ abd pain that radiates into groin. Pain ongoing x2 days. Has not taken home meds today.

## 2023-05-27 LAB — URINE CULTURE: Culture: <10000 — AB

## 2023-07-23 ENCOUNTER — Ambulatory Visit: Payer: Medicare Other | Admitting: Physician Assistant

## 2023-07-23 ENCOUNTER — Encounter: Payer: Self-pay | Admitting: Physician Assistant

## 2023-07-23 VITALS — BP 187/80 | HR 78

## 2023-07-23 DIAGNOSIS — R3129 Other microscopic hematuria: Secondary | ICD-10-CM

## 2023-07-23 DIAGNOSIS — N451 Epididymitis: Secondary | ICD-10-CM | POA: Diagnosis not present

## 2023-07-23 LAB — URINALYSIS, COMPLETE
Bilirubin, UA: NEGATIVE
Glucose, UA: NEGATIVE
Ketones, UA: NEGATIVE
Leukocytes,UA: NEGATIVE
Nitrite, UA: NEGATIVE
Specific Gravity, UA: >1.03 — ABNORMAL HIGH (ref 1.005–1.030)
Urobilinogen, Ur: 0.2 mg/dL (ref 0.2–1.0)
pH, UA: 5.5 (ref 5.0–7.5)

## 2023-07-23 LAB — MICROSCOPIC EXAMINATION

## 2023-07-23 MED ORDER — LEVOFLOXACIN 500 MG PO TABS
500.0000 mg | ORAL_TABLET | Freq: Every day | ORAL | 0 refills | Status: AC
Start: 2023-07-23 — End: ?

## 2023-07-23 NOTE — Progress Notes (Unsigned)
07/23/2023 2:18 PM   Anna Genre Aug 25, 1951 [id number]  CC: Chief Complaint  Patient presents with   Follow-up   HPI: Allen Massey is a 72 y.o. male with PMH prostate cancer s/p prostatectomy, ADT, and salvage radiation; ED; recurrent epididymoorchitis; and recurrent balanoposthitis who presents today for evaluation of possible epididymitis.   Today he reports a 1 month history of aching in the left testicle and radiating up the left spermatic cord.  It has been progressively improving.  He has not taken any medication for it.  In-office UA today positive for 1+ blood and 3+ protein; urine microscopy with 3-10 RBCs/HPF.  PMH: Past Medical History:  Diagnosis Date   Cancer (HCC)    PROSTATE   Diabetes mellitus without complication (HCC)    Hyperlipidemia    Hypertension    Stroke [facility]) 1990'S   MINI STROKE    Surgical History: Past Surgical History:  Procedure Laterality Date   Colon cancer removal  10/30/2019   NO PAST SURGERIES     PELVIC LYMPH NODE DISSECTION Bilateral 10/30/2019   Procedure: PELVIC LYMPH NODE DISSECTION;  Surgeon: Vanna Scotland, MD;  Location: [facility] ORS;  Service: Urology;  Laterality: Bilateral;   ROBOT ASSISTED LAPAROSCOPIC RADICAL PROSTATECTOMY N/A 10/30/2019   Procedure: XI ROBOTIC ASSISTED LAPAROSCOPIC RADICAL PROSTATECTOMY;  Surgeon: Vanna Scotland, MD;  Location: [facility] ORS;  Service: Urology;  Laterality: N/A;   UMBILICAL HERNIA REPAIR N/A 10/30/2019   Procedure: HERNIA REPAIR UMBILICAL ADULT;  Surgeon: Vanna Scotland, MD;  Location: [facility] ORS;  Service: Urology;  Laterality: N/A;    Home Medications:  Allergies as of 07/23/2023   No Known Allergies      Medication List        Accurate as of July 23, 2023  2:18 PM. If you have any questions, ask your nurse or doctor.          aspirin EC 81 MG tablet Take 1 tablet (81 mg total) by mouth daily.   atorvastatin 80 MG tablet Commonly known as: LIPITOR Take 1 tablet (80 mg  total) by mouth daily.   brimonidine 0.2 % ophthalmic solution Commonly known as: ALPHAGAN Place 1 drop into both eyes 3 (three) times daily.   carvedilol 6.25 MG tablet Commonly known as: COREG Take 1 tablet (6.25 mg total) by mouth 2 (two) times daily.   cilostazol 100 MG tablet Commonly known as: PLETAL Take 1 tablet (100 mg total) by mouth 2 (two) times daily.   dorzolamide-timolol 2-0.5 % ophthalmic solution Commonly known as: COSOPT Place 1 drop into both eyes 2 (two) times daily.   latanoprost 0.005 % ophthalmic solution Commonly known as: XALATAN Place 1 drop into both eyes at bedtime.   losartan 25 MG tablet Commonly known as: COZAAR Take 1 tablet (25 mg total) by mouth every morning.   metFORMIN 1000 MG tablet Commonly known as: GLUCOPHAGE Take 1,000 mg by mouth 2 (two) times daily.   nitroGLYCERIN 0.4 MG SL tablet Commonly known as: NITROSTAT Place 1 tablet (0.4 mg total) under the tongue every 5 (five) minutes as needed for chest pain.   oxyCODONE-acetaminophen 5-325 MG tablet Commonly known as: Percocet Take 1 tablet by mouth every 4 (four) hours as needed for severe pain (no more than 6 tabs daily).   sildenafil 20 MG tablet Commonly known as: Revatio Take 1 tablet (20 mg total) by mouth as needed. Take 1-5 tabs as needed prior to intercourse        Allergies:  No Known  Allergies  Family History: Family History  Problem Relation Age of Onset   Diabetes Brother    Kidney disease Brother     Social History:   reports that he has been smoking cigarettes. He has a 12 pack-year smoking history. He has never used smokeless tobacco. He reports current alcohol use of about 14.0 standard drinks of alcohol per week. He reports that he does not use drugs.  Physical Exam: BP (!) 187/80   Pulse 78   Constitutional:  Alert and oriented, no acute distress, nontoxic appearing HEENT: [place], AT Cardiovascular: No clubbing, cyanosis, or edema Respiratory:  Normal respiratory effort, no increased work of breathing GU: Bilateral descended testicles.  No scrotal thickening or purulence.  No testicular tenderness or enlargement.  Enlarged and mildly tender left epididymis containing a discrete nodule consistent with epididymal cyst. Skin: No rashes, bruises or suspicious lesions Neurologic: Grossly intact, no focal deficits, moving all 4 extremities Psychiatric: Normal mood and affect  Laboratory Data: Results for orders placed or performed in visit on 07/23/23  Microscopic Examination   Urine  Result Value Ref Range   WBC, UA 0-5 0 - 5 /hpf   RBC, Urine 3-10 (A) 0 - 2 /hpf   Epithelial Cells (non renal) 0-10 0 - 10 /hpf   Bacteria, UA Few None seen/Few  Urinalysis, Complete  Result Value Ref Range   Specific Gravity, UA >1.030 (H) 1.005 - 1.030   pH, UA 5.5 5.0 - 7.5   Color, UA Yellow Yellow   Appearance Ur Clear Clear   Leukocytes,UA Negative Negative   Protein,UA 3+ (A) Negative/Trace   Glucose, UA Negative Negative   Ketones, UA Negative Negative   RBC, UA 1+ (A) Negative   Bilirubin, UA Negative Negative   Urobilinogen, Ur 0.2 0.2 - 1.0 mg/dL   Nitrite, UA Negative Negative   Microscopic Examination See below:    Assessment & Plan:   1. Left epididymitis Chronic/recurrent epididymitis.  UA not particularly impressive today.  I do think I palpate a new left epididymal cyst today that was not apparent on prior ultrasounds.  Will treat with 14 days of Levaquin due to concern for possible cyst involvement versus evolving abscess.  Will repeat scrotal ultrasound on an outpatient basis, no indication for urgent imaging given subacute presentation. - Urinalysis, Complete - CULTURE, URINE COMPREHENSIVE - levofloxacin (LEVAQUIN) 500 MG tablet; Take 1 tablet (500 mg total) by mouth daily.  Dispense: 14 tablet; Refill: 0 - US SCROTUM W/DOPPLER; Future  2. Microscopic hematuria Will repeat UA in about 2 weeks to prove resolution of  microscopic hematuria.  Return in about 2 weeks (around 08/06/2023) for Lab visit for UA, Will call with results.  Carman Ching, PA-C  [facility] [place] [address] [place], [place] [address] [phone]

## 2023-07-26 LAB — CULTURE, URINE COMPREHENSIVE

## 2023-08-05 ENCOUNTER — Other Ambulatory Visit: Payer: Self-pay

## 2023-08-05 DIAGNOSIS — R3129 Other microscopic hematuria: Secondary | ICD-10-CM

## 2023-08-06 ENCOUNTER — Other Ambulatory Visit: Payer: Medicare Other

## 2023-08-06 ENCOUNTER — Ambulatory Visit
Admission: RE | Admit: 2023-08-06 | Discharge: 2023-08-06 | Disposition: A | Discharge status: Home / Self Care | Payer: Medicare Other | Source: Ambulatory Visit | Attending: Physician Assistant | Admitting: Physician Assistant

## 2023-08-06 DIAGNOSIS — N451 Epididymitis: Secondary | ICD-10-CM | POA: Diagnosis present

## 2023-08-06 DIAGNOSIS — R3129 Other microscopic hematuria: Secondary | ICD-10-CM

## 2023-08-06 LAB — URINALYSIS, COMPLETE
Bilirubin, UA: NEGATIVE
Glucose, UA: NEGATIVE
Ketones, UA: NEGATIVE
Leukocytes,UA: NEGATIVE
Nitrite, UA: NEGATIVE
Specific Gravity, UA: 1.02 (ref 1.005–1.030)
Urobilinogen, Ur: 1 mg/dL (ref 0.2–1.0)
pH, UA: 5.5 (ref 5.0–7.5)

## 2023-08-06 LAB — MICROSCOPIC EXAMINATION: Bacteria, UA: NONE SEEN

## 2023-08-20 ENCOUNTER — Other Ambulatory Visit: Payer: Medicare Other

## 2023-08-24 ENCOUNTER — Ambulatory Visit: Payer: Medicare Other | Admitting: Urology

## 2024-01-06 ENCOUNTER — Other Ambulatory Visit: Payer: Self-pay

## 2024-01-06 DIAGNOSIS — C61 Malignant neoplasm of prostate: Secondary | ICD-10-CM

## 2024-01-17 ENCOUNTER — Other Ambulatory Visit: Payer: Self-pay

## 2024-01-18 ENCOUNTER — Other Ambulatory Visit: Payer: Medicare Other

## 2024-01-19 ENCOUNTER — Ambulatory Visit: Payer: Self-pay | Admitting: Urology

## 2024-01-20 ENCOUNTER — Ambulatory Visit: Payer: Medicare Other | Admitting: Physician Assistant

## 2024-07-24 ENCOUNTER — Other Ambulatory Visit: Payer: Self-pay

## 2024-07-24 DIAGNOSIS — C61 Malignant neoplasm of prostate: Secondary | ICD-10-CM

## 2024-07-24 DIAGNOSIS — R3129 Other microscopic hematuria: Secondary | ICD-10-CM

## 2024-07-25 ENCOUNTER — Other Ambulatory Visit

## 2024-07-25 DIAGNOSIS — C61 Malignant neoplasm of prostate: Secondary | ICD-10-CM

## 2024-07-25 DIAGNOSIS — R3129 Other microscopic hematuria: Secondary | ICD-10-CM

## 2024-08-01 ENCOUNTER — Encounter: Payer: Self-pay | Admitting: Physician Assistant

## 2024-08-02 ENCOUNTER — Other Ambulatory Visit

## 2024-08-02 DIAGNOSIS — R3129 Other microscopic hematuria: Secondary | ICD-10-CM

## 2024-08-02 DIAGNOSIS — C61 Malignant neoplasm of prostate: Secondary | ICD-10-CM

## 2024-08-02 LAB — MICROSCOPIC EXAMINATION

## 2024-08-02 LAB — URINALYSIS, COMPLETE
Bilirubin, UA: NEGATIVE
Glucose, UA: NEGATIVE
Leukocytes,UA: NEGATIVE
Nitrite, UA: NEGATIVE
Specific Gravity, UA: 1.03 (ref 1.005–1.030)
Urobilinogen, Ur: 0.2 mg/dL (ref 0.2–1.0)
pH, UA: 6 (ref 5.0–7.5)

## 2024-08-03 ENCOUNTER — Ambulatory Visit: Payer: Self-pay | Admitting: Urology

## 2024-08-03 LAB — PSA: Prostate Specific Ag, Serum: <0.1 ng/mL (ref 0.0–4.0)

## 2024-08-11 ENCOUNTER — Ambulatory Visit: Admitting: Physician Assistant

## 2024-08-11 VITALS — BP 177/82 | HR 80 | Ht 66.0 in | Wt 154.0 lb

## 2024-08-11 DIAGNOSIS — C61 Malignant neoplasm of prostate: Secondary | ICD-10-CM

## 2024-08-11 DIAGNOSIS — R3129 Other microscopic hematuria: Secondary | ICD-10-CM | POA: Diagnosis not present

## 2024-08-11 NOTE — Progress Notes (Signed)
 08/11/2024 10:44 AM   Allen Massey 29-Aug-1951 [id number]  CC: Chief Complaint  Patient presents with   Follow-up   HPI: Allen Massey is a 73 y.o. male with PMH prostate cancer s/p prostatectomy, ADT, and salvage radiation; ED; nephrolithiasis; recurrent epididymoorchitis; and recurrent balanoposthitis who presents today for annual follow-up.   Today he reports no acute urologic concerns.  He denies gross hematuria, dysuria, or groin swelling/pain.  PSA remains undetectable as of last week.  He had a UA last week that was notable for microscopic hematuria, which has been persistent for the past year.  He had a CT stone study on 02/12/2023, which showed nephrolithiasis and circumferential bladder wall thickening with no evidence of renal masses.  PMH: Past Medical History:  Diagnosis Date   Cancer (HCC)    PROSTATE   Diabetes mellitus without complication (HCC)    Hyperlipidemia    Hypertension    Stroke [facility]) 1990'S   MINI STROKE    Surgical History: Past Surgical History:  Procedure Laterality Date   Colon cancer removal  10/30/2019   NO PAST SURGERIES     PELVIC LYMPH NODE DISSECTION Bilateral 10/30/2019   Procedure: PELVIC LYMPH NODE DISSECTION;  Surgeon: Penne Knee, MD;  Location: [facility] ORS;  Service: Urology;  Laterality: Bilateral;   ROBOT ASSISTED LAPAROSCOPIC RADICAL PROSTATECTOMY N/A 10/30/2019   Procedure: XI ROBOTIC ASSISTED LAPAROSCOPIC RADICAL PROSTATECTOMY;  Surgeon: Penne Knee, MD;  Location: [facility] ORS;  Service: Urology;  Laterality: N/A;   UMBILICAL HERNIA REPAIR N/A 10/30/2019   Procedure: HERNIA REPAIR UMBILICAL ADULT;  Surgeon: Penne Knee, MD;  Location: [facility] ORS;  Service: Urology;  Laterality: N/A;    Home Medications:  Allergies as of 08/11/2024   No Known Allergies      Medication List        Accurate as of August 11, 2024 10:44 AM. If you have any questions, ask your nurse or doctor.          aspirin  EC 81 MG  tablet Take 1 tablet (81 mg total) by mouth daily.   atorvastatin  80 MG tablet Commonly known as: LIPITOR Take 1 tablet (80 mg total) by mouth daily.   brimonidine  0.2 % ophthalmic solution Commonly known as: ALPHAGAN  Place 1 drop into both eyes 3 (three) times daily.   carvedilol  6.25 MG tablet Commonly known as: COREG  Take 1 tablet (6.25 mg total) by mouth 2 (two) times daily.   cilostazol  100 MG tablet Commonly known as: PLETAL  Take 1 tablet (100 mg total) by mouth 2 (two) times daily.   dorzolamide -timolol  2-0.5 % ophthalmic solution Commonly known as: COSOPT  Place 1 drop into both eyes 2 (two) times daily.   latanoprost  0.005 % ophthalmic solution Commonly known as: XALATAN  Place 1 drop into both eyes at bedtime.   levofloxacin  500 MG tablet Commonly known as: LEVAQUIN  Take 1 tablet (500 mg total) by mouth daily.   losartan  25 MG tablet Commonly known as: COZAAR  Take 1 tablet (25 mg total) by mouth every morning.   metFORMIN 1000 MG tablet Commonly known as: GLUCOPHAGE Take 1,000 mg by mouth 2 (two) times daily.   nitroGLYCERIN  0.4 MG SL tablet Commonly known as: NITROSTAT  Place 1 tablet (0.4 mg total) under the tongue every 5 (five) minutes as needed for chest pain.   sildenafil  20 MG tablet Commonly known as: Revatio  Take 1 tablet (20 mg total) by mouth as needed. Take 1-5 tabs as needed prior to intercourse  Allergies:  No Known Allergies  Family History: Family History  Problem Relation Age of Onset   Diabetes Brother    Kidney disease Brother     Social History:   reports that he has been smoking cigarettes. He has a 12 pack-year smoking history. He has never used smokeless tobacco. He reports current alcohol use of about 14.0 standard drinks of alcohol per week. He reports that he does not use drugs.  Physical Exam: BP (!) 177/82   Pulse 80   Ht 5' 6 (1.676 m)   Wt 154 lb (69.9 kg)   BMI 24.86 kg/m   Constitutional:  Alert and  oriented, no acute distress, nontoxic appearing HEENT: , AT Cardiovascular: No clubbing, cyanosis, or edema Respiratory: Normal respiratory effort, no increased work of breathing Skin: No rashes, bruises or suspicious lesions Neurologic: Grossly intact, no focal deficits, moving all 4 extremities Psychiatric: Normal mood and affect  Laboratory Data: Results for orders placed or performed in visit on 08/02/24  PSA   Collection Time: 08/02/24 10:41 AM  Result Value Ref Range   Prostate Specific Ag, Serum <0.1 0.0 - 4.0 ng/mL  Microscopic Examination   Collection Time: 08/02/24 10:52 AM   Urine  Result Value Ref Range   WBC, UA 0-5 0 - 5 /hpf   RBC, Urine 3-10 (A) 0 - 2 /hpf   Epithelial Cells (non renal) 0-10 0 - 10 /hpf   Casts Present (A) None seen /lpf   Cast Type Hyaline casts N/A   Crystals Present (A) N/A   Mucus, UA Present (A) Not Estab.   Bacteria, UA Few None seen/Few  Urinalysis, Complete   Collection Time: 08/02/24 10:52 AM  Result Value Ref Range   Specific Gravity, UA 1.030 1.005 - 1.030   pH, UA 6.0 5.0 - 7.5   Color, UA Yellow Yellow   Appearance Ur Clear Clear   Leukocytes,UA Negative Negative   Protein,UA 3+ (A) Negative/Trace   Glucose, UA Negative Negative   Ketones, UA Trace (A) Negative   RBC, UA 1+ (A) Negative   Bilirubin, UA Negative Negative   Urobilinogen, Ur 0.2 0.2 - 1.0 mg/dL   Nitrite, UA Negative Negative   Microscopic Examination See below:    Assessment & Plan:   1. Prostate cancer (HCC) (Primary) He has a remains undetectable, will continue to monitor.  2. Microscopic hematuria Persistent microscopic hematuria, I previously felt this was likely due to sample contamination due to a phimotic foreskin.  I offered him cystoscopy, but he prefers to defer this.  Will get a urine cytology and consider cystoscopy based on results.  Return in about 2 weeks (around 08/25/2024) for lab visit for urine cytology.  Lucie Hones,  PA-C  [facility] [place] [address], [address] [place], [place] [address] [phone]

## 2024-08-24 ENCOUNTER — Other Ambulatory Visit: Payer: Self-pay

## 2024-08-24 DIAGNOSIS — C61 Malignant neoplasm of prostate: Secondary | ICD-10-CM

## 2024-08-25 ENCOUNTER — Other Ambulatory Visit

## 2024-11-10 ENCOUNTER — Emergency Department

## 2024-11-10 ENCOUNTER — Inpatient Hospital Stay: Admit: 2024-11-10

## 2024-11-10 ENCOUNTER — Encounter: Payer: Self-pay | Admitting: Emergency Medicine

## 2024-11-10 ENCOUNTER — Other Ambulatory Visit: Payer: Self-pay

## 2024-11-10 ENCOUNTER — Inpatient Hospital Stay
Admission: EM | Admit: 2024-11-10 | Source: Home / Self Care | Attending: Internal Medicine | Admitting: Internal Medicine

## 2024-11-10 DIAGNOSIS — J9601 Acute respiratory failure with hypoxia: Principal | ICD-10-CM | POA: Diagnosis present

## 2024-11-10 DIAGNOSIS — I5021 Acute systolic (congestive) heart failure: Secondary | ICD-10-CM

## 2024-11-10 DIAGNOSIS — I10 Essential (primary) hypertension: Secondary | ICD-10-CM | POA: Diagnosis present

## 2024-11-10 DIAGNOSIS — J189 Pneumonia, unspecified organism: Secondary | ICD-10-CM

## 2024-11-10 DIAGNOSIS — E872 Acidosis, unspecified: Secondary | ICD-10-CM

## 2024-11-10 DIAGNOSIS — E785 Hyperlipidemia, unspecified: Secondary | ICD-10-CM | POA: Insufficient documentation

## 2024-11-10 DIAGNOSIS — N179 Acute kidney failure, unspecified: Secondary | ICD-10-CM

## 2024-11-10 DIAGNOSIS — J810 Acute pulmonary edema: Secondary | ICD-10-CM

## 2024-11-10 DIAGNOSIS — I214 Non-ST elevation (NSTEMI) myocardial infarction: Secondary | ICD-10-CM | POA: Insufficient documentation

## 2024-11-10 DIAGNOSIS — E119 Type 2 diabetes mellitus without complications: Secondary | ICD-10-CM

## 2024-11-10 DIAGNOSIS — I5033 Acute on chronic diastolic (congestive) heart failure: Secondary | ICD-10-CM

## 2024-11-10 HISTORY — DX: Atherosclerotic heart disease of native coronary artery without angina pectoris: I25.10

## 2024-11-10 HISTORY — DX: Malignant neoplasm of prostate: C61

## 2024-11-10 HISTORY — DX: Alcohol abuse, uncomplicated: F10.10

## 2024-11-10 HISTORY — DX: Cocaine abuse, uncomplicated: F14.10

## 2024-11-10 HISTORY — DX: Tobacco use: Z72.0

## 2024-11-10 HISTORY — DX: Unspecified systolic (congestive) heart failure: I50.20

## 2024-11-10 LAB — URINALYSIS, W/ REFLEX TO CULTURE (INFECTION SUSPECTED)
Bilirubin Urine: NEGATIVE
Glucose, UA: NEGATIVE mg/dL
Ketones, ur: NEGATIVE mg/dL
Leukocytes,Ua: NEGATIVE
Nitrite: NEGATIVE
Protein, ur: 100 mg/dL — AB
Specific Gravity, Urine: 1.009 (ref 1.005–1.030)
Squamous Epithelial / HPF: 0 /HPF (ref 0–5)
pH: 5 (ref 5.0–8.0)

## 2024-11-10 LAB — BASIC METABOLIC PANEL WITH GFR
Anion gap: 12 (ref 5–15)
BUN: 43 mg/dL — ABNORMAL HIGH (ref 8–23)
CO2: 19 mmol/L — ABNORMAL LOW (ref 22–32)
Calcium: 7.9 mg/dL — ABNORMAL LOW (ref 8.9–10.3)
Chloride: 108 mmol/L (ref 98–111)
Creatinine, Ser: 3.06 mg/dL — ABNORMAL HIGH (ref 0.61–1.24)
GFR, Estimated: 21 mL/min — ABNORMAL LOW
Glucose, Bld: 105 mg/dL — ABNORMAL HIGH (ref 70–99)
Potassium: 5.4 mmol/L — ABNORMAL HIGH (ref 3.5–5.1)
Sodium: 138 mmol/L (ref 135–145)

## 2024-11-10 LAB — ECHOCARDIOGRAM COMPLETE
AR max vel: 2.05 cm2
AV Area VTI: 1.99 cm2
AV Area mean vel: 1.92 cm2
AV Mean grad: 2 mmHg
AV Peak grad: 4.1 mmHg
Ao pk vel: 1.01 m/s
Area-P 1/2: 6.65 cm2
Calc EF: 35 %
Height: 66 in
S' Lateral: 3.6 cm
Single Plane A2C EF: 38.2 %
Single Plane A4C EF: 36.3 %
Weight: 2550.28 [oz_av]

## 2024-11-10 LAB — COMPREHENSIVE METABOLIC PANEL WITH GFR
ALT: 8 U/L (ref 0–44)
AST: 35 U/L (ref 15–41)
Albumin: 3.5 g/dL (ref 3.5–5.0)
Alkaline Phosphatase: 72 U/L (ref 38–126)
Anion gap: 14 (ref 5–15)
BUN: 39 mg/dL — ABNORMAL HIGH (ref 8–23)
CO2: 19 mmol/L — ABNORMAL LOW (ref 22–32)
Calcium: 8.3 mg/dL — ABNORMAL LOW (ref 8.9–10.3)
Chloride: 107 mmol/L (ref 98–111)
Creatinine, Ser: 3.06 mg/dL — ABNORMAL HIGH (ref 0.61–1.24)
GFR, Estimated: 21 mL/min — ABNORMAL LOW
Glucose, Bld: 149 mg/dL — ABNORMAL HIGH (ref 70–99)
Potassium: 4.9 mmol/L (ref 3.5–5.1)
Sodium: 140 mmol/L (ref 135–145)
Total Bilirubin: 0.3 mg/dL (ref 0.0–1.2)
Total Protein: 7.3 g/dL (ref 6.5–8.1)

## 2024-11-10 LAB — CBC WITH DIFFERENTIAL/PLATELET
Abs Immature Granulocytes: 0.02 10*3/uL (ref 0.00–0.07)
Basophils Absolute: 0 10*3/uL (ref 0.0–0.1)
Basophils Relative: 1 %
Eosinophils Absolute: 0.1 10*3/uL (ref 0.0–0.5)
Eosinophils Relative: 1 %
HCT: 42.4 % (ref 39.0–52.0)
Hemoglobin: 13.4 g/dL (ref 13.0–17.0)
Immature Granulocytes: 0 %
Lymphocytes Relative: 24 %
Lymphs Abs: 2 10*3/uL (ref 0.7–4.0)
MCH: 29.6 pg (ref 26.0–34.0)
MCHC: 31.6 g/dL (ref 30.0–36.0)
MCV: 93.8 fL (ref 80.0–100.0)
Monocytes Absolute: 0.5 10*3/uL (ref 0.1–1.0)
Monocytes Relative: 6 %
Neutro Abs: 5.6 10*3/uL (ref 1.7–7.7)
Neutrophils Relative %: 68 %
Platelets: 288 10*3/uL (ref 150–400)
RBC: 4.52 MIL/uL (ref 4.22–5.81)
RDW: 12.9 % (ref 11.5–15.5)
WBC: 8.3 10*3/uL (ref 4.0–10.5)
nRBC: 0 % (ref 0.0–0.2)

## 2024-11-10 LAB — CBG MONITORING, ED
Glucose-Capillary: 88 mg/dL (ref 70–99)
Glucose-Capillary: 99 mg/dL (ref 70–99)

## 2024-11-10 LAB — CBC
HCT: 38.4 % — ABNORMAL LOW (ref 39.0–52.0)
Hemoglobin: 12.3 g/dL — ABNORMAL LOW (ref 13.0–17.0)
MCH: 29.9 pg (ref 26.0–34.0)
MCHC: 32 g/dL (ref 30.0–36.0)
MCV: 93.4 fL (ref 80.0–100.0)
Platelets: 236 10*3/uL (ref 150–400)
RBC: 4.11 MIL/uL — ABNORMAL LOW (ref 4.22–5.81)
RDW: 12.9 % (ref 11.5–15.5)
WBC: 8.4 10*3/uL (ref 4.0–10.5)
nRBC: 0 % (ref 0.0–0.2)

## 2024-11-10 LAB — HEMOGLOBIN A1C
Hgb A1c MFr Bld: 5.8 % — ABNORMAL HIGH (ref 4.8–5.6)
Mean Plasma Glucose: 119.76 mg/dL

## 2024-11-10 LAB — HEPARIN LEVEL (UNFRACTIONATED): Heparin Unfractionated: >1.1 [IU]/mL — ABNORMAL HIGH (ref 0.30–0.70)

## 2024-11-10 LAB — PROTIME-INR
INR: 0.9 (ref 0.8–1.2)
Prothrombin Time: 12.9 s (ref 11.4–15.2)

## 2024-11-10 LAB — GLUCOSE, CAPILLARY
Glucose-Capillary: 114 mg/dL — ABNORMAL HIGH (ref 70–99)
Glucose-Capillary: 182 mg/dL — ABNORMAL HIGH (ref 70–99)

## 2024-11-10 LAB — RESP PANEL BY RT-PCR (RSV, FLU A&B, COVID)  RVPGX2
Influenza A by PCR: NEGATIVE
Influenza B by PCR: NEGATIVE
Resp Syncytial Virus by PCR: NEGATIVE
SARS Coronavirus 2 by RT PCR: NEGATIVE

## 2024-11-10 LAB — CULTURE, BLOOD (ROUTINE X 2)
Culture: NO GROWTH
Culture: NO GROWTH

## 2024-11-10 LAB — LACTIC ACID, PLASMA
Lactic Acid, Venous: 2.4 mmol/L (ref 0.5–1.9)
Lactic Acid, Venous: 1.5 mmol/L (ref 0.5–1.9)

## 2024-11-10 LAB — TROPONIN T, HIGH SENSITIVITY
Troponin T High Sensitivity: 150 ng/L (ref 0–19)
Troponin T High Sensitivity: 332 ng/L (ref 0–19)
Troponin T High Sensitivity: 719 ng/L (ref 0–19)
Troponin T High Sensitivity: 825 ng/L (ref 0–19)

## 2024-11-10 LAB — APTT: aPTT: 28 s (ref 24–36)

## 2024-11-10 LAB — PRO BRAIN NATRIURETIC PEPTIDE: Pro Brain Natriuretic Peptide: 17375 pg/mL — ABNORMAL HIGH

## 2024-11-10 MED ORDER — NITROGLYCERIN 2 % TD OINT
1.0000 [in_us] | TOPICAL_OINTMENT | Freq: Once | TRANSDERMAL | Status: AC
  Administered 2024-11-10: 1 [in_us] via TOPICAL
  Filled 2024-11-10: qty 1

## 2024-11-10 MED ORDER — LATANOPROST 0.005 % OP SOLN
1.0000 [drp] | Freq: Every day | OPHTHALMIC | Status: AC
  Administered 2024-11-10: 1 [drp] via OPHTHALMIC
  Filled 2024-11-10: qty 2.5

## 2024-11-10 MED ORDER — ACETAMINOPHEN 325 MG PO TABS: 650.0000 mg | ORAL_TABLET | Freq: Four times a day (QID) | ORAL | Status: AC | PRN

## 2024-11-10 MED ORDER — MORPHINE SULFATE (PF) 2 MG/ML IV SOLN: 2.0000 mg | INTRAVENOUS | Status: AC | PRN

## 2024-11-10 MED ORDER — SODIUM CHLORIDE 0.9 % IV SOLN
100.0000 mg | Freq: Two times a day (BID) | INTRAVENOUS | Status: AC
  Administered 2024-11-10 (×2): 100 mg via INTRAVENOUS
  Filled 2024-11-10 (×4): qty 100

## 2024-11-10 MED ORDER — IPRATROPIUM-ALBUTEROL 0.5-2.5 (3) MG/3ML IN SOLN
3.0000 mL | Freq: Four times a day (QID) | RESPIRATORY_TRACT | Status: AC
  Administered 2024-11-10 (×4): 3 mL via RESPIRATORY_TRACT
  Filled 2024-11-10 (×4): qty 3

## 2024-11-10 MED ORDER — FUROSEMIDE 10 MG/ML IJ SOLN
40.0000 mg | Freq: Once | INTRAMUSCULAR | Status: AC
  Administered 2024-11-10: 40 mg via INTRAVENOUS
  Filled 2024-11-10: qty 4

## 2024-11-10 MED ORDER — HEPARIN BOLUS VIA INFUSION
4000.0000 [IU] | Freq: Once | INTRAVENOUS | Status: AC
  Administered 2024-11-10: 4000 [IU] via INTRAVENOUS
  Filled 2024-11-10: qty 4000

## 2024-11-10 MED ORDER — TRAZODONE HCL 50 MG PO TABS: 25.0000 mg | ORAL_TABLET | Freq: Every evening | ORAL | Status: AC | PRN

## 2024-11-10 MED ORDER — HEPARIN (PORCINE) 25000 UT/250ML-% IV SOLN
750.0000 [IU]/h | INTRAVENOUS | Status: AC
  Administered 2024-11-10: 750 [IU]/h via INTRAVENOUS
  Filled 2024-11-10: qty 250

## 2024-11-10 MED ORDER — DORZOLAMIDE HCL-TIMOLOL MAL 2-0.5 % OP SOLN
1.0000 [drp] | Freq: Two times a day (BID) | OPHTHALMIC | Status: AC
  Administered 2024-11-10: 1 [drp] via OPHTHALMIC
  Filled 2024-11-10: qty 10

## 2024-11-10 MED ORDER — IPRATROPIUM-ALBUTEROL 0.5-2.5 (3) MG/3ML IN SOLN: 3.0000 mL | RESPIRATORY_TRACT | Status: AC | PRN

## 2024-11-10 MED ORDER — CARVEDILOL 6.25 MG PO TABS
6.2500 mg | ORAL_TABLET | Freq: Two times a day (BID) | ORAL | Status: AC
  Administered 2024-11-10: 6.25 mg via ORAL
  Filled 2024-11-10: qty 1

## 2024-11-10 MED ORDER — TAMSULOSIN HCL 0.4 MG PO CAPS
0.4000 mg | ORAL_CAPSULE | Freq: Every day | ORAL | Status: AC
  Administered 2024-11-10: 0.4 mg via ORAL
  Filled 2024-11-10: qty 1

## 2024-11-10 MED ORDER — SODIUM CHLORIDE 0.9 % IV SOLN
100.0000 mg | Freq: Two times a day (BID) | INTRAVENOUS | Status: DC
  Filled 2024-11-10: qty 100

## 2024-11-10 MED ORDER — MAGNESIUM HYDROXIDE 400 MG/5ML PO SUSP: 30.0000 mL | Freq: Every day | ORAL | Status: AC | PRN

## 2024-11-10 MED ORDER — ATORVASTATIN CALCIUM 80 MG PO TABS: 80.0000 mg | ORAL_TABLET | Freq: Every day | ORAL | Status: AC

## 2024-11-10 MED ORDER — HEPARIN (PORCINE) 25000 UT/250ML-% IV SOLN
950.0000 [IU]/h | INTRAVENOUS | Status: DC
  Administered 2024-11-10: 950 [IU]/h via INTRAVENOUS
  Filled 2024-11-10: qty 250

## 2024-11-10 MED ORDER — ACETAMINOPHEN 650 MG RE SUPP: 650.0000 mg | Freq: Four times a day (QID) | RECTAL | Status: AC | PRN

## 2024-11-10 MED ORDER — CILOSTAZOL 100 MG PO TABS
100.0000 mg | ORAL_TABLET | Freq: Two times a day (BID) | ORAL | Status: AC
  Administered 2024-11-10: 100 mg via ORAL
  Filled 2024-11-10: qty 1

## 2024-11-10 MED ORDER — LOSARTAN POTASSIUM 25 MG PO TABS: 25.0000 mg | ORAL_TABLET | ORAL | Status: DC

## 2024-11-10 MED ORDER — ASPIRIN 81 MG PO CHEW
324.0000 mg | CHEWABLE_TABLET | Freq: Once | ORAL | Status: AC
  Administered 2024-11-10: 324 mg via ORAL
  Filled 2024-11-10: qty 4

## 2024-11-10 MED ORDER — GUAIFENESIN ER 600 MG PO TB12
600.0000 mg | ORAL_TABLET | Freq: Two times a day (BID) | ORAL | Status: AC
  Administered 2024-11-10 (×2): 600 mg via ORAL
  Filled 2024-11-10 (×2): qty 1

## 2024-11-10 MED ORDER — SODIUM CHLORIDE 0.9 % IV SOLN
2.0000 g | INTRAVENOUS | Status: AC
  Administered 2024-11-10: 2 g via INTRAVENOUS
  Filled 2024-11-10 (×2): qty 20

## 2024-11-10 MED ORDER — SODIUM CHLORIDE 0.9 % IV SOLN
1.0000 g | Freq: Once | INTRAVENOUS | Status: DC
  Filled 2024-11-10: qty 10

## 2024-11-10 MED ORDER — FUROSEMIDE 10 MG/ML IJ SOLN
40.0000 mg | Freq: Two times a day (BID) | INTRAMUSCULAR | Status: AC
  Administered 2024-11-10 (×2): 40 mg via INTRAVENOUS
  Filled 2024-11-10 (×2): qty 4

## 2024-11-10 MED ORDER — HYDROCOD POLI-CHLORPHE POLI ER 10-8 MG/5ML PO SUER: 5.0000 mL | Freq: Two times a day (BID) | ORAL | Status: AC | PRN

## 2024-11-10 MED ORDER — INSULIN ASPART 100 UNIT/ML IJ SOLN: 0.0000 [IU] | Freq: Three times a day (TID) | INTRAMUSCULAR | Status: AC

## 2024-11-10 MED ORDER — BRIMONIDINE TARTRATE 0.2 % OP SOLN
1.0000 [drp] | Freq: Three times a day (TID) | OPHTHALMIC | Status: AC
  Administered 2024-11-10 (×2): 1 [drp] via OPHTHALMIC
  Filled 2024-11-10: qty 5

## 2024-11-10 MED ORDER — ASPIRIN 81 MG PO TBEC: 81.0000 mg | DELAYED_RELEASE_TABLET | Freq: Every day | ORAL | Status: AC

## 2024-11-10 MED ORDER — ONDANSETRON HCL 4 MG PO TABS: 4.0000 mg | ORAL_TABLET | Freq: Four times a day (QID) | ORAL | Status: AC | PRN

## 2024-11-10 MED ORDER — NITROGLYCERIN 0.4 MG SL SUBL: 0.4000 mg | SUBLINGUAL_TABLET | SUBLINGUAL | Status: AC | PRN

## 2024-11-10 MED ORDER — ISOSORB DINITRATE-HYDRALAZINE 20-37.5 MG PO TABS
1.0000 | ORAL_TABLET | Freq: Three times a day (TID) | ORAL | Status: AC
  Administered 2024-11-10: 1 via ORAL
  Filled 2024-11-10 (×3): qty 1

## 2024-11-10 MED ORDER — ONDANSETRON HCL 4 MG/2ML IJ SOLN: 4.0000 mg | Freq: Four times a day (QID) | INTRAMUSCULAR | Status: AC | PRN

## 2024-11-10 MED ORDER — INSULIN ASPART 100 UNIT/ML IJ SOLN: 0.0000 [IU] | Freq: Every day | INTRAMUSCULAR | Status: AC

## 2024-11-10 NOTE — ED Notes (Addendum)
 Pt taken off bipap to eat. Pt states that he doesn't need oxygen while he eats and will put the bipap back on after eating.

## 2024-11-10 NOTE — Assessment & Plan Note (Signed)
 -  Continue antihypertensive therapy ?

## 2024-11-10 NOTE — Progress Notes (Addendum)
 " Progress Note   Patient: Allen Massey FMW:[id number] DOB: [birth year] DOA: 11/10/2024     0 DOS: the patient was seen and examined on 11/10/2024   Brief hospital course: Patient has been admitted overnight for acute respiratory failure with hypoxia due to acute on chronic CHF or pulmonary edema as well as community-acquired pneumonia.  Initially requiring BiPAP but now transition to 2 L/min O2 nasal cannula Continue DuoNebs 4 times daily and q.4 hours p.r.n. On IV Rocephin  and doxycycline , IV Lasix  therapy Follow-up respiratory status  Assessment and Plan: Acute respiratory failure with hypoxia likely secondary to acute on chronic diastolic CHF as well as commune acquired pneumonia. Patient has been transition from BiPAP to 2 L/min O2 nasal cannula Continue to manage with IV Lasix  and IV antibiotics Follow-up respiratory status closely  Acute on chronic diastolic CHF (congestive heart failure) (HCC) Last 2D echo showed an EF of 65% with grade 1 diastolic dysfunction. Continue IV Lasix  40 mg twice daily Follow-up urine output, free water restriction Sodium restricted diet Follow-up with cardiology recommendations  CAP (community acquired pneumonia) Continue IV Rocephin  and doxycycline , follow-up blood cultures Continue DuoNebs 4 times daily and q.4 hours p.r.n. Mucolytic therapy  Elevated troponin likely from demand ischemia versus Non-STEMI Troponin rising from 150-> 332, repeat troponin Continue IV heparin  infusion IV morphine  as needed for chest pain Continue aspirin , Nitropaste, beta-blocker and high-dose statin Reviewed echocardiogram: Showed Left ventricular ejection fraction of 35 to 40%   Follow-up with cardiology recommendations Cardiac monitoring  Type 2 diabetes mellitus without complications (HCC): Well-controlled Sliding scale insulin  Need to hold metformin.  AKI/CKD: Creatinine at 3.06 Last baseline creatinine 1.74 in 2024 Follow-up renal function, cautious with  IV Lasix  therapy consult nephrology if there is any further rise in creatinine  Dyslipidemia: continue statin therapy.  Essential hypertension Continue antihypertensive therapy  DVT prophylaxis: Already on IV heparin  drip    Subjective: Patient reports shortness of breath, mild midsternal chest pain, mild cough, has swelling in legs Denies fever, chills Has generalized weakness Denies nausea, vomiting, abdominal pain  Physical Exam: Vitals:   11/10/24 0110 11/10/24 0258 11/10/24 0740 11/10/24 0950  BP: (!) 168/116 (!) 171/110  (!) 165/87  Pulse:  (!) 102 (!) 108 96  Resp:  (!) 30 (!) 29 (!) 24  Temp:    98.4 F (36.9 C)  TempSrc:    Oral  SpO2:    96%  Weight:      Height:       Physical Exam Constitutional:      Appearance: Normal appearance. He is ill-appearing.  HENT:     Head: Normocephalic and atraumatic.     Nose: Nose normal. No congestion.     Mouth/Throat:     Pharynx: Oropharynx is clear. No oropharyngeal exudate.  Eyes:     Extraocular Movements: Extraocular movements intact.     Conjunctiva/sclera: Conjunctivae normal.  Cardiovascular:     Rate and Rhythm: Normal rate and regular rhythm.     Pulses: Normal pulses.     Heart sounds: Normal heart sounds.  Pulmonary:     Effort: Mild tachypnea, on 2 L/min O2 nasal cannula    Breath sounds: Has bilateral rhonchi and crackles Abdominal:     General: Abdomen is flat. Bowel sounds are normal.     Palpations: Abdomen is soft.  Musculoskeletal:        General: Normal range of motion.     Cervical back: Normal range of motion and neck supple.  Right lower leg: 1+ pitting edema.     Left lower leg: 1+ pitting edema.  Skin:    General: Skin is warm and dry.     Capillary Refill: Capillary refill takes 2 to 3 seconds.  Neurological:     General: No focal deficit present.     Mental Status: He is alert and oriented to person, place, and time. Mental status is at baseline.  Psychiatric:        Mood and  Affect: Mood normal.        Behavior: Behavior normal.   Data Reviewed:  Latest Reference Range & Units 11/10/24 06:21  WBC 4.0 - 10.5 K/uL 8.4  RBC 4.22 - 5.81 MIL/uL 4.11 (L)  Hemoglobin 13.0 - 17.0 g/dL 87.6 (L)  HCT 60.9 - 47.9 % 38.4 (L)  MCV 80.0 - 100.0 fL 93.4  MCH 26.0 - 34.0 pg 29.9  MCHC 30.0 - 36.0 g/dL 67.9  RDW 88.4 - 84.4 % 12.9  Platelets 150 - 400 K/uL 236  nRBC 0.0 - 0.2 % 0.0  (L): Data is abnormally low  Latest Reference Range & Units 11/10/24 04:21 11/10/24 06:21  Sodium 135 - 145 mmol/L  138  Potassium 3.5 - 5.1 mmol/L  5.4 (H)  Chloride 98 - 111 mmol/L  108  CO2 22 - 32 mmol/L  19 (L)  Glucose 70 - 99 mg/dL  894 (H)  BUN 8 - 23 mg/dL  43 (H)  Creatinine 9.38 - 1.24 mg/dL  6.93 (H)  Calcium  8.9 - 10.3 mg/dL  7.9 (L)  Anion gap 5 - 15   12  GFR, Estimated >60 mL/min  21 (L)  Troponin T High Sensitivity 0 - 19 ng/L 332 (HH)   (HH): Data is critically high (H): Data is abnormally high (L): Data is abnormally low  Family Communication:   Disposition: Status is: Inpatient   Planned Discharge Destination: Home    Time spent: 50 minutes  Author: Terral DELENA Seashore, MD 11/10/2024 11:10 AM  For on call review [email or web address].  "

## 2024-11-10 NOTE — Consult Note (Addendum)
 "  Cardiology Consult    Patient ID: Allen Massey MRN: [id number], DOB/AGE: [birth year]   Admit date: 11/10/2024 Date of Consult: 11/10/2024  Primary Physician: [facility] Primary Cardiologist: Redell Cave, MD   Requesting Provider: FABIENE Seashore, MD  Patient Profile    Allen Massey is a 74 y.o. male with a history of three-vessel coronary calcifications noted on CT, diastolic dysfunction, hypertension, hyperlipidemia, diabetes, prostate cancer, TIA, and polysubstance abuse (alcohol, cocaine, tobacco), who is being seen today for the evaluation of CHF and non-STEMI at the request of Dr. Seashore.  Past Medical History   Subjective  Past Medical History:  Diagnosis Date   Alcohol abuse    Cocaine abuse (HCC)    Coronary artery calcification seen on CT scan    a. 11/2020 Noncontrast CT Chest: 3 vessel cor Ca2+; b. 03/2021 MV: No ischemia/infarct.   Diabetes mellitus without complication (HCC)    HFrEF (heart failure with reduced ejection fraction) (HCC)    a. 04/2021 Echo: EF 60-65%, no rwma, GrI DD, nl RV fxn, mild-mod AoV sclerosis; b. 11/2024 Echo: EF 35-40%, glob HK, mild LVH, GrII DD, low-nl RV fxn, triv MR, AoV sclerosis w/o stenosis.   Hyperlipidemia    Hypertension    Prostate cancer (HCC)    Stroke [facility]) 1990'S   MINI STROKE   Tobacco abuse     Past Surgical History:  Procedure Laterality Date   Colon cancer removal  10/30/2019   NO PAST SURGERIES     PELVIC LYMPH NODE DISSECTION Bilateral 10/30/2019   Procedure: PELVIC LYMPH NODE DISSECTION;  Surgeon: Penne Knee, MD;  Location: [facility] ORS;  Service: Urology;  Laterality: Bilateral;   ROBOT ASSISTED LAPAROSCOPIC RADICAL PROSTATECTOMY N/A 10/30/2019   Procedure: XI ROBOTIC ASSISTED LAPAROSCOPIC RADICAL PROSTATECTOMY;  Surgeon: Penne Knee, MD;  Location: [facility] ORS;  Service: Urology;  Laterality: N/A;   UMBILICAL HERNIA REPAIR N/A 10/30/2019   Procedure: HERNIA REPAIR UMBILICAL ADULT;  Surgeon:  Penne Knee, MD;  Location: [facility] ORS;  Service: Urology;  Laterality: N/A;     Allergies  Allergies[1]     History of Present Illness   74 y.o. male with a history of three-vessel coronary calcifications noted on CT, diastolic dysfunction, hypertension, hyperlipidemia, diabetes, prostate cancer, TIA, and polysubstance abuse (alcohol, cocaine, tobacco).  He was previously evaluated in 2022 after a CT scan showed three-vessel coronary calcification.  Lexiscan  Myoview  in June 2022 was low risk without ischemia or infarct.  Echocardiogram in July 2022 showed an EF of 60 to 65% with grade 1 diastolic dysfunction, and aortic valve sclerosis without stenosis.  Patient was last seen in cardiology clinic in October 2022.  He lives locally and drinks 1/5 of alcohol with a 40 ounce beer a few times a week.  He uses cocaine about twice a month.  He still smokes a few cigarettes a day.    He notes that over the past month or more, he has been experiencing dyspnea, wheezing, and cough with intermittent chest tightness.  He says he has chronic lower extremity swelling which is usually mild and has not seemed to change much recently.  In the setting of progressive dyspnea, he did develop orthopnea prompting him to present to the emergency department on November 10, 2024.  In the emergency department, he was afebrile but tachycardic (42), tachypneic, and hypertensive (168/116).  ECG showed sinus tachycardia, 115, LVH, inferolateral ST depression.  Oxygen saturation was 92% on BiPAP.  Lab work notable for acute on chronic  stage III kidney disease with  BUN/creatinine of 39/3.06, acidosis with bicarb of 19, lactate of 2.4, BNP 17,375, troponin 150.  Respiratory panel negative.  Chest x-ray showed focal airspace opacity in the left upper lung likely representing infection with diffuse interstitial opacities-mild edema versus infection/inflammation.  He was treated w/ ASA, ceftriaxone , doxycycline , nebulizers, lasix  40  IV, heparin , and nitropaste.  He has remained BiPAP.  Troponins have since risen further to 332  719.  He denies chest pain.  Echo was performed this morning and shows new LV dysfxn with an EF of 35-40%, glob HK, mild LVH, GrII DD, nl RV fxn, trivial MR, and aortic sclerosis.  Meds, FH, SH, ROS    Subjective   Inpatient Medications    [START ON 11/11/2024] aspirin  EC  81 mg Oral Daily   atorvastatin   80 mg Oral Daily   brimonidine   1 drop Both Eyes TID   carvedilol   6.25 mg Oral BID   cilostazol   100 mg Oral BID   dorzolamide -timolol   1 drop Both Eyes BID   furosemide   40 mg Intravenous Q12H   guaiFENesin   600 mg Oral BID   insulin  aspart  0-15 Units Subcutaneous TID WC   insulin  aspart  0-5 Units Subcutaneous QHS   ipratropium-albuterol   3 mL Nebulization QID   latanoprost   1 drop Both Eyes QHS   [START ON 11/11/2024] losartan   25 mg Oral BH-q7a   tamsulosin   0.4 mg Oral QPC supper    Family History    Family History  Problem Relation Age of Onset   Diabetes Brother    Kidney disease Brother    He indicated that the status of his brother is unknown.   Social History    Social History   Socioeconomic History   Marital status: Widowed    Spouse name: Not on file   Number of children: Not on file   Years of education: Not on file   Highest education level: Not on file  Occupational History   Not on file  Tobacco Use   Smoking status: Some Days    Current packs/day: 0.25    Average packs/day: 0.3 packs/day for 48.0 years (12.0 ttl pk-yrs)    Types: Cigarettes   Smokeless tobacco: Never  Vaping Use   Vaping status: Never Used  Substance and Sexual Activity   Alcohol use: Yes    Alcohol/week: 14.0 standard drinks of alcohol    Types: 14 Shots of liquor per week    Comment: drinks a 40 oz beer several x/wk (11/2024)   Drug use: Yes    Types: Cocaine    Comment: Last used 11/2024   Sexual activity: Not Currently    Birth control/protection: None  Other Topics Concern    Not on file  Social History Narrative   Not on file   Social Drivers of Health   Tobacco Use: High Risk (11/10/2024)   Patient History    Smoking Tobacco Use: Some Days    Smokeless Tobacco Use: Never    Passive Exposure: Not on file  Financial Resource Strain: High Risk (04/24/2024)   Received from [facility]   Overall Financial Resource Strain (CARDIA)    Difficulty of Paying Living Expenses: Hard  Food Insecurity: Food Insecurity Present (04/24/2024)   Received from [facility]   Epic    Within the past 12 months, you worried that your food would run out before you got the money to buy more.: Sometimes  true    Within the past 12 months, the food you bought just didn't last and you didn't have money to get more.: Sometimes true  Transportation Needs: No Transportation Needs (04/24/2024)   Received from [facility] - Transportation    In the past 12 months, has lack of transportation kept you from medical appointments or from getting medications?: No    Lack of Transportation (Non-Medical): No  Physical Activity: Not on file  Stress: Not on file  Social Connections: Not on file  Intimate Partner Violence: Not on file  Depression (EYV7-0): Not on file  Alcohol Screen: Not on file  Housing: Low Risk  (04/24/2024)   Received from [facility]   Epic    In the last 12 months, was there a time when you were not able to pay the mortgage or rent on time?: No    In the past 12 months, how many times have you moved where you were living?: 0    At any time in the past 12 months, were you homeless or living in a shelter (including now)?: No  Utilities: Not At Risk (04/24/2024)   Received from [facility]   Epic    In the past 12 months has the electric, gas, oil, or water company threatened to shut off services in your home?: No  Health Literacy: Not on file     Review of Systems     General:  No chills, fever, night sweats or weight changes.  Cardiovascular:  +++ chest pain, +++ dyspnea on exertion, +++ mild lower ext edema, +++ orthopnea, no palpitations, +++ paroxysmal nocturnal dyspnea. Dermatological: No rash, lesions/masses Respiratory: +++ cough, +++ dyspnea Urologic: No hematuria, dysuria Abdominal:   No nausea, vomiting, diarrhea, bright red blood per rectum, melena, or hematemesis Neurologic:  No visual changes, wkns, changes in mental status. All other systems reviewed and are otherwise negative except as noted above.     Exam, Labs, Other  Objective   Physical Exam    Blood pressure (!) 157/98, pulse 94, temperature 97.6 F (36.4 C), resp. rate 18, height 5' 6 (1.676 m), weight 72.3 kg, SpO2 98%.  General: Pleasant, NAD Psych: Normal affect. Neuro: Alert and oriented X 3. Moves all extremities spontaneously. HEENT: Normal  Neck: Supple without bruits or JVD. Lungs:  Resp regular and unlabored, diminished breath sounds bilaterally. Heart: RRR no s3, s4, or murmurs. Abdomen: Soft, non-tender, non-distended, BS + x 4.  Extremities: No clubbing, cyanosis.  Trace bilateral ankle edema. DP/PT2+, Radials 2+ and equal bilaterally.  Labs    Recent Labs  Lab 11/10/24 0202 11/10/24 0421 11/10/24 1241  TRNPT 150* 332* 719*   ProBNP    Component Value Date/Time   PROBNP 17,375.0 (H) 11/10/2024 0202    Lab Results  Component Value Date   WBC 8.4 11/10/2024   HGB 12.3 (L) 11/10/2024   HCT 38.4 (L) 11/10/2024   MCV 93.4 11/10/2024   PLT 236 11/10/2024    Recent Labs  Lab 11/10/24 0202 11/10/24 0621  NA 140 138  K 4.9 5.4*  CL 107 108  CO2 19* 19*  BUN 39* 43*  CREATININE 3.06* 3.06*  CALCIUM  8.3* 7.9*  PROT 7.3  --   BILITOT 0.3  --   ALKPHOS 72  --   ALT 8  --   AST 35  --   GLUCOSE 149* 105*   Lab Results  Component Value Date  CHOL 152 04/21/2021   HDL 50 04/21/2021   LDLCALC 88 04/21/2021   TRIG 72 04/21/2021      Radiology Studies    DG Chest Port 1 View Result Date: 11/10/2024 EXAM: 1 VIEW(S) XRAY OF THE CHEST 11/10/2024 02:25:00 AM COMPARISON: CXR 07/26/2022. CLINICAL HISTORY: FINDINGS: LUNGS AND PLEURA: Diffuse interstitial opacities. Focal airspace opacity in left upper lung likely infection. No pleural effusion. No pneumothorax. HEART AND MEDIASTINUM: No acute abnormality of the cardiac and mediastinal silhouettes. BONES AND SOFT TISSUES: Old healed right rib fracture. IMPRESSION: 1. Focal airspace opacity in the left upper lung, likely representing infection. 2. Diffuse interstitial opacities may represent mild edema or infection/inflammation . Electronically signed by: Greig Pique MD 11/10/2024 02:37 AM EST RP Workstation: [id number]      ECG & Cardiac Imaging    sinus tachycardia, 115, LVH, inferolateral ST depression - personally reviewed.  Assessment & Plan    1.  Acute respiratory failure/CAP: Patient presented with a 1 month history of progressive dyspnea, cough, and wheezing.  Suggestion of left lung pneumonia on chest x-ray.  Antibiotics per primary team.  Requiring BiPAP.  2.  Non-STEMI: In the setting of above, troponin has risen to 719.  Patient has had intermittent chest pain but was pain-free upon our interview.  He has a prior history of three-vessel coronary calcifications on CT in 2022 with nonischemic Myoview  at that time.  Echo this admission with an EF of 35 to 40% with global hypokinesis.  Heparin  ordered.  Continue aspirin , statin, and beta-blocker therapy.  Pending recovery from pneumonia and improvement in renal function, would consider diagnostic catheterization to rule out obstructive coronary artery disease.  Adding long-acting nitrate in the form of BiDil .  3.  Acute HFrEF/cardiomyopathy: Previously normal EF by echo in 2022.  In setting of above, echo earlier today with EF of 35 to 40% with global hypokinesis, mild LVH, grade 2 diastolic dysfunction, normal RV function,  trivial MR, and aortic sclerosis.  ProBNP elevated at 17,375 with suggestion of edema on chest x-ray.  Trace ankle edema though he thinks he has had more pronounced swelling recently.  Monitor creatinine closely on IV Lasix  which is currently ordered as 40 mg every 12 hours.  Hold ARB in the setting of acute on chronic renal failure.  Continue beta-blocker.  Add BiDil .  As above, in setting of new LV dysfunction and troponin elevation, ideally would pursue diagnostic catheterization to evaluate coronary anatomy once pneumonia adequately treated and renal function stabilizes.  4.  Primary hypertension: Pressure elevated in the ED at 157/98.  Losartan  ordered and I will hold in the setting of worsening of renal failure and potassium of 5.4.  In light of reduced EF on echo, will add BiDil .  5.  Hyperlipidemia: LDL of 88 in 2022.  Follow-up lipids.  Continue high potency statin therapy.  6.  Type 2 diabetes mellitus: Insulin  management per medicine team.  Look to add SGLT2 inhibitor once renal function stabilizes.  7.  Polysubstance abuse: Drinking 40 ounce beer or liquor a few times a week (last yesterday), using cocaine at least 2 times a month (last about a week ago), and smoking a few cigarettes a day.  Discussed that this is likely contributing to his presentation and potentially his LV dysfunction.  Complete cessation advised.  May benefit from social work evaluation outpatient referral for cessation resources.  8.  Acute on chronic stage III kidney disease: BUN/creatinine 43/3.06 this morning with prior baseline of 1.4-1.7 in  2024.  Risk Assessment/Risk Scores:     TIMI Risk Score for Unstable Angina or Non-ST Elevation MI:   The patient's TIMI risk score is 3, which indicates a 13% risk of all cause mortality, new or recurrent myocardial infarction or need for urgent revascularization in the next 14 days.  New [place]  Heart Association (NYHA) Functional Class NYHA Class III     Signed, Lonni Meager, NP 11/10/2024, 4:17 PM  For questions or updates, please contact   Please consult [email or web address] for contact info under Cardiology/STEMI.       [1] No Known Allergies  "

## 2024-11-10 NOTE — ED Notes (Signed)
 Pt SpO2 at 87%. Pt doesn't want to wear bipap at this time. Pt put on 2LNC and SpO2 currently at 97%.

## 2024-11-10 NOTE — Plan of Care (Signed)
  Problem: Education: Goal: Ability to describe self-care measures that may prevent or decrease complications (Diabetes Survival Skills Education) will improve Outcome: Progressing   Problem: Skin Integrity: Goal: Risk for impaired skin integrity will decrease Outcome: Progressing   Problem: Education: Goal: Knowledge of General Education information will improve Description: Including pain rating scale, medication(s)/side effects and non-pharmacologic comfort measures Outcome: Progressing

## 2024-11-10 NOTE — Assessment & Plan Note (Signed)
-   The will be placed on supplemental coverage with NovoLog . - Would hold off metformin.

## 2024-11-10 NOTE — ED Provider Notes (Addendum)
 "  [facility] Provider Note    Event Date/Time   First MD Initiated Contact with Patient 11/10/24 0200     (approximate)   History   Respiratory Distress   HPI  Allen Massey is a 74 y.o. male   Past medical history of prostate cancer not on active treatment, diabetes, hypertension hyperlipidemia, prior stroke, here with shortness of breath.  Some chest tightness as well.  Started acutely at night.  Was in his regular state of health with no respiratory infectious symptoms preceding.  No abdominal pain.  No GU symptoms   Independent Historian contributed to assessment above: EMS reports hypoxemia to the 80s.  Put on CPAP with improvement.  Was given nitro prior to arrival.  No aspirin  given.    Physical Exam   Triage Vital Signs: ED Triage Vitals  Encounter Vitals Group     BP 11/10/24 0110 (!) 168/116     Girls Systolic BP Percentile --      Girls Diastolic BP Percentile --      Boys Systolic BP Percentile --      Boys Diastolic BP Percentile --      Pulse Rate 11/10/24 0105 (!) 115     Resp 11/10/24 0105 (!) 42     Temp 11/10/24 0109 (!) 97.5 F (36.4 C)     Temp Source 11/10/24 0109 Axillary     SpO2 11/10/24 0105 92 %     Weight 11/10/24 0107 159 lb 6.3 oz (72.3 kg)     Height 11/10/24 0107 5' 6 (1.676 m)     Head Circumference --      Peak Flow --      Pain Score 11/10/24 0110 10     Pain Loc --      Pain Education --      Exclude from Growth Chart --     Most recent vital signs: Vitals:   11/10/24 0110 11/10/24 0258  BP: (!) 168/116 (!) 171/110  Pulse:  (!) 102  Resp:  (!) 30  Temp:    SpO2:      General: Awake, no distress.  CV:  Good peripheral perfusion.  Resp:  Normal effort.  Abd:  No distention.  Other:  Mild peripheral edema, pitting edema to the ankles bilaterally.  Rales in all lung fields.  No focality on lung exam.  Breathing more comfortably on CPAP.  No hypoxemia on the CPAP.  Benign abdominal exam.  Radial  pulses intact and equal bilaterally.   ED Results / Procedures / Treatments   Labs (all labs ordered are listed, but only abnormal results are displayed) Labs Reviewed  PRO BRAIN NATRIURETIC PEPTIDE - Abnormal; Notable for the following components:      Result Value   Pro Brain Natriuretic Peptide 17,375.0 (*)    All other components within normal limits  COMPREHENSIVE METABOLIC PANEL WITH GFR - Abnormal; Notable for the following components:   CO2 19 (*)    Glucose, Bld 149 (*)    BUN 39 (*)    Creatinine, Ser 3.06 (*)    Calcium  8.3 (*)    GFR, Estimated 21 (*)    All other components within normal limits  LACTIC ACID, PLASMA - Abnormal; Notable for the following components:   Lactic Acid, Venous 2.4 (*)    All other components within normal limits  TROPONIN T, HIGH SENSITIVITY - Abnormal; Notable for the following components:   Troponin T High Sensitivity 150 (*)  All other components within normal limits  RESP PANEL BY RT-PCR (RSV, FLU A&B, COVID)  RVPGX2  CULTURE, BLOOD (ROUTINE X 2)  CULTURE, BLOOD (ROUTINE X 2)  CBC WITH DIFFERENTIAL/PLATELET  PROTIME-INR  LACTIC ACID, PLASMA  URINALYSIS, W/ REFLEX TO CULTURE (INFECTION SUSPECTED)  TROPONIN T, HIGH SENSITIVITY     I ordered and reviewed the above labs they are notable for lactic acidosis 2.4.  No leukocytosis.  EKG  ED ECG REPORT I, Ginnie Shams, the attending physician, personally viewed and interpreted this ECG.   Date: 11/10/2024  EKG Time: 0109  Rate: 113  Rhythm: sinus tachycardia  Axis: nl  Intervals:nl  ST&T Change: no stemi    RADIOLOGY I independently reviewed and interpreted chest x-ray and see diffuse opacities consistent with pulmonary edema I also reviewed radiologist's formal read.   PROCEDURES:  Critical Care performed: Yes, see critical care procedure note(s)  .Critical Care  Performed by: Shams Ginnie, MD Authorized by: Shams Ginnie, MD   Critical care provider statement:     Critical care time (minutes):  40   Critical care was time spent personally by me on the following activities:  Development of treatment plan with patient or surrogate, discussions with consultants, evaluation of patient's response to treatment, examination of patient, ordering and review of laboratory studies, ordering and review of radiographic studies, ordering and performing treatments and interventions, pulse oximetry, re-evaluation of patient's condition and review of old charts    MEDICATIONS ORDERED IN ED: Medications  cefTRIAXone  (ROCEPHIN ) 1 g in sodium chloride  0.9 % 100 mL IVPB (has no administration in time range)  doxycycline  (VIBRAMYCIN ) 100 mg in sodium chloride  0.9 % 250 mL IVPB (has no administration in time range)  nitroGLYCERIN  (NITROGLYN) 2 % ointment 1 inch (1 inch Topical Given 11/10/24 0217)  furosemide  (LASIX ) injection 40 mg (40 mg Intravenous Given 11/10/24 0216)  aspirin  chewable tablet 324 mg (324 mg Oral Given 11/10/24 0219)    External physician / consultants:  I spoke with hospital medicine for admission and regarding care plan for this patient.   IMPRESSION / MDM / ASSESSMENT AND PLAN / ED COURSE  I reviewed the triage vital signs and the nursing notes.                                Patient's presentation is most consistent with acute presentation with potential threat to life or bodily function.  Differential diagnosis includes, but is not limited to, flash pulmonary edema, ACS, respiratory infection, PE, pneumothorax, COPD or asthma exacerbation   The patient is on the cardiac monitor to evaluate for evidence of arrhythmia and/or significant heart rate changes.  MDM:    I think he is suffering from flash pulmonary edema hypertensive crisis given his noted peripheral edema, rales on auscultation, pulmonary edema on chest x-ray, improvement with nitroglycerin /BiPAP.  Ordered for 40 mg IV diuresis Lasix   He reported no respiratory infectious symptoms and  this was a very sudden acute onset of shortness of breath in the middle of the night tonight.  I doubt respiratory infection however with the chest x-ray showing a focal opacity in the left upper lung field along with pulmonary edema just changes, I started him on CAP coverage.  I am not giving him fluids because I think he is fluid overloaded.  Got sepsis labs including blood cultures, lactic acids.  Given aspirin .  Chest tightness with no frank chest pain doubt ACS  with a nonischemic EKG.  Will trend serial troponin.  Was hypoxemic prior to getting CPAP but better now.  More comfortable on BiPAP, much improved subjectively.  Admission to hospital service.   -- Continues to feel well.  Vital signs improving.  AKI noted on lab testing.  Also NSTEMI with a troponin of 150 in the setting of acute chest pain will cover with heparin .  Consulted with hospital medicine for admission.     FINAL CLINICAL IMPRESSION(S) / ED DIAGNOSES   Final diagnoses:  Acute hypoxemic respiratory failure (HCC)  Acute pulmonary edema (HCC)  Community acquired pneumonia of left upper lobe of lung  Lactic acidosis  AKI (acute kidney injury)  NSTEMI (non-ST elevated myocardial infarction) (HCC)     Rx / DC Orders   ED Discharge Orders     None        Note:  This document was prepared using Dragon voice recognition software and may include unintentional dictation errors.    Cyrena Mylar, MD 11/10/24 9741    Cyrena Mylar, MD 11/10/24 9741    Cyrena Mylar, MD 11/10/24 [phone]  "

## 2024-11-10 NOTE — Assessment & Plan Note (Signed)
-   Will continue antibiotic therapy with IV Rocephin  and doxycycline . - Mucolytic therapy be provided as well as duo nebs q.i.d. and q.4 hours p.r.n. - We will follow blood cultures.

## 2024-11-10 NOTE — H&P (Addendum)
 `     [place]   PATIENT NAME: Allen Massey    MR#:  [id number]  DATE OF BIRTH:  [birth year]  DATE OF ADMISSION:  11/10/2024  PRIMARY CARE PHYSICIAN: [facility]   Patient is coming from: Home  REQUESTING/REFERRING PHYSICIAN: Cyrena Mylar, MD  CHIEF COMPLAINT:   Chief Complaint  Patient presents with   Respiratory Distress    HISTORY OF PRESENT ILLNESS:  Allen Massey is a 74 y.o. African-American male with medical history significant for type diabetes mellitus, hypertension, dyslipidemia, CVA and prostate CA, who presented to the emergency room with acute onset of worsening dyspnea with associated dry cough and wheezing over the last couple of days.  The patient admitted to orthopnea and swelling is bilateral lower extremity edema, dyspnea on exertion and paroxysmal nocturnal dyspnea.  No fever or chills.  Chest pain or palpitations.  No nausea or vomiting or abdominal pain.  No dysuria, oliguria or hematuria or flank pain.  He was in significant respiratory distress upon arrival that he was placed on BiPAP.  ED Course: When he came to the ER, respiratory rate was 42 and heart rate was 115 with a temperature 97.5 and BP 168/116.  His pulse oximetry was 92% on BiPAP at 50% FiO2.  Labs revealed a CO2 of 19 and a blood glucose of 149, BUN of 39 and creatinine 3.06 compared to 21 and 1.74 on 05/26/2023, calcium  8.3.  proBNP was 17,375 and high sensitive troponin I was 115 and later 332. EKG as reviewed by me : EKG showed sinus tachycardia with a rate of 115 with biatrial enlargement and LVH with T wave inversion laterally. Imaging: Portable chest x-ray showed the following: 1. Focal airspace opacity in the left upper lung, likely representing infection. 2. Diffuse interstitial opacities may represent mild edema or infection/inflammation .  The patient was given initial Nitropaste, 40 mg of IV Lasix , hold baby aspirin  as well as IV Rocephin  and doxycycline .  He was  placed on IV heparin  with bolus and drip.  He will be admitted to a progressive unit bed for further evaluation and management. PAST MEDICAL HISTORY:   Past Medical History:  Diagnosis Date   Cancer (HCC)    PROSTATE   Diabetes mellitus without complication (HCC)    Hyperlipidemia    Hypertension    Stroke (HCC) 1990'S   MINI STROKE    PAST SURGICAL HISTORY:   Past Surgical History:  Procedure Laterality Date   Colon cancer removal  10/30/2019   NO PAST SURGERIES     PELVIC LYMPH NODE DISSECTION Bilateral 10/30/2019   Procedure: PELVIC LYMPH NODE DISSECTION;  Surgeon: Penne Knee, MD;  Location: [facility] ORS;  Service: Urology;  Laterality: Bilateral;   ROBOT ASSISTED LAPAROSCOPIC RADICAL PROSTATECTOMY N/A 10/30/2019   Procedure: XI ROBOTIC ASSISTED LAPAROSCOPIC RADICAL PROSTATECTOMY;  Surgeon: Penne Knee, MD;  Location: [facility] ORS;  Service: Urology;  Laterality: N/A;   UMBILICAL HERNIA REPAIR N/A 10/30/2019   Procedure: HERNIA REPAIR UMBILICAL ADULT;  Surgeon: Penne Knee, MD;  Location: [facility] ORS;  Service: Urology;  Laterality: N/A;    SOCIAL HISTORY:   Social History   Tobacco Use   Smoking status: Some Days    Current packs/day: 0.25    Average packs/day: 0.3 packs/day for 48.0 years (12.0 ttl pk-yrs)    Types: Cigarettes   Smokeless tobacco: Never  Substance Use Topics   Alcohol use: Yes    Alcohol/week: 14.0 standard drinks of alcohol    Types:  14 Shots of liquor per week    FAMILY HISTORY:   Family History  Problem Relation Age of Onset   Diabetes Brother    Kidney disease Brother     DRUG ALLERGIES:  Allergies[1]  REVIEW OF SYSTEMS:   ROS As per history of present illness. All pertinent systems were reviewed above. Constitutional, HEENT, cardiovascular, respiratory, GI, GU, musculoskeletal, neuro, psychiatric, endocrine, integumentary and hematologic systems were reviewed and are otherwise negative/unremarkable except for positive findings  mentioned above in the HPI.   MEDICATIONS AT HOME:   Prior to Admission medications  Medication Sig Start Date End Date Taking? Authorizing Provider  aspirin  EC 81 MG tablet Take 1 tablet (81 mg total) by mouth daily. 07/10/21   Furth, Cadence H, PA-C  atorvastatin  (LIPITOR) 80 MG tablet Take 1 tablet (80 mg total) by mouth daily. 02/26/21 01/27/23  Darliss Rogue, MD  brimonidine  (ALPHAGAN ) 0.2 % ophthalmic solution Place 1 drop into both eyes 3 (three) times daily.    [provider]  carvedilol  (COREG ) 6.25 MG tablet Take 1 tablet (6.25 mg total) by mouth 2 (two) times daily. 05/26/23   Floy Roberts, MD  cilostazol  (PLETAL ) 100 MG tablet Take 1 tablet (100 mg total) by mouth 2 (two) times daily. 04/21/21   Darliss Rogue, MD  dorzolamide -timolol  (COSOPT ) 22.3-6.8 MG/ML ophthalmic solution Place 1 drop into both eyes 2 (two) times daily. 04/25/19   [provider]  latanoprost  (XALATAN ) 0.005 % ophthalmic solution Place 1 drop into both eyes at bedtime. 04/25/19   [provider]  levofloxacin  (LEVAQUIN ) 500 MG tablet Take 1 tablet (500 mg total) by mouth daily. 07/23/23   Vaillancourt, Samantha, PA-C  losartan  (COZAAR ) 25 MG tablet Take 1 tablet (25 mg total) by mouth every morning. 05/26/23   Goodman, Graydon, MD  metFORMIN (GLUCOPHAGE) 1000 MG tablet Take 1,000 mg by mouth 2 (two) times daily.  04/12/19   [provider]  nitroGLYCERIN  (NITROSTAT ) 0.4 MG SL tablet Place 1 tablet (0.4 mg total) under the tongue every 5 (five) minutes as needed for chest pain. 07/10/21 10/08/21  Furth, Cadence H, PA-C  sildenafil  (REVATIO ) 20 MG tablet Take 1 tablet (20 mg total) by mouth as needed. Take 1-5 tabs as needed prior to intercourse 01/27/23   Penne Knee, MD  tamsulosin  (FLOMAX ) 0.4 MG CAPS capsule Take 0.4 mg by mouth. 02/12/23   [provider]      VITAL SIGNS:  Blood pressure (!) 171/110, pulse (!) 102, temperature (!) 97.5 F (36.4 C),  temperature source Axillary, resp. rate (!) 30, height 5' 6 (1.676 m), weight 72.3 kg, SpO2 92%.  PHYSICAL EXAMINATION:  Physical Exam  GENERAL: Acutely ill 74 y.o.-year-old African-American male patient lying in the bed with mild aspiratory distress with conversational dyspnea on BiPAP.   EYES: Pupils equal, round, reactive to light and accommodation. No scleral icterus. Extraocular muscles intact.  HEENT: Head atraumatic, normocephalic. Oropharynx and nasopharynx clear.  NECK:  Supple, no jugular venous distention. No thyroid enlargement, no tenderness.  LUNGS: Diminished bibasilar breath sounds with bibasal crackles.. No use of accessory muscles of respiration.  CARDIOVASCULAR: Regular rate and rhythm, S1, S2 normal. No murmurs, rubs, or gallops.  ABDOMEN: Soft, nondistended, nontender. Bowel sounds present. No organomegaly or mass.  EXTREMITIES: 1+ bilateral lower extremity pitting edema with no cyanosis, or clubbing.  NEUROLOGIC: Cranial nerves II through XII are intact. Muscle strength 5/5 in all extremities. Sensation intact. Gait not checked.  PSYCHIATRIC: The patient is alert and  oriented x 3.  Normal affect and good eye contact. SKIN: No obvious rash, lesion, or ulcer.   LABORATORY PANEL:   CBC Recent Labs  Lab 11/10/24 0202  WBC 8.3  HGB 13.4  HCT 42.4  PLT 288   ------------------------------------------------------------------------------------------------------------------  Chemistries  Recent Labs  Lab 11/10/24 0202  NA 140  K 4.9  CL 107  CO2 19*  GLUCOSE 149*  BUN 39*  CREATININE 3.06*  CALCIUM  8.3*  AST 35  ALT 8  ALKPHOS 72  BILITOT 0.3   ------------------------------------------------------------------------------------------------------------------  Cardiac Enzymes No results for input(s): TROPONINI in the last 168  hours. ------------------------------------------------------------------------------------------------------------------  RADIOLOGY:  DG Chest Port 1 View Result Date: 11/10/2024 EXAM: 1 VIEW(S) XRAY OF THE CHEST 11/10/2024 02:25:00 AM COMPARISON: CXR 07/26/2022. CLINICAL HISTORY: FINDINGS: LUNGS AND PLEURA: Diffuse interstitial opacities. Focal airspace opacity in left upper lung likely infection. No pleural effusion. No pneumothorax. HEART AND MEDIASTINUM: No acute abnormality of the cardiac and mediastinal silhouettes. BONES AND SOFT TISSUES: Old healed right rib fracture. IMPRESSION: 1. Focal airspace opacity in the left upper lung, likely representing infection. 2. Diffuse interstitial opacities may represent mild edema or infection/inflammation . Electronically signed by: Greig Pique MD 11/10/2024 02:37 AM EST RP Workstation: [id number]   IMPRESSION AND PLAN:  Assessment and Plan: * Acute respiratory failure with hypoxia (HCC) - Suspect secondary to acute on chronic diastolic CHF as well as commune acquired pneumonia. - The patient will be admitted to a progressive unit bed. - She will be continued on BiPAP. - Management otherwise as below.  Acute on chronic diastolic CHF (congestive heart failure) (HCC) - Last 2D echo showed an EF of 65% with grade 1 diastolic dysfunction. - The patient will be diuresed with IV Lasix . - Cardiology consult will be obtained. - I notified [facility] group  CAP (community acquired pneumonia) - Will continue antibiotic therapy with IV Rocephin  and doxycycline  - Mucolytic therapy be provided as well as duo nebs q.i.d. and q.4 hours p.r.n. - We will follow blood cultures.   Non-STEMI (non-ST elevated myocardial infarction) (HCC) - The patient will be continued on IV heparin . - High-dose statin therapy will be provided. - If he has chest pain as needed IV morphine  sulfate will be given. - Will continue aspirin . - Will continue Nitropaste. - 2D echo will be  obtained. - Cardiology consult will be obtained as mentioned above.  Type 2 diabetes mellitus without complications (HCC) - The will be placed on supplemental coverage with NovoLog . - Would hold off metformin.  Dyslipidemia - Will continue statin therapy.  Essential hypertension - Continue antihypertensive therapy   DVT prophylaxis: Lovenox.  Advanced Care Planning:  Code Status: full code.  Family Communication:  The plan of care was discussed in details with the patient (and family). I answered all questions. The patient agreed to proceed with the above mentioned plan. Further management will depend upon hospital course. Disposition Plan: Back to previous home environment Consults called: Cardiology All the records are reviewed and case discussed with ED provider.  Status is: Inpatient  At the time of the admission, it appears that the appropriate admission status for this patient is inpatient.  This is judged to be reasonable and necessary in order to provide the required intensity of service to ensure the patient's safety given the presenting symptoms, physical exam findings and initial radiographic and laboratory data in the context of comorbid conditions.  The patient requires inpatient status due to high intensity of service, high risk of further  deterioration and high frequency of surveillance required.  I certify that at the time of admission, it is my clinical judgment that the patient will require inpatient hospital care extending more than 2 midnights.                            Dispo: The patient is from: Home              Anticipated d/c is to: Home              Patient currently is not medically stable to d/c.              Difficult to place patient: No  Authorized and performed by: Madison Peaches, MD Total critical care time:  55      minutes. Due to a high probability of clinically significant, life-threatening deterioration, the patient required my highest level of  preparedness to intervene emergently and I personally spent this critical care time directly and personally managing the patient.  This critical care time included obtaining a history, examining the patient, pulse oximetry, ordering and review of studies, arranging urgent treatment with development of management plan, evaluation of patient's response to treatment, frequent reassessment, and discussions with other providers. This critical care time was performed to assess and manage the high probability of imminent, life-threatening deterioration that could result in multiorgan failure.  It was exclusive of separately billable procedures and treating other patients and teaching time.   Madison DELENA Peaches M.D on 11/10/2024 at 6:27 AM  [facility]   From 7 PM-7 AM, contact night-coverage [email or web address]  CC: Primary care physician; [facility]     [1] No Known Allergies

## 2024-11-10 NOTE — Assessment & Plan Note (Signed)
-   Suspect secondary to acute on chronic diastolic CHF as well as commune acquired pneumonia. - The patient will be admitted to a progressive unit bed. - She will be continued on BiPAP. - Management otherwise as below.

## 2024-11-10 NOTE — Progress Notes (Signed)
 ANTICOAGULATION CONSULT NOTE  Pharmacy Consult for heparin  infusion Indication: ACS/STEMI  Allergies[1]  Patient Measurements: Height: 5' 6 (167.6 cm) Weight: 72.3 kg (159 lb 6.3 oz) IBW/kg (Calculated) : 63.8 HEPARIN  DW (KG): 72.3  Vital Signs: Temp: 97.5 F (36.4 C) (02/06 0109) Temp Source: Axillary (02/06 0109) BP: 171/110 (02/06 0258) Pulse Rate: 102 (02/06 0258)  Labs: Recent Labs    11/10/24 0202  HGB 13.4  HCT 42.4  PLT 288  LABPROT 12.9  INR 0.9  CREATININE 3.06*    Estimated Creatinine Clearance: 19.1 mL/min (A) (by C-G formula based on SCr of 3.06 mg/dL (H)).   Medical History: Past Medical History:  Diagnosis Date   Cancer (HCC)    PROSTATE   Diabetes mellitus without complication (HCC)    Hyperlipidemia    Hypertension    Stroke (HCC) 1990'S   MINI STROKE    Assessment: Pt is a 74 yo male presenting to ED c/o SOB and some chest tightness, found with elevated BNP and Troponin levels.  Goal of Therapy:  Heparin  level 0.3-0.7 units/ml Monitor platelets by anticoagulation protocol: Yes   Plan:  Bolus 4000 units x 1 Start heparin  infusion at 950 units/hr Will check HL in 8 hr after start of infusion CBC daily while on heparin   Rankin CANDIE Dills, PharmD, [facility] 11/10/2024 3:55 AM      [1] No Known Allergies

## 2024-11-10 NOTE — ED Triage Notes (Signed)
 Pt arrived by EMS from home originally for chest pain.  Started 30 minutes priors to arrival - mid sternal sharp pain.  EMS arrived and Pt was in respiratory distress.  Rales heard throughout bilaterally.  Nitroglycerin  spray sublingal x2.  18LAC  80% on RA.  180/112.  ST

## 2024-11-10 NOTE — Assessment & Plan Note (Signed)
 Will continue statin therapy

## 2024-11-10 NOTE — Assessment & Plan Note (Addendum)
-   Last 2D echo showed an EF of 65% with grade 1 diastolic dysfunction. - The patient will be diuresed with IV Lasix . - Cardiology consult will be obtained. - I notified [facility] group

## 2024-11-10 NOTE — Progress Notes (Signed)
 ANTICOAGULATION CONSULT NOTE  Pharmacy Consult for heparin  infusion Indication: ACS/STEMI  Allergies[1]  Patient Measurements: Height: 5' 6 (167.6 cm) Weight: 72.3 kg (159 lb 6.3 oz) IBW/kg (Calculated) : 63.8 HEPARIN  DW (KG): 72.3  Vital Signs: Temp: 98.4 F (36.9 C) (02/06 0950) Temp Source: Oral (02/06 0950) BP: 165/87 (02/06 0950) Pulse Rate: 96 (02/06 0950)  Labs: Recent Labs    11/10/24 0202 11/10/24 0621 11/10/24 1241  HGB 13.4 12.3*  --   HCT 42.4 38.4*  --   PLT 288 236  --   APTT 28  --   --   LABPROT 12.9  --   --   INR 0.9  --   --   HEPARINUNFRC  --   --  >1.10*  CREATININE 3.06* 3.06*  --     Estimated Creatinine Clearance: 19.1 mL/min (A) (by C-G formula based on SCr of 3.06 mg/dL (H)).   Medical History: Past Medical History:  Diagnosis Date   Alcohol abuse    Cocaine abuse (HCC)    Diabetes mellitus without complication (HCC)    Hyperlipidemia    Hypertension    Prostate cancer (HCC)    Stroke (HCC) 1990'S   MINI STROKE   Tobacco abuse    Medications: No PTA anticoagulation  Assessment: Pt is a 74 yo male presenting to ED c/o SOB and some chest tightness, found with elevated BNP and Troponin levels.   Troponin 150>332>719.  Baseline hgb 12.3, plt 236, aPTT 28, and INR 0.9.  Goal of Therapy:  Heparin  level 0.3-0.7 units/ml Monitor platelets by anticoagulation protocol: Yes   Date   Time    HL      Rate/comment 2/06    1241   >1.10  SUPRAtherapeutic @ 950 units/hr  Plan:  Heparin  level supratherapeutic Confirmed with RN no signs or symptoms of bleeding. Level was drawn from same arm as infusion - infusion was paused, and first collection wasted. Will hold heparin  infusion for one hour, then resume infusion at 750 units/hr Will check HL in 8 hr after start of infusion, and daily while on heparin  CBC daily while on heparin   Leonor JAYSON Argyle, PharmD Pharmacy Resident  11/10/2024 1:59 PM        [1] No Known Allergies

## 2024-11-10 NOTE — Assessment & Plan Note (Addendum)
-   The patient will be continued on IV heparin . - High-dose statin therapy will be provided. - If he has chest pain as needed IV morphine  sulfate will be given. - Will continue aspirin . - Will continue Nitropaste. - 2D echo will be obtained. - Cardiology consult will be obtained as mentioned above.
